# Patient Record
Sex: Female | Born: 1990 | Race: Black or African American | Hispanic: No | Marital: Married | Smoking: Never smoker
Health system: Southern US, Community
[De-identification: ages and names within clinical notes are randomized; demographics above are authoritative.]

## PROBLEM LIST (undated history)

## (undated) DIAGNOSIS — F329 Major depressive disorder, single episode, unspecified: Secondary | ICD-10-CM

## (undated) DIAGNOSIS — F909 Attention-deficit hyperactivity disorder, unspecified type: Secondary | ICD-10-CM

## (undated) DIAGNOSIS — F419 Anxiety disorder, unspecified: Secondary | ICD-10-CM

## (undated) DIAGNOSIS — Z9889 Other specified postprocedural states: Secondary | ICD-10-CM

## (undated) DIAGNOSIS — Z8489 Family history of other specified conditions: Secondary | ICD-10-CM

## (undated) DIAGNOSIS — Z6841 Body Mass Index (BMI) 40.0 and over, adult: Secondary | ICD-10-CM

## (undated) DIAGNOSIS — F32A Depression, unspecified: Secondary | ICD-10-CM

## (undated) DIAGNOSIS — R519 Headache, unspecified: Secondary | ICD-10-CM

## (undated) DIAGNOSIS — R112 Nausea with vomiting, unspecified: Secondary | ICD-10-CM

## (undated) DIAGNOSIS — T8859XA Other complications of anesthesia, initial encounter: Secondary | ICD-10-CM

## (undated) DIAGNOSIS — K589 Irritable bowel syndrome without diarrhea: Secondary | ICD-10-CM

## (undated) DIAGNOSIS — R51 Headache: Secondary | ICD-10-CM

## (undated) HISTORY — DX: Attention-deficit hyperactivity disorder, unspecified type: F90.9

---
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
## 2012-08-31 DIAGNOSIS — Z6791 Unspecified blood type, Rh negative: Secondary | ICD-10-CM | POA: Insufficient documentation

## 2012-12-21 ENCOUNTER — Inpatient Hospital Stay: Payer: Self-pay | Admitting: Obstetrics and Gynecology

## 2012-12-21 LAB — CBC WITH DIFFERENTIAL/PLATELET
Basophil #: 0.1 10*3/uL (ref 0.0–0.1)
Basophil %: 1.2 %
Eosinophil #: 0.1 10*3/uL (ref 0.0–0.7)
Eosinophil %: 0.5 %
HCT: 37 % (ref 35.0–47.0)
HGB: 13 g/dL (ref 12.0–16.0)
Lymphocyte #: 2.5 10*3/uL (ref 1.0–3.6)
Lymphocyte %: 23.8 %
MCH: 32.6 pg (ref 26.0–34.0)
MCHC: 35 g/dL (ref 32.0–36.0)
MCV: 93 fL (ref 80–100)
Monocyte #: 1.4 x10 3/mm — ABNORMAL HIGH (ref 0.2–0.9)
Monocyte %: 13.1 %
Neutrophil #: 6.4 10*3/uL (ref 1.4–6.5)
Neutrophil %: 61.4 %
Platelet: 271 10*3/uL (ref 150–440)
RBC: 3.98 10*6/uL (ref 3.80–5.20)
RDW: 15 % — ABNORMAL HIGH (ref 11.5–14.5)
WBC: 10.4 10*3/uL (ref 3.6–11.0)

## 2012-12-21 LAB — GC/CHLAMYDIA PROBE AMP

## 2012-12-23 LAB — COMPREHENSIVE METABOLIC PANEL
Alkaline Phosphatase: 171 U/L — ABNORMAL HIGH (ref 50–136)
Anion Gap: 6 — ABNORMAL LOW (ref 7–16)
BUN: 9 mg/dL (ref 7–18)
EGFR (Non-African Amer.): >60
Glucose: 72 mg/dL (ref 65–99)
Osmolality: 273 (ref 275–301)
Potassium: 4.1 mmol/L (ref 3.5–5.1)
SGOT(AST): 20 U/L (ref 15–37)
SGPT (ALT): 18 U/L (ref 12–78)
Sodium: 138 mmol/L (ref 136–145)
Total Protein: 5.1 g/dL — ABNORMAL LOW (ref 6.4–8.2)

## 2012-12-23 LAB — CBC WITH DIFFERENTIAL/PLATELET
Basophil %: 0.5 %
Eosinophil #: 0 10*3/uL (ref 0.0–0.7)
Eosinophil %: 0.3 %
HCT: 32.1 % — ABNORMAL LOW (ref 35.0–47.0)
Lymphocyte #: 1.9 10*3/uL (ref 1.0–3.6)
Lymphocyte %: 12.8 %
MCH: 31.9 pg (ref 26.0–34.0)
MCV: 94 fL (ref 80–100)
Monocyte #: 1.4 x10 3/mm — ABNORMAL HIGH (ref 0.2–0.9)
Monocyte %: 9.5 %
Neutrophil #: 11.3 10*3/uL — ABNORMAL HIGH (ref 1.4–6.5)
Neutrophil %: 76.9 %
Platelet: 240 10*3/uL (ref 150–440)
RBC: 3.41 10*6/uL — ABNORMAL LOW (ref 3.80–5.20)
RDW: 15.1 % — ABNORMAL HIGH (ref 11.5–14.5)

## 2012-12-25 LAB — CREATININE, SERUM: Creatinine: 0.86 mg/dL (ref 0.60–1.30)

## 2013-02-04 HISTORY — PX: OTHER SURGICAL HISTORY: SHX169

## 2013-03-24 ENCOUNTER — Encounter: Payer: Self-pay | Admitting: Family Medicine

## 2013-06-06 ENCOUNTER — Emergency Department: Payer: Self-pay | Admitting: Emergency Medicine

## 2013-11-08 ENCOUNTER — Emergency Department: Payer: Self-pay | Admitting: Emergency Medicine

## 2014-08-27 NOTE — Consult Note (Signed)
Brief Consult Note: Diagnosis: adjustment disorder.   Patient was seen by consultant.   Consult note dictated.   Comments: Psychiatry: Patient seen and chart reviewed. Patient's cheif complaint is of pain which is her stated reason for not being more active. Denies any psychiatric symptoms. No sign of dangerousness. No specific indication for treatment.  Electronic Signatures: Audery Amellapacs, Loyde Orth T (MD)  (Signed 20-Aug-14 17:41)  Authored: Brief Consult Note   Last Updated: 20-Aug-14 17:41 by Audery Amellapacs, Gurley Climer T (MD)

## 2014-08-27 NOTE — Op Note (Signed)
PATIENT NAME:  Debra Mcdaniel, Lashonda N MR#:  [id number] DATE OF BIRTH:  08-12-1990  DATE OF PROCEDURE:  12/22/2012  PREOPERATIVE DIAGNOSES:   1.  Term pregnancy.  2.  Failure to progress secondary to cephalopelvic disproportion.   POSTOPERATIVE DIAGNOSES: 1.  Term pregnancy.  2.  Failure to progress secondary to cephalopelvic disproportion.   PROCEDURE PERFORMED: Low transverse cesarean section.   SURGEON: Annamarie MajorPaul Molleigh Huot, M.D.   ASSISTANT: Kandice HamsAmanda Ore, technician.   ANESTHESIA: Epidural.   ESTIMATED BLOOD LOSS: 700 mL.   COMPLICATIONS: None.   FINDINGS: Normal tubes, ovaries, and uterus. Viable female infant weighing 8 pounds, 10 ounces with Apgar scores of 8 and 9 at one minute and five minutes respectively.   DISPOSITION: To the recovery room in stable condition.   TECHNIQUE: The patient was prepped and draped in the usual sterile fashion after adequate anesthesia is obtained in the supine position on the operating room table. Scalpel was used to create a low transverse skin incision down to the level of the rectus fascia, which is dissected bilaterally using Mayo scissors. The rectus muscles are dissected away from the rectus fascia and are separated in the midline with the peritoneum dissected and the bladder inferiorly retracted. A scalpel was used to create a low transverse hysterotomy incision that is extended by blunt dissection. Amniotomy reveals clear fluid. The head is delivered without complication and without the use of a vacuum device. The oropharynx is suctioned and the remaining infant is delivered completely.   Cord blood is obtained. The placenta is manually extracted. The uterus is externalized and cleansed of all membranes and debris using a moist sponge. The hysterotomy incision is closed with a running #1 Vicryl suture in a locking fashion followed by a second layer to imbricate the first layer to obtain excellent hemostasis.   The uterus was placed back in the  intra-abdominal cavity and the paracolic gutters are irrigated using warm saline. Re-examination of the incision reveals excellent hemostasis and Interceed is placed over the scar. The peritoneum is closed and then trocars are placed through the abdomen into the subfascial space for placement of the On-Q pain pump catheters. They are threaded through these trocars into place. The fascia is closed with Maxon suture in a running fashion with careful placement of sutures to not incorporate the On-Q pain pump catheters. Subcutaneous tissues are irrigated and hemostasis is assured using electrocautery. Skin is closed with surgical clips. Bandages are applied. The catheters are flushed with 5 mL each of bupivacaine and stabilized in place with Steri-Strips and a Tegaderm bandage. The patient goes to the recovery room in stable condition. All sponge, instrument and needle counts are correct.   ____________________________ R. Annamarie MajorPaul Dartagnan Beavers, MD rph:aw D: 12/22/2012 04:37:46 ET T: 12/22/2012 07:45:49 ET JOB#: [id number]  cc: Dierdre Searles. Paul Tryce Surratt, MD, <Dictator> Nadara MustardOBERT P Matea Stanard MD ELECTRONICALLY SIGNED 12/26/2012 10:32

## 2014-08-27 NOTE — Consult Note (Signed)
PATIENT NAME:  Debra Mcdaniel, Debra Mcdaniel MR#:  [id number] DATE OF BIRTH:  23-Apr-1991  DATE OF CONSULTATION:  12/24/2012  CONSULTING PHYSICIAN:  Audery AmelJohn T. Clapacs, MD  IDENTIFYING INFORMATION AND REASON FOR CONSULTATION: This is a 24 year old woman in the hospital to have a baby by cesarean section. The consultation was because of concern that she is not being active either in her exercise or in holding the baby.   HISTORY OF PRESENT ILLNESS: I received a call this morning from one of the midwives requesting a consultation on this patient. There was stated concern that the patient has not been getting out of bed and not moving around the way that was recommended to her. There was concern that the patient had not been making any effort to hold her newborn baby. On interview today, the patient denies any psychiatric symptoms. She states that her mood is feeling okay, but that her chief complaint is pain. She says that she finds the pain in the area of her incision and also pain in her arm to be so much that she can hardly bear it at times. She gives that as a reason why she has not been more active. She says that the first day after having the cesarean section, she felt like the pain around the incision was so bad that she simply could not get out of bed on her own. She also says that when she tried to hold the baby, her arm quickly tired out and started to hurt and so she felt that she could not hold the baby much longer. She states that otherwise, she would like to be able to hold her baby. She denies any symptoms or feeling of depression. States that her mood is generally upbeat. Denies any anxiety symptoms. Denies any psychotic symptoms. She says she is upset at being in the hospital and is upset that she has not been able to sleep well. Prior to coming into the hospital, however, she says that she was sleeping fairly well at home. 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 She had  continued to have an interest in her normal activities at home. She feels like she has good relationships with her family. She absolutely denies any suicidal ideation or homicidal ideation at all. She says that she was looking forward to having the baby and that she is glad that the baby is born. She is looking forward to trying to be a good mother for the baby.   PAST PSYCHIATRIC HISTORY: The patient denies any prior psychiatric treatment or history whatsoever. Says that she has never seen even a counselor or therapist. Never been on any psychiatric medicine. Never been in a psychiatric hospital. Denies any history of suicide attempts or violent behavior in the past.   SOCIAL HISTORY: The patient is single and lives with her mother and grandmother, as well as a sister and the sister's child. This is the patient's first baby. The father of the baby is not involved in her life anymore. The patient is going to school for a cosmetology degree. She says she feels like she has good support from her family and other friends.   PAST MEDICAL HISTORY: Denies any significant ongoing medical problems. She specifically says that she has never been in a hospital before and never had any surgery before. She was not expecting to have to have a cesarean and feels like the surprise of it has made the experience even worse. She is  not on any medication.    ALLERGIES: No medication allergies listed.   FAMILY HISTORY: Denies any family history of mental health problems.   SUBSTANCE ABUSE HISTORY: The patient says that she drinks very little. She says that she has never had a problem with drinking and does not use any kind of recreational drugs. Denies any history of substance abuse problems in the past.   MENTAL STATUS EXAMINATION: This is a 24 year old woman who just had a cesarean section the day before yesterday. She was interviewed in the room. The baby was in the room also in a bassinet underneath an ultraviolet  light. The patient was clearly aware that the baby was there. The patient was alert, oriented and cooperative with the interview. She did not express any irritability or anger about the consultation. She was interviewed on her own. Eye contact was appropriate. Psychomotor activity was appropriate given her stated degree of discomfort. Speech was of normal rate, tone and volume. Affect appeared to be full range, reactive and appropriate. Mood was stated as being tired. Thoughts appeared to be lucid. There was no sign of odd or bizarre thinking or loosening of associations. No evidence of any delusions or paranoia. Denied hallucinations. Totally denied suicidal ideation. Denied homicidal ideation and denied any thought of wanting to hurt the baby. The patient appeared to be of probably normal intelligence. Was alert and oriented x 4.   LABORATORY RESULTS: Laboratory work-up includes an RPR which is negative, a work-up for Chlamydia and gonorrhea which is negative. She does not have a drug screen that was checked. Nothing else relevant to the psychiatric condition.   ASSESSMENT: This is a 24 year old woman who had a baby 2 days ago. This is her first child. She has no past psychiatric history. The treatment team has concern that she is not being active enough in recovering her strength and not making more of an effort to be with the baby. On interview today, the patient's stated reason for all of this is that she has been in pain. She says that today the pain is a little better than yesterday and that she has been able to get out of bed and go to the bathroom. She tells me that she in general cannot tolerate pain very well and that she thinks she has a particularly low tolerance for it. On interview today, I do not see any evidence of mood disorder. No evidence of psychotic disorder. No evidence of anxiety disorder. We do not have any evidence of substance abuse problems. She appears to be of normal range  intelligence. I do not see anything to indicate any maliciousness. It is possible that this is just the patient's personality or developmental level, but I do not see any evidence of any treatable psychiatric problem.   TREATMENT PLAN: No indication for specific psychiatric treatment. Recommend particular attention to education and encouragement for the patient, particularly would suggest that she be offered as much support by whatever resources are available during her hospitalization and after discharge.   DIAGNOSIS, PRINCIPAL AND PRIMARY:  AXIS I: Adjustment disorder, not otherwise specified.   SECONDARY DIAGNOSES: AXIS I: No further diagnosis.  AXIS II: No diagnosis.  AXIS III: Status post birth of a child. Also getting treatment for an infection. Anemia.  AXIS IV: Moderate to severe from being a single mother, decreased overall support.  AXIS V: Functioning at time of evaluation: 66.   ____________________________ Audery Amel, MD jtc:jm D: 12/24/2012 17:53:02 ET T: 12/24/2012  21:02:38 ET JOB#: [id number]  cc: Audery Amel, MD, <Dictator> Audery Amel MD ELECTRONICALLY SIGNED 12/24/2012 23:22

## 2014-09-14 NOTE — H&P (Signed)
L&D Evaluation:  History Expanded:  HPI Patient presents to Labor and Delivery this afternoon after reporting that her water broke around 9am this morning. Patient reports contractions that are getting more intense. Denies vaginal bleeding, +  fetal movement.   Gravida 1   Term 0   PreTerm 0   Abortion 0   Living 0   Blood Type (Maternal) O negative   Group B Strep Results Maternal (Result >5wks must be treated as unknown) negative   Maternal HIV Negative   Maternal Syphilis Ab Nonreactive   Maternal Varicella Immune   Rubella Results (Maternal) immune   Maternal T-Dap Immune   [facility]   Presents with contractions, leaking fluid   Patient's Medical History No Chronic Illness   Patient's Surgical History none   Medications Pre Natal Vitamins   Allergies NKDA   Social History none   Family History other  hypothyroidism, endometriosis   Current Prenatal Course Notable For late to care, polyhydramnios at 36 weeks   ROS:  ROS All systems were reviewed.  HEENT, CNS, GI, GU, Respiratory, CV, Renal and Musculoskeletal systems were found to be normal.   Exam:  Vital Signs stable   General no apparent distress   Mental Status clear   Chest clear   Heart normal sinus rhythm   Abdomen gravid, non-tender   Edema 1+   Pelvic 2-3/70/-3   Mebranes Ruptured, at 9am 12-21-12/ nitrazine and fern positive   Description clear   FHT normal rate with no decels   Fetal Heart Rate 150   Ucx irregular   Skin dry   Impression:  Impression early labor with spontaneous rupture of membranes   Plan:  Plan monitor contractions and for cervical change, fluids   Comments Start pitocin for augmentation  epidural for pain relief as desired Anticipate spontaneous vaginal delivery.   Electronic Signatures: Letitia LibraHarris, Kaeli Nichelson Paul (MD)  (Signed 17-Aug-14 15:19)  Authored: L&D Evaluation   Last Updated: 17-Aug-14 15:19 by Letitia LibraHarris, Itsel Opfer Paul (MD)

## 2016-10-04 ENCOUNTER — Ambulatory Visit (INDEPENDENT_AMBULATORY_CARE_PROVIDER_SITE_OTHER): Payer: Managed Care, Other (non HMO) | Admitting: Obstetrics and Gynecology

## 2016-10-04 ENCOUNTER — Encounter: Payer: Self-pay | Admitting: Obstetrics and Gynecology

## 2016-10-04 VITALS — BP 132/70 | HR 100 | Ht 62.0 in | Wt 287.0 lb

## 2016-10-04 DIAGNOSIS — Z01419 Encounter for gynecological examination (general) (routine) without abnormal findings: Secondary | ICD-10-CM

## 2016-10-04 DIAGNOSIS — Z124 Encounter for screening for malignant neoplasm of cervix: Secondary | ICD-10-CM

## 2016-10-04 DIAGNOSIS — Z113 Encounter for screening for infections with a predominantly sexual mode of transmission: Secondary | ICD-10-CM

## 2016-10-04 DIAGNOSIS — Z30432 Encounter for removal of intrauterine contraceptive device: Secondary | ICD-10-CM

## 2016-10-04 NOTE — Progress Notes (Signed)
Chief Complaint  Patient presents with  . Gynecologic Exam  . IUD removal     HPI:      Debra Mcdaniel is a 26 y.o. G1P1001 who LMP was Patient's last menstrual period was 09/11/2016 (exact date)., presents today for her annual examination.  Her menses are mostly regular every 28-30 days, lasting 7 days and light flow.  Dysmenorrhea mild, occurring first 1-2 days of flow. She does not have intermenstrual bleeding.  Sex activity: single partner, contraception - IUD. Mirena placed 10/14. Wants it removed today. Last Pap: February 05, 2013  Results were: no abnormalities Hx of STDs: none  There is no FH of breast cancer. There is no FH of ovarian cancer. The patient does do self-breast exams.  Tobacco use: The patient denies current or previous tobacco use. Alcohol use: social drinker Exercise: moderately active  She does not get adequate calcium and Vitamin D in her diet.   History reviewed. No pertinent past medical history.  Past Surgical History:  Procedure Laterality Date  . MIrena   02/2013    Family History  Problem Relation Age of Onset  . Hypertension Mother   . Hypothyroidism Mother   . Hypertension Maternal Grandmother   . Hypertension Maternal Grandfather   . Hypertension Paternal Grandmother   . Hypertension Paternal Grandfather     Social History   Social History  . Marital status: Single    Spouse name: N/A  . Number of children: N/A  . Years of education: N/A   Occupational History  . Not on file.   Social History Main Topics  . Smoking status: Never Smoker  . Smokeless tobacco: Never Used  . Alcohol use Yes     Comment: occ  . Drug use: No  . Sexual activity: Yes    Birth control/ protection: IUD   Other Topics Concern  . Not on file   Social History Narrative  . No narrative on file    No current outpatient prescriptions on file.  ROS:  Review of Systems  Constitutional: Negative for fatigue, fever and unexpected weight  change.  Respiratory: Negative for cough, shortness of breath and wheezing.   Cardiovascular: Negative for chest pain, palpitations and leg swelling.  Gastrointestinal: Negative for blood in stool, constipation, diarrhea, nausea and vomiting.  Endocrine: Negative for cold intolerance, heat intolerance and polyuria.  Genitourinary: Negative for dyspareunia, dysuria, flank pain, frequency, genital sores, hematuria, menstrual problem, pelvic pain, urgency, vaginal bleeding, vaginal discharge and vaginal pain.  Musculoskeletal: Negative for back pain, joint swelling and myalgias.  Skin: Negative for rash.  Neurological: Negative for dizziness, syncope, light-headedness, numbness and headaches.  Hematological: Negative for adenopathy.  Psychiatric/Behavioral: Negative for agitation, confusion, sleep disturbance and suicidal ideas. The patient is nervous/anxious.      Objective: BP 132/70 (BP Location: Left Arm, Patient Position: Sitting, Cuff Size: Large)   Pulse 100   Ht 5\' 2"  (1.575 m)   Wt 287 lb (130.2 kg)   LMP 09/11/2016 (Exact Date)   BMI 52.49 kg/m    Physical Exam  Constitutional: She is oriented to person, place, and time. She appears well-developed and well-nourished.  Genitourinary: Vagina normal and uterus normal. There is no rash or tenderness on the right labia. There is no rash or tenderness on the left labia. No erythema or tenderness in the vagina. No vaginal discharge found. Right adnexum does not display mass and does not display tenderness. Left adnexum does not display mass and does  not display tenderness.  Cervix exhibits visible IUD strings. Cervix does not exhibit motion tenderness or polyp. Uterus is not enlarged or tender.  Neck: Normal range of motion. No thyromegaly present.  Cardiovascular: Normal rate, regular rhythm and normal heart sounds.   No murmur heard. Pulmonary/Chest: Effort normal and breath sounds normal. Right breast exhibits no mass, no nipple  discharge, no skin change and no tenderness. Left breast exhibits no mass, no nipple discharge, no skin change and no tenderness.  Abdominal: Soft. There is no tenderness. There is no guarding.  Musculoskeletal: Normal range of motion.  Neurological: She is alert and oriented to person, place, and time. No cranial nerve deficit.  Psychiatric: She has a normal mood and affect. Her behavior is normal.  Vitals reviewed.   Assessment/Plan: Encounter for annual routine gynecological examination  Cervical cancer screening - Plan: IGP,CtNgTv,rfx Aptima HPV ASCU  Screening for STD (sexually transmitted disease) - Plan: IGP,CtNgTv,rfx Aptima HPV ASCU  Encounter for removal of intrauterine contraceptive device (IUD) - IUD removed. Pt wants to use condoms in the meantime.             GYN counsel family planning choices, adequate intake of calcium and vitamin D     F/U  Return in about 1 year (around 10/04/2017).  Alicia B. Copland, PA-C 10/04/2016 9:21 AM     IUD REMOVAL PROCEDURE   History of Present Illness:  Debra Mcdaniel is a 26 y.o. that had a Mirena IUD placed approximately 4 years ago. Since that time, she has had irreg bleeding, anxiety/depression sx and wants it removed. She plans to use condoms only in the meantime.   BP 132/70 (BP Location: Left Arm, Patient Position: Sitting, Cuff Size: Large)   Pulse 100   Ht 5\' 2"  (1.575 m)   Wt 287 lb (130.2 kg)   LMP 09/11/2016 (Exact Date)   BMI 52.49 kg/m   Pelvic exam:  Two IUD strings present seen coming from the cervical os. EGBUS, vaginal vault and cervix: within normal limits  IUD Removal Strings of IUD identified and grasped.  IUD removed without problem with ring forceps.  Pt tolerated this well.  IUD noted to be intact.  Assessment:  IUD Removal   Plan: IUD removed and plan for contraception is condoms. She was amenable to this plan.  Alicia B. Copland, PA-C 10/04/2016 9:21 AM

## 2016-10-07 LAB — IGP,CTNGTV,RFX APTIMA HPV ASCU
CHLAMYDIA, NUC. ACID AMP: NEGATIVE
Gonococcus, Nuc. Acid Amp: NEGATIVE
PAP Smear Comment: 0
Trich vag by NAA: NEGATIVE

## 2016-10-08 ENCOUNTER — Encounter: Payer: Self-pay | Admitting: Obstetrics and Gynecology

## 2016-10-09 ENCOUNTER — Telehealth: Payer: Self-pay

## 2016-10-09 NOTE — Telephone Encounter (Signed)
Spoke w/pt. She started spotting soon after having IUD removed & then had what she believes is her cycle w/clots. 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 Pt has felt dizzy and has had chills, body aches at times. Questioned whether she is having any other s&s of sinus/head cold etc. Pt uncertain states she could be coming down w/something. Advised to monitor bleeding & report if having to change pad more than 1x qhr or having extreme pelvic pain. [facility] & contact pt w/further advice.

## 2016-10-09 NOTE — Telephone Encounter (Signed)
Pt has some post procedure questions about Mirena removal she just had done by [facility]. Cb#[phone]

## 2016-10-09 NOTE — Telephone Encounter (Signed)
[facility] Follow expect[facility]. Doubt chills, body aches, dizziness related. RN to discuss with pt.

## 2016-10-10 NOTE — Telephone Encounter (Signed)
LMTC.  Pt aware.

## 2016-10-15 ENCOUNTER — Ambulatory Visit: Payer: Self-pay | Admitting: Obstetrics & Gynecology

## 2017-05-17 DIAGNOSIS — K580 Irritable bowel syndrome with diarrhea: Secondary | ICD-10-CM | POA: Insufficient documentation

## 2017-06-14 ENCOUNTER — Other Ambulatory Visit: Payer: Self-pay | Admitting: Nurse Practitioner

## 2017-06-14 DIAGNOSIS — G43719 Chronic migraine without aura, intractable, without status migrainosus: Secondary | ICD-10-CM

## 2017-06-16 ENCOUNTER — Emergency Department
Admission: EM | Admit: 2017-06-16 | Discharge: 2017-06-16 | Disposition: A | Discharge status: Home / Self Care | Payer: Managed Care, Other (non HMO) | Attending: Emergency Medicine | Admitting: Emergency Medicine

## 2017-06-16 ENCOUNTER — Other Ambulatory Visit: Payer: Self-pay

## 2017-06-16 DIAGNOSIS — X58XXXA Exposure to other specified factors, initial encounter: Secondary | ICD-10-CM | POA: Diagnosis not present

## 2017-06-16 DIAGNOSIS — Y929 Unspecified place or not applicable: Secondary | ICD-10-CM | POA: Insufficient documentation

## 2017-06-16 DIAGNOSIS — Y939 Activity, unspecified: Secondary | ICD-10-CM | POA: Insufficient documentation

## 2017-06-16 DIAGNOSIS — S39012A Strain of muscle, fascia and tendon of lower back, initial encounter: Secondary | ICD-10-CM | POA: Diagnosis not present

## 2017-06-16 DIAGNOSIS — S3992XA Unspecified injury of lower back, initial encounter: Secondary | ICD-10-CM | POA: Diagnosis present

## 2017-06-16 DIAGNOSIS — Y999 Unspecified external cause status: Secondary | ICD-10-CM | POA: Insufficient documentation

## 2017-06-16 MED ORDER — CYCLOBENZAPRINE HCL 5 MG PO TABS: 5.0000 mg | ORAL_TABLET | Freq: Three times a day (TID) | ORAL | 0 refills | Status: DC | PRN

## 2017-06-16 MED ORDER — DEXAMETHASONE SODIUM PHOSPHATE 10 MG/ML IJ SOLN
10.0000 mg | Freq: Once | INTRAMUSCULAR | Status: AC
  Administered 2017-06-16: 10 mg via INTRAMUSCULAR
  Filled 2017-06-16: qty 1

## 2017-06-16 MED ORDER — KETOROLAC TROMETHAMINE 30 MG/ML IJ SOLN
30.0000 mg | Freq: Once | INTRAMUSCULAR | Status: AC
  Administered 2017-06-16: 30 mg via INTRAMUSCULAR
  Filled 2017-06-16: qty 1

## 2017-06-16 MED ORDER — CYCLOBENZAPRINE HCL 10 MG PO TABS
10.0000 mg | ORAL_TABLET | Freq: Once | ORAL | Status: AC
  Administered 2017-06-16: 10 mg via ORAL
  Filled 2017-06-16: qty 1

## 2017-06-16 MED ORDER — PREDNISONE 10 MG PO TABS: 10.0000 mg | ORAL_TABLET | Freq: Every day | ORAL | 0 refills | Status: DC

## 2017-06-16 MED ORDER — TRAMADOL HCL 50 MG PO TABS: 50.0000 mg | ORAL_TABLET | Freq: Four times a day (QID) | ORAL | 0 refills | Status: DC | PRN

## 2017-06-16 NOTE — ED Provider Notes (Signed)
[facility] EMERGENCY DEPARTMENT Provider Note   CSN: [id number] Arrival date & time: 06/16/17  1304     History   Chief Complaint Chief Complaint  Patient presents with  . Back Pain    HPI Debra Mcdaniel is a 27 y.o. female presents to the emergency department for evaluation of acute lower back pain.  Symptoms have been present since yesterday.  No trauma or injury.  She describes sharp pulling pain along the left and right side of her lower lumbar spine after bending.  She states her back locked up.  She is been taken Tylenol as needed for pain.  Pain is 6 out of 10 with sitting and 9 out of 10 with movement.  She denies any numbness tingling or radicular symptoms.  No loss of bowel or bladder symptoms.  She is ambulatory with no assistive devices but has an antalgic gait.  No history of back troubles.  No urinary symptoms, fevers, nausea vomiting or abdominal pain.  HPI  History reviewed. No pertinent past medical history.  There are no active problems to display for this patient.   History reviewed. No pertinent surgical history.  OB History    No data available       Home Medications    Prior to Admission medications   Medication Sig Start Date End Date Taking? Authorizing Provider  cyclobenzaprine (FLEXERIL) 5 MG tablet Take 1-2 tablets (5-10 mg total) by mouth 3 (three) times daily as needed for muscle spasms. 06/16/17   Evon Slack, PA-C  predniSONE (DELTASONE) 10 MG tablet Take 1 tablet (10 mg total) by mouth daily. 6,5,4,3,2,1 six day taper 06/16/17   Evon Slack, PA-C  traMADol (ULTRAM) 50 MG tablet Take 1 tablet (50 mg total) by mouth every 6 (six) hours as needed. 06/16/17   Evon Slack, PA-C    Family History No family history on file.  Social History Social History   Tobacco Use  . Smoking status: Never Smoker  . Smokeless tobacco: Never Used  Substance Use Topics  . Alcohol use: No    Frequency: Never  . Drug  use: No     Allergies   Patient has no known allergies.   Review of Systems Review of Systems  Constitutional: Negative for activity change, chills, fatigue and fever.  Eyes: Negative for visual disturbance.  Respiratory: Negative for cough, chest tightness and shortness of breath.   Cardiovascular: Negative for chest pain and leg swelling.  Gastrointestinal: Negative for abdominal pain, nausea and vomiting.  Genitourinary: Negative for dysuria.  Musculoskeletal: Positive for back pain and myalgias. Negative for arthralgias and gait problem.  Skin: Negative for rash.  Neurological: Negative for weakness, numbness and headaches.  Hematological: Negative for adenopathy.  Psychiatric/Behavioral: Negative for agitation, behavioral problems and confusion.     Physical Exam Updated Vital Signs BP 128/80 (BP Location: Left Arm)   Pulse 96   Temp 98.4 F (36.9 C) (Oral)   Ht 5\' 1"  (1.549 m)   Wt (!) 143.8 kg (317 lb)   LMP 05/20/2017   SpO2 99%   BMI 59.90 kg/m   Physical Exam  Constitutional: She is oriented to person, place, and time. She appears well-developed and well-nourished.  HENT:  Head: Normocephalic and atraumatic.  Eyes: Conjunctivae are normal.  Neck: Normal range of motion.  Cardiovascular: Normal rate.  Pulmonary/Chest: Effort normal. No respiratory distress.  Musculoskeletal:  Examination lumbar spine shows patient has no spinous process tenderness.  She has  left and right paravertebral muscle tenderness of the lumbar spine.  She has full range of motion of bilateral lower extremities of the hips knees and ankles with no discomfort.  She is able to stand with mild antalgic gait.  Ambulation significantly improved with Toradol, dexamethasone  Neurological: She is alert and oriented to person, place, and time.  Skin: Skin is warm. No rash noted.  Psychiatric: She has a normal mood and affect. Her behavior is normal. Thought content normal.     ED Treatments  / Results  Labs (all labs ordered are listed, but only abnormal results are displayed) Labs Reviewed - No data to display  EKG  EKG Interpretation None       Radiology No results found.  Procedures Procedures (including critical care time)  Medications Ordered in ED Medications  ketorolac (TORADOL) 30 MG/ML injection 30 mg (30 mg Intramuscular Given 06/16/17 1515)  dexamethasone (DECADRON) injection 10 mg (10 mg Intramuscular Given 06/16/17 1514)  cyclobenzaprine (FLEXERIL) tablet 10 mg (10 mg Oral Given 06/16/17 1513)     Initial Impression / Assessment and Plan / ED Course  I have reviewed the triage vital signs and the nursing notes.  Pertinent labs & imaging results that were available during my care of the patient were reviewed by me and considered in my medical decision making (see chart for details).     27 year old female with acute lower back pain.  No numbness tingling or radicular symptoms.  No neurological deficits.  Signs and symptoms consistent with lumbar muscular strain.  Recommend patient continue with Tylenol.  She is given prescription for Flexeril, tramadol and a 6-day steroid taper.  She will follow-up with orthopedics if no improvement in 1 week.  Final Clinical Impressions(s) / ED Diagnoses   Final diagnoses:  Strain of lumbar region, initial encounter    ED Discharge Orders        Ordered    traMADol (ULTRAM) 50 MG tablet  Every 6 hours PRN     06/16/17 1558    cyclobenzaprine (FLEXERIL) 5 MG tablet  3 times daily PRN     06/16/17 1558    predniSONE (DELTASONE) 10 MG tablet  Daily     06/16/17 1558       Ronnette JuniperGaines, Aldrick Derrig C, PA-C 06/16/17 1632    Merrily Brittleifenbark, Neil, MD 06/17/17 [phone]

## 2017-06-16 NOTE — Discharge Instructions (Signed)
Please avoid any heavy lifting pushing or pulling.  If any fevers, increasing pain numbness tingling or radicular symptoms return to the ED.  Follow-up with orthopedics if no improvement in 1 week.

## 2017-06-16 NOTE — ED Notes (Signed)

## 2017-06-16 NOTE — ED Triage Notes (Signed)
Pt c/o lower back pain since lifting his son from the bath tub last night

## 2017-06-17 ENCOUNTER — Encounter: Payer: Self-pay | Admitting: Obstetrics and Gynecology

## 2017-06-26 ENCOUNTER — Encounter (INDEPENDENT_AMBULATORY_CARE_PROVIDER_SITE_OTHER): Payer: Self-pay

## 2017-06-26 ENCOUNTER — Ambulatory Visit
Admission: RE | Admit: 2017-06-26 | Discharge: 2017-06-26 | Disposition: A | Discharge status: Home / Self Care | Payer: Managed Care, Other (non HMO) | Source: Ambulatory Visit | Attending: Nurse Practitioner | Admitting: Nurse Practitioner

## 2017-06-26 DIAGNOSIS — G43719 Chronic migraine without aura, intractable, without status migrainosus: Secondary | ICD-10-CM | POA: Diagnosis present

## 2017-06-26 MED ORDER — GADOBENATE DIMEGLUMINE 529 MG/ML IV SOLN
20.0000 mL | Freq: Once | INTRAVENOUS | Status: AC | PRN
  Administered 2017-06-26: 20 mL via INTRAVENOUS

## 2017-06-28 ENCOUNTER — Other Ambulatory Visit
Admission: RE | Admit: 2017-06-28 | Discharge: 2017-06-28 | Disposition: A | Discharge status: Home / Self Care | Payer: Managed Care, Other (non HMO) | Source: Ambulatory Visit | Attending: Student | Admitting: Student

## 2017-06-28 DIAGNOSIS — G8929 Other chronic pain: Secondary | ICD-10-CM | POA: Insufficient documentation

## 2017-06-28 DIAGNOSIS — R109 Unspecified abdominal pain: Secondary | ICD-10-CM | POA: Diagnosis present

## 2017-06-28 LAB — GASTROINTESTINAL PANEL BY PCR, STOOL (REPLACES STOOL CULTURE)
ADENOVIRUS F40/41: NOT DETECTED
Astrovirus: NOT DETECTED
Campylobacter species: NOT DETECTED
Cryptosporidium: NOT DETECTED
Cyclospora cayetanensis: NOT DETECTED
ENTEROAGGREGATIVE E COLI (EAEC): NOT DETECTED
ENTEROPATHOGENIC E COLI (EPEC): NOT DETECTED
ENTEROTOXIGENIC E COLI (ETEC): NOT DETECTED
Entamoeba histolytica: NOT DETECTED
GIARDIA LAMBLIA: NOT DETECTED
NOROVIRUS GI/GII: NOT DETECTED
Plesimonas shigelloides: NOT DETECTED
ROTAVIRUS A: NOT DETECTED
SHIGELLA/ENTEROINVASIVE E COLI (EIEC): NOT DETECTED
Salmonella species: NOT DETECTED
Sapovirus (I, II, IV, and V): NOT DETECTED
Shiga like toxin producing E coli (STEC): NOT DETECTED
Vibrio cholerae: NOT DETECTED
Vibrio species: NOT DETECTED
Yersinia enterocolitica: NOT DETECTED

## 2017-06-28 LAB — C DIFFICILE QUICK SCREEN W PCR REFLEX
C DIFFICILE (CDIFF) TOXIN: NEGATIVE
C DIFFICLE (CDIFF) ANTIGEN: NEGATIVE
C Diff interpretation: NOT DETECTED

## 2017-06-29 LAB — H. PYLORI ANTIGEN, STOOL: H. Pylori Stool Ag, Eia: NEGATIVE

## 2017-07-01 LAB — PANCREATIC ELASTASE, FECAL

## 2017-07-02 LAB — CALPROTECTIN, FECAL: Calprotectin, Fecal: 31 ug/g (ref 0–120)

## 2017-07-23 ENCOUNTER — Encounter: Payer: Self-pay | Admitting: Student

## 2017-07-24 ENCOUNTER — Ambulatory Visit: Payer: Managed Care, Other (non HMO) | Admitting: Certified Registered"

## 2017-07-24 ENCOUNTER — Ambulatory Visit
Admission: RE | Admit: 2017-07-24 | Discharge: 2017-07-24 | Disposition: A | Discharge status: Home / Self Care | Payer: Managed Care, Other (non HMO) | Source: Ambulatory Visit | Attending: Internal Medicine | Admitting: Internal Medicine

## 2017-07-24 ENCOUNTER — Other Ambulatory Visit: Payer: Self-pay

## 2017-07-24 ENCOUNTER — Encounter
Admission: RE | Disposition: A | Discharge status: Home / Self Care | Payer: Self-pay | Source: Ambulatory Visit | Attending: Internal Medicine

## 2017-07-24 ENCOUNTER — Encounter: Payer: Self-pay | Admitting: *Deleted

## 2017-07-24 DIAGNOSIS — E669 Obesity, unspecified: Secondary | ICD-10-CM | POA: Diagnosis not present

## 2017-07-24 DIAGNOSIS — Z79899 Other long term (current) drug therapy: Secondary | ICD-10-CM | POA: Diagnosis not present

## 2017-07-24 DIAGNOSIS — K2950 Unspecified chronic gastritis without bleeding: Secondary | ICD-10-CM | POA: Diagnosis not present

## 2017-07-24 DIAGNOSIS — K589 Irritable bowel syndrome without diarrhea: Secondary | ICD-10-CM | POA: Insufficient documentation

## 2017-07-24 DIAGNOSIS — Z6841 Body Mass Index (BMI) 40.0 and over, adult: Secondary | ICD-10-CM | POA: Insufficient documentation

## 2017-07-24 DIAGNOSIS — R1030 Lower abdominal pain, unspecified: Secondary | ICD-10-CM | POA: Diagnosis not present

## 2017-07-24 DIAGNOSIS — F329 Major depressive disorder, single episode, unspecified: Secondary | ICD-10-CM | POA: Insufficient documentation

## 2017-07-24 DIAGNOSIS — Z91048 Other nonmedicinal substance allergy status: Secondary | ICD-10-CM | POA: Insufficient documentation

## 2017-07-24 DIAGNOSIS — R7982 Elevated C-reactive protein (CRP): Secondary | ICD-10-CM | POA: Insufficient documentation

## 2017-07-24 DIAGNOSIS — R194 Change in bowel habit: Secondary | ICD-10-CM | POA: Insufficient documentation

## 2017-07-24 HISTORY — DX: Body Mass Index (BMI) 40.0 and over, adult: Z684

## 2017-07-24 HISTORY — DX: Depression, unspecified: F32.A

## 2017-07-24 HISTORY — PX: ESOPHAGOGASTRODUODENOSCOPY (EGD) WITH PROPOFOL: SHX5813

## 2017-07-24 HISTORY — PX: COLONOSCOPY WITH PROPOFOL: SHX5780

## 2017-07-24 HISTORY — DX: Irritable bowel syndrome, unspecified: K58.9

## 2017-07-24 HISTORY — DX: Headache, unspecified: R51.9

## 2017-07-24 HISTORY — DX: Headache: R51

## 2017-07-24 HISTORY — DX: Major depressive disorder, single episode, unspecified: F32.9

## 2017-07-24 SURGERY — COLONOSCOPY WITH PROPOFOL
Anesthesia: General

## 2017-07-24 MED ORDER — PROPOFOL 10 MG/ML IV BOLUS
INTRAVENOUS | Status: AC
  Filled 2017-07-24: qty 20

## 2017-07-24 MED ORDER — GLYCOPYRROLATE 0.2 MG/ML IJ SOLN
INTRAMUSCULAR | Status: DC | PRN
  Administered 2017-07-24: 0.2 mg via INTRAVENOUS

## 2017-07-24 MED ORDER — MIDAZOLAM HCL 5 MG/5ML IJ SOLN
INTRAMUSCULAR | Status: DC | PRN
  Administered 2017-07-24: 2 mg via INTRAVENOUS

## 2017-07-24 MED ORDER — PROPOFOL 10 MG/ML IV BOLUS
INTRAVENOUS | Status: DC | PRN
  Administered 2017-07-24: 100 mg via INTRAVENOUS

## 2017-07-24 MED ORDER — MIDAZOLAM HCL 2 MG/2ML IJ SOLN
INTRAMUSCULAR | Status: AC
  Filled 2017-07-24: qty 2

## 2017-07-24 MED ORDER — SODIUM CHLORIDE 0.9 % IV SOLN
INTRAVENOUS | Status: DC
  Administered 2017-07-24: 09:00:00 via INTRAVENOUS

## 2017-07-24 MED ORDER — LIDOCAINE 2% (20 MG/ML) 5 ML SYRINGE
INTRAMUSCULAR | Status: DC | PRN
  Administered 2017-07-24: 25 mg via INTRAVENOUS

## 2017-07-24 MED ORDER — PROPOFOL 500 MG/50ML IV EMUL
INTRAVENOUS | Status: DC | PRN
  Administered 2017-07-24: 120 ug/kg/min via INTRAVENOUS

## 2017-07-24 NOTE — H&P (Signed)
  Outpatient short stay form Pre-procedure 07/24/2017 9:02 AM Maze Corniel K. Norma Fredricksonoledo, M.D.  Primary Physician: Barbette ReichmannVishwanath Hande, M.D.  Reason for visit:  Change in bowel habits, Abdominal pain  History of present illness: patient is a 27 year old African American female with several years history of abdominal bloating, alternating bowel habits between diarrhea and constipation and abdominal cramping. The patient says she does not have mucinous stools, however, has sensation of incomplete defecation, bloating and relief of lower abdominal cramping with defecation. There's been no weight loss, bleeding, fever, or other alarm symptoms. Patient denies a family history of colon cancer. The patient denies any acid reflux symptoms, dysphagia nausea or vomiting. Recent lab workup ruled out celiac disease, pancreatic insufficiency. Elevated CRP of 16 was noted. CBC and CMP were normal. PCR stool study was negative for infection.    Current Facility-Administered Medications:  .  0.9 %  sodium chloride infusion, , Intravenous, Continuous, Sahana Boyland, Boykin Nearingeodoro K, MD  Medications Prior to Admission  Medication Sig Dispense Refill Last Dose  . topiramate (TOPAMAX) 25 MG tablet Take 25 mg by mouth 2 (two) times daily.   Past Week at Unknown time  . traMADol (ULTRAM) 50 MG tablet Take 1 tablet (50 mg total) by mouth every 6 (six) hours as needed. 20 tablet 0 Past Week at Unknown time  . Vitamin D, Ergocalciferol, (DRISDOL) 50000 units CAPS capsule Take 50,000 Units by mouth every 7 (seven) days.   Past Week at Unknown time  . cyclobenzaprine (FLEXERIL) 5 MG tablet Take 1-2 tablets (5-10 mg total) by mouth 3 (three) times daily as needed for muscle spasms. (Patient not taking: Reported on 07/24/2017) 20 tablet 0 Not Taking at Unknown time  . predniSONE (DELTASONE) 10 MG tablet Take 1 tablet (10 mg total) by mouth daily. 6,5,4,3,2,1 six day taper (Patient not taking: Reported on 07/24/2017) 21 tablet 0 Completed Course at  Unknown time  . propranolol (INDERAL) 60 MG tablet Take 60 mg by mouth 3 (three) times daily.   Not Taking at Unknown time     Allergies  Allergen Reactions  . Nickel Rash     Past Medical History:  Diagnosis Date  . BMI 50.0-59.9, adult (HCC)   . Depression   . Headache   . IBS (irritable bowel syndrome)     Review of systems:      Physical Exam  Gen: Alert, oriented. Appears stated age.  HEENT: [place]/AT. PERRLA. Lungs: CTA, no wheezes. CV: RR nl S1, S2. Abd: soft, benign, no masses. BS+ Ext: No edema. Pulses 2+    Planned procedures: EGD and colonoscopy. The patient understands the nature of the planned procedure, indications, risks, alternatives and potential complications including but not limited to bleeding, infection, perforation, damage to internal organs and possible oversedation/side effects from anesthesia. The patient agrees and gives consent to proceed.  Please refer to procedure notes for findings, recommendations and patient disposition/instructions.    Kortnee Bas K. Norma Fredricksonoledo, M.D. Gastroenterology 07/24/2017  9:02 AM

## 2017-07-24 NOTE — Op Note (Signed)
[facility] Gastroenterology Patient Name: Debra Mcdaniel Procedure Date: 07/24/2017 8:58 AM MRN: [id number] Account #: [id number] Date of Birth: [birth year] Admit Type: Outpatient Age: 27 Room: [facility] ENDO ROOM 3 Gender: Female Note Status: Finalized Procedure:            Colonoscopy Indications:          Lower abdominal pain, Change in bowel habits Providers:            Boykin Nearing. Norma Fredrickson MD, MD Referring MD:         Barbette Reichmann, MD (Referring MD) Medicines:            Propofol per Anesthesia Complications:        No immediate complications. Procedure:            Pre-Anesthesia Assessment:                       - The risks and benefits of the procedure and the                        sedation options and risks were discussed with the                        patient. All questions were answered and informed                        consent was obtained.                       - Patient identification and proposed procedure were                        verified prior to the procedure by the nurse. The                        procedure was verified in the procedure room.                       After obtaining informed consent, the colonoscope was                        passed under direct vision. Throughout the procedure,                        the patient's blood pressure, pulse, and oxygen                        saturations were monitored continuously. The Olympus                        CF-H180AL colonoscope ( S#: [id number] ) was introduced                        through the anus and advanced to the the cecum,                        identified by appendiceal orifice and ileocecal valve.                        The colonoscopy was performed without difficulty. The  patient tolerated the procedure well. The quality of                        the bowel preparation was good. The terminal ileum,                        ileocecal valve, appendiceal orifice,  and rectum were                        photographed. Findings:      The perianal and digital rectal examinations were normal. Pertinent       negatives include normal sphincter tone and no palpable rectal lesions.      The ileum and colon (entire examined portion) appeared normal. Multiple       biopsies were obtained in the rectum, in the descending colon, in the       ascending colon and in the terminal ileum with cold forceps for       histology.      The exam was otherwise without abnormality on direct and retroflexion       views. Impression:           - The terminal ileum and entire examined colon are                        normal.                       - The examination was otherwise normal on direct and                        retroflexion views.                       - No specimens collected. Recommendation:       - Patient has a contact number available for                        emergencies. The signs and symptoms of potential                        delayed complications were discussed with the patient.                        Return to normal activities tomorrow. Written discharge                        instructions were provided to the patient.                       - Resume previous diet.                       - Continue present medications.                       - Await pathology results. Procedure Code(s):    --- Professional ---                       [id number], Colonoscopy, flexible; with biopsy, single or  multiple Diagnosis Code(s):    --- Professional ---                       R10.30, Lower abdominal pain, unspecified                       R19.4, Change in bowel habit CPT copyright 2016 American Medical Association. All rights reserved. The codes documented in this report are preliminary and upon coder review may  be revised to meet current compliance requirements. Stanton Kidney MD, MD 07/24/2017 9:45:05 AM This report has been signed  electronically. Number of Addenda: 0 Note Initiated On: 07/24/2017 8:58 AM Scope Withdrawal Time: 0 hours 9 minutes 49 seconds  Total Procedure Duration: 0 hours 13 minutes 5 seconds       [facility]

## 2017-07-24 NOTE — Op Note (Signed)
[facility] Gastroenterology Patient Name: Debra Mcdaniel Procedure Date: 07/24/2017 8:41 AM MRN: [id number] Account #: [id number] Date of Birth: 1990-12-28 Admit Type: Outpatient Age: 26 Room: [facility] ENDO ROOM 3 Gender: Female Note Status: Finalized Procedure:            Upper GI endoscopy Indications:          Lower abdominal pain Providers:            Boykin Nearing. Norma Fredrickson MD, MD Referring MD:         Barbette Reichmann, MD (Referring MD) Medicines:            Propofol per Anesthesia Complications:        No immediate complications. Procedure:            Pre-Anesthesia Assessment:                       - The risks and benefits of the procedure and the                        sedation options and risks were discussed with the                        patient. All questions were answered and informed                        consent was obtained.                       - Patient identification and proposed procedure were                        verified prior to the procedure by the nurse. The                        procedure was verified in the procedure room.                       - ASA Grade Assessment: II - A patient with mild                        systemic disease.                       - After reviewing the risks and benefits, the patient                        was deemed in satisfactory condition to undergo the                        procedure.                       After obtaining informed consent, the endoscope was                        passed under direct vision. Throughout the procedure,                        the patient's blood pressure, pulse, and oxygen  saturations were monitored continuously. The Endoscope                        was introduced through the mouth, and advanced to the                        third part of duodenum. The upper GI endoscopy was                        accomplished without difficulty. The patient tolerated                      the procedure well. Findings:      Non-occlusive esophageal "B" ring      Normal mucosa was found in the entire esophagus.      Localized mildly erythematous mucosa without bleeding was found on the       posterior wall of the gastric antrum. Biopsies were taken with a cold       forceps for Helicobacter pylori testing.      The cardia and gastric fundus were normal on retroflexion.      The examined duodenum was normal.      The exam was otherwise without abnormality. Impression:           - Normal mucosa was found in the entire esophagus.                       - Erythematous mucosa in the posterior wall of the                        gastric antrum. Biopsied.                       - Normal examined duodenum.                       - The examination was otherwise normal. Recommendation:       - Await pathology results.                       - Proceed with colonoscopy Procedure Code(s):    --- Professional ---                       [phone], Esophagogastroduodenoscopy, flexible, transoral;                        with biopsy, single or multiple Diagnosis Code(s):    --- Professional ---                       K31.89, Other diseases of stomach and duodenum                       R10.30, Lower abdominal pain, unspecified CPT copyright 2016 American Medical Association. All rights reserved. The codes documented in this report are preliminary and upon coder review may  be revised to meet current compliance requirements. Stanton Kidneyeodoro K Toledo MD, MD 07/24/2017 9:24:05 AM This report has been signed electronically. Number of Addenda: 0 Note Initiated On: 07/24/2017 8:41 AM      [facility]

## 2017-07-24 NOTE — Anesthesia Post-op Follow-up Note (Signed)
Anesthesia QCDR form completed.        

## 2017-07-24 NOTE — Interval H&P Note (Signed)
History and Physical Interval Note:  07/24/2017 9:12 AM  Baron SaneLatreacia Mcdaniel  has presented today for surgery, with the diagnosis of ABD PAIN ALT CONSTIPATION and DIARRHEA  The various methods of treatment have been discussed with the patient and family. After consideration of risks, benefits and other options for treatment, the patient has consented to  Procedure(s): COLONOSCOPY WITH PROPOFOL (N/A) ESOPHAGOGASTRODUODENOSCOPY (EGD) WITH PROPOFOL (N/A) as a surgical intervention .  The patient's history has been reviewed, patient examined, no change in status, stable for surgery.  I have reviewed the patient's chart and labs.  Questions were answered to the patient's satisfaction.     Berwickoledo, [place] Estate[place]oro

## 2017-07-24 NOTE — Anesthesia Preprocedure Evaluation (Signed)
Anesthesia Evaluation  Patient identified by MRN, date of birth, ID band Patient awake    Reviewed: Allergy & Precautions, NPO status , Patient's Chart, lab work & pertinent test results  History of Anesthesia Complications Negative for: history of anesthetic complications  Airway Mallampati: III  TM Distance: >3 FB Neck ROM: Full    Dental no notable dental hx.    Pulmonary neg pulmonary ROS, neg sleep apnea, neg COPD,    breath sounds clear to auscultation- rhonchi (-) wheezing      Cardiovascular Exercise Tolerance: Good (-) hypertension(-) CAD and (-) Past MI  Rhythm:Regular Rate:Normal - Systolic murmurs and - Diastolic murmurs    Neuro/Psych  Headaches, PSYCHIATRIC DISORDERS Depression    GI/Hepatic negative GI ROS, Neg liver ROS,   Endo/Other  negative endocrine ROSneg diabetes  Renal/GU negative Renal ROS     Musculoskeletal negative musculoskeletal ROS (+)   Abdominal (+) + obese,   Peds  Hematology negative hematology ROS (+)   Anesthesia Other Findings Past Medical History: No date: BMI 50.0-59.9, adult (HCC) No date: Depression No date: Headache No date: IBS (irritable bowel syndrome)   Reproductive/Obstetrics                             Anesthesia Physical Anesthesia Plan  ASA: II  Anesthesia Plan: General   Post-op Pain Management:    Induction: Intravenous  PONV Risk Score and Plan: 2 and Propofol infusion  Airway Management Planned: Natural Airway  Additional Equipment:   Intra-op Plan:   Post-operative Plan:   Informed Consent: I have reviewed the patients History and Physical, chart, labs and discussed the procedure including the risks, benefits and alternatives for the proposed anesthesia with the patient or authorized representative who has indicated his/her understanding and acceptance.   Dental advisory given  Plan Discussed with: CRNA and  Anesthesiologist  Anesthesia Plan Comments:         Anesthesia Quick Evaluation

## 2017-07-24 NOTE — Transfer of Care (Signed)
Immediate Anesthesia Transfer of Care Note  Patient: Debra Mcdaniel  Procedure(s) Performed: COLONOSCOPY WITH PROPOFOL (N/A ) ESOPHAGOGASTRODUODENOSCOPY (EGD) WITH PROPOFOL (N/A )  Patient Location: Endoscopy Unit  Anesthesia Type:General  Level of Consciousness: awake  Airway & Oxygen Therapy: Patient Spontanous Breathing and Patient connected to nasal cannula oxygen  Post-op Assessment: Report given to RN and Post -op Vital signs reviewed and stable  Post vital signs: Reviewed  Last Vitals:  Vitals:   07/24/17 0851 07/24/17 0945  BP: (!) 133/100 109/71  Pulse: (!) 110 (!) 104  Resp: 20 18  Temp: 36.5 C (!) 36.4 C  SpO2: 99% 98%    Last Pain:  Vitals:   07/24/17 0851  TempSrc: Tympanic         Complications: No apparent anesthesia complications

## 2017-07-24 NOTE — Anesthesia Postprocedure Evaluation (Signed)
Anesthesia Post Note  Patient: Baron SaneLatreacia Parham  Procedure(s) Performed: COLONOSCOPY WITH PROPOFOL (N/A ) ESOPHAGOGASTRODUODENOSCOPY (EGD) WITH PROPOFOL (N/A )  Patient location during evaluation: Endoscopy Anesthesia Type: General Level of consciousness: awake and alert and oriented Pain management: pain level controlled Vital Signs Assessment: post-procedure vital signs reviewed and stable Respiratory status: spontaneous breathing, nonlabored ventilation and respiratory function stable Cardiovascular status: blood pressure returned to baseline and stable Postop Assessment: no signs of nausea or vomiting Anesthetic complications: no     Last Vitals:  Vitals:   07/24/17 0947 07/24/17 1007  BP: 109/71 135/87  Pulse:    Resp:    Temp: (!) 36.2 C   SpO2:      Last Pain:  Vitals:   07/24/17 1007  TempSrc:   PainSc: 0-No pain                 Candance Bohlman

## 2017-07-25 LAB — SURGICAL PATHOLOGY

## 2017-07-26 ENCOUNTER — Encounter: Payer: Self-pay | Admitting: Internal Medicine

## 2017-08-06 ENCOUNTER — Emergency Department
Admission: EM | Admit: 2017-08-06 | Discharge: 2017-08-06 | Disposition: A | Discharge status: Home / Self Care | Payer: Managed Care, Other (non HMO) | Attending: Emergency Medicine | Admitting: Emergency Medicine

## 2017-08-06 ENCOUNTER — Other Ambulatory Visit: Payer: Self-pay

## 2017-08-06 ENCOUNTER — Encounter: Payer: Self-pay | Admitting: Emergency Medicine

## 2017-08-06 DIAGNOSIS — B373 Candidiasis of vulva and vagina: Secondary | ICD-10-CM | POA: Insufficient documentation

## 2017-08-06 DIAGNOSIS — F329 Major depressive disorder, single episode, unspecified: Secondary | ICD-10-CM | POA: Diagnosis not present

## 2017-08-06 DIAGNOSIS — R102 Pelvic and perineal pain: Secondary | ICD-10-CM | POA: Diagnosis present

## 2017-08-06 DIAGNOSIS — B9689 Other specified bacterial agents as the cause of diseases classified elsewhere: Secondary | ICD-10-CM

## 2017-08-06 DIAGNOSIS — B3731 Acute candidiasis of vulva and vagina: Secondary | ICD-10-CM

## 2017-08-06 DIAGNOSIS — N760 Acute vaginitis: Secondary | ICD-10-CM | POA: Insufficient documentation

## 2017-08-06 DIAGNOSIS — N289 Disorder of kidney and ureter, unspecified: Secondary | ICD-10-CM | POA: Diagnosis not present

## 2017-08-06 LAB — URINALYSIS, COMPLETE (UACMP) WITH MICROSCOPIC
Bacteria, UA: NONE SEEN
Bilirubin Urine: NEGATIVE
GLUCOSE, UA: NEGATIVE mg/dL
Hgb urine dipstick: NEGATIVE
KETONES UR: NEGATIVE mg/dL
Leukocytes, UA: NEGATIVE
Nitrite: NEGATIVE
PH: 7 (ref 5.0–8.0)
Protein, ur: NEGATIVE mg/dL
SPECIFIC GRAVITY, URINE: 1.025 (ref 1.005–1.030)

## 2017-08-06 LAB — BASIC METABOLIC PANEL
Anion gap: 6 (ref 5–15)
BUN: 18 mg/dL (ref 6–20)
CALCIUM: 8.7 mg/dL — AB (ref 8.9–10.3)
CO2: 29 mmol/L (ref 22–32)
CREATININE: 1.16 mg/dL — AB (ref 0.44–1.00)
Chloride: 107 mmol/L (ref 101–111)
GFR calc Af Amer: >60 mL/min (ref >60)
GLUCOSE: 99 mg/dL (ref 65–99)
Potassium: 4 mmol/L (ref 3.5–5.1)
SODIUM: 142 mmol/L (ref 135–145)

## 2017-08-06 LAB — WET PREP, GENITAL
SPERM: NONE SEEN
Trich, Wet Prep: NONE SEEN

## 2017-08-06 LAB — CBC
HCT: 38.5 % (ref 35.0–47.0)
Hemoglobin: 12.6 g/dL (ref 12.0–16.0)
MCH: 30 pg (ref 26.0–34.0)
MCHC: 32.8 g/dL (ref 32.0–36.0)
MCV: 91.5 fL (ref 80.0–100.0)
PLATELETS: 416 10*3/uL (ref 150–440)
RBC: 4.21 MIL/uL (ref 3.80–5.20)
RDW: 14.1 % (ref 11.5–14.5)
WBC: 8.7 10*3/uL (ref 3.6–11.0)

## 2017-08-06 LAB — CHLAMYDIA/NGC RT PCR (ARMC ONLY)
Chlamydia Tr: NOT DETECTED
N GONORRHOEAE: NOT DETECTED

## 2017-08-06 LAB — POCT PREGNANCY, URINE: Preg Test, Ur: NEGATIVE

## 2017-08-06 MED ORDER — METRONIDAZOLE 500 MG PO TABS: 500.0000 mg | ORAL_TABLET | Freq: Two times a day (BID) | ORAL | 0 refills | Status: DC

## 2017-08-06 MED ORDER — CLOTRIMAZOLE 2 % VA CREA: 1.0000 | TOPICAL_CREAM | Freq: Every day | VAGINAL | 0 refills | Status: AC

## 2017-08-06 MED ORDER — TRAMADOL HCL 50 MG PO TABS
100.0000 mg | ORAL_TABLET | Freq: Once | ORAL | Status: AC
  Administered 2017-08-06: 100 mg via ORAL
  Filled 2017-08-06: qty 2

## 2017-08-06 NOTE — ED Triage Notes (Signed)
Pt to ED from home c/o vaginal pain and bilateral lower abd pain that started this morning, pain worse with movement and urination.  Denies n/v/d.

## 2017-08-06 NOTE — ED Provider Notes (Signed)
[facility] Emergency Department Provider Note  ____________________________________________  Time seen: Approximately 8:16 PM  I have reviewed the triage vital signs and the nursing notes.   HISTORY  Chief Complaint Vaginal Pain    HPI Debra Mcdaniel is a 27 y.o. female with morbid obesity, IBS, presenting with left low side pain, right pelvic pain, and vaginal pain.  The patient is presenting today because she was at work when she had some left side and low pelvic pain.  She then had some right sided pelvic pain with vaginal pain.  No change in vaginal discharge or vaginal bleeding.  Her pain was worse with urination "like I have been holding it all day."  She denies any nausea vomiting or diarrhea, fevers or chills.  She does not take anything for her symptoms.  Past Medical History:  Diagnosis Date  . BMI 50.0-59.9, adult (HCC)   . Depression   . Headache   . IBS (irritable bowel syndrome)     There are no active problems to display for this patient.   Past Surgical History:  Procedure Laterality Date  . CESAREAN SECTION    . COLONOSCOPY WITH PROPOFOL N/A 07/24/2017   Procedure: COLONOSCOPY WITH PROPOFOL;  Surgeon: Toledo, Boykin Nearing, MD;  Location: [facility] ENDOSCOPY;  Service: Gastroenterology;  Laterality: N/A;  . ESOPHAGOGASTRODUODENOSCOPY (EGD) WITH PROPOFOL N/A 07/24/2017   Procedure: ESOPHAGOGASTRODUODENOSCOPY (EGD) WITH PROPOFOL;  Surgeon: Toledo, Boykin Nearing, MD;  Location: [facility] ENDOSCOPY;  Service: Gastroenterology;  Laterality: N/A;  . MIrena   02/2013    Current Outpatient Rx  . Order #: [id number] Class: Print  . Order #: [id number] Class: Print  . Order #: [id number] Class: Print  . Order #: [id number] Class: Print  . Order #: [id number] Class: Historical Med  . Order #: [id number] Class: Historical Med  . Order #: [id number] Class: Print  . Order #: [id number] Class: Historical Med    Allergies Nickel  Family History  Problem Relation Age of  Onset  . Hypertension Mother   . Hypothyroidism Mother   . Hypertension Maternal Grandmother   . Hypertension Maternal Grandfather   . Hypertension Paternal Grandmother   . Hypertension Paternal Grandfather     Social History Social History   Tobacco Use  . Smoking status: Never Smoker  . Smokeless tobacco: Never Used  Substance Use Topics  . Alcohol use: Yes    Frequency: Never    Comment: occ  . Drug use: No    Review of Systems Constitutional: No fever/chills.  Syncope. Eyes: No visual changes. ENT: No sore throat. No congestion or rhinorrhea. Cardiovascular: Denies chest pain. Denies palpitations. Respiratory: Denies shortness of breath.  No cough. Gastrointestinal: Positive lower abdominal pain.  No nausea, no vomiting.  No diarrhea.  No constipation. Genitourinary:  No urinary frequency.  Positive pain with urination.  Positive vaginal pain.  Positive pelvic pain.  No change in vaginal discharge.  Does not use contraception. Musculoskeletal: Negative for back pain. Skin: Negative for rash. Neurological: Negative for headaches. No focal numbness, tingling or weakness.     ____________________________________________   PHYSICAL EXAM:  VITAL SIGNS: ED Triage Vitals  Enc Vitals Group     BP 08/06/17 1800 (!) 132/93     Pulse Rate 08/06/17 1800 100     Resp 08/06/17 1800 15     Temp 08/06/17 1800 98.1 F (36.7 C)     Temp Source 08/06/17 1800 Oral     SpO2 08/06/17 1800 100 %     Weight --  Height --      Head Circumference --      Peak Flow --      Pain Score 08/06/17 1816 6     Pain Loc --      Pain Edu? --      Excl. in GC? --     Constitutional: Alert and oriented. Well appearing and in no acute distress. Answers questions appropriately. Eyes: Conjunctivae are normal.  EOMI. No scleral icterus. Head: Atraumatic. Nose: No congestion/rhinnorhea. Mouth/Throat: Mucous membranes are moist.  Neck: No stridor.  Supple.  No JVD.  No  meningismus. Cardiovascular: Normal rate, regular rhythm. No murmurs, rubs or gallops.  Respiratory: Normal respiratory effort.  No accessory muscle use or retractions. Lungs CTAB.  No wheezes, rales or ronchi. Gastrointestinal: Morbidly obese.  Soft, and nondistended.  Minimal tenderness in the left upper quadrant.  No reproducible lower abdominal discomfort.  No guarding or rebound.  No peritoneal signs. Genitourinary: Normal-appearing external genitalia without lesions. Vaginal exam with thick green malodorous discharge, normal-appearing cervix, normal vaginal wall tissue. Bimanual exam is negative for CMT, positive for left adnexal tenderness to palpation, no palpable masses. Exam is limited to due morbid obesity.  Musculoskeletal: No LE edema. No ttp in the calves or palpable cords.  Negative Homan's sign. Neurologic:  A&Ox3.  Speech is clear.  Face and smile are symmetric.  EOMI.  Moves all extremities well. Skin:  Skin is warm, dry and intact. No rash noted. Psychiatric: Mood and affect are normal. Speech and behavior are normal.  Normal judgement  ____________________________________________   LABS (all labs ordered are listed, but only abnormal results are displayed)  Labs Reviewed  WET PREP, GENITAL - Abnormal; Notable for the following components:      Result Value   Yeast Wet Prep HPF POC PRESENT (*)    Clue Cells Wet Prep HPF POC PRESENT (*)    WBC, Wet Prep HPF POC FEW (*)    All other components within normal limits  BASIC METABOLIC PANEL - Abnormal; Notable for the following components:   Creatinine, Ser 1.16 (*)    Calcium 8.7 (*)    All other components within normal limits  URINALYSIS, COMPLETE (UACMP) WITH MICROSCOPIC - Abnormal; Notable for the following components:   Color, Urine YELLOW (*)    APPearance CLEAR (*)    Squamous Epithelial / LPF 0-5 (*)    All other components within normal limits  CHLAMYDIA/NGC RT PCR ([facility] ONLY)  CBC  POCT PREGNANCY,  URINE   ____________________________________________  EKG  Not indicated ____________________________________________  RADIOLOGY  No results found.  ____________________________________________   PROCEDURES  Procedure(s) performed: None  Procedures  Critical Care performed: No ____________________________________________   INITIAL IMPRESSION / ASSESSMENT AND PLAN / ED COURSE  Pertinent labs & imaging results that were available during my care of the patient were reviewed by me and considered in my medical decision making (see chart for details).  27 y.o. nonpregnant female presenting with vaginal pain, pain with urination, and left and right lower abdominal or pelvic pain.  Overall, the patient is afebrile and has a reassuring abdominal examination.  The only reproducible tenderness in the left upper quadrant which is not the pain she came for today.  We will do a pelvic examination.  Her urinalysis does not show any evidence of infection.  Her white blood cell count is normal, she is not anemic, and her BMP is reassuring as well, with the exception of a mildly elevated creatinine.  I have told her to drink plenty fluid and have this rechecked with her PMD..  I will treat the patient with Tylenol for her symptoms.  Plan reevaluation for final disposition.  ----------------------------------------- 9:51 PM on 08/06/2017 -----------------------------------------  The patient's wet prep is positive for yeast and bacterial vaginosis.  I will plan to discharge her home with a prescription for both. ____________________________________________  FINAL CLINICAL IMPRESSION(S) / ED DIAGNOSES  Final diagnoses:  Acute renal insufficiency  Vaginal yeast infection  Bacterial vaginosis         NEW MEDICATIONS STARTED DURING THIS VISIT:  New Prescriptions   CLOTRIMAZOLE (GYNE-LOTRIMIN 3) 2 % VAGINAL CREAM    Place 1 Applicatorful vaginally at bedtime for 3 days.    METRONIDAZOLE (FLAGYL) 500 MG TABLET    Take 1 tablet (500 mg total) by mouth 2 (two) times daily.      Rockne Menghini, MD 08/06/17 2209

## 2017-08-06 NOTE — Discharge Instructions (Addendum)
Please drink plenty of fluid and have your primary care physician recheck your kidney function, as it is slightly abnormal today.  Today you have a yeast infection and bacterial vaginosis, which is also an infection in your vagina.  Please take the clotrimazole applicators for your yeast infection, and the oral Flagyl for your bacterial vaginosis.  Return to the emergency department if you develop severe pain, lightheadedness or fainting, fever, inability to keep down fluids, or any other symptoms concerning to you.

## 2017-09-04 DIAGNOSIS — G43719 Chronic migraine without aura, intractable, without status migrainosus: Secondary | ICD-10-CM | POA: Insufficient documentation

## 2017-11-15 DIAGNOSIS — F411 Generalized anxiety disorder: Secondary | ICD-10-CM | POA: Insufficient documentation

## 2017-11-15 DIAGNOSIS — R51 Headache: Secondary | ICD-10-CM

## 2017-11-15 DIAGNOSIS — R519 Headache, unspecified: Secondary | ICD-10-CM | POA: Insufficient documentation

## 2018-01-08 ENCOUNTER — Encounter: Payer: Self-pay | Admitting: Obstetrics and Gynecology

## 2018-01-08 ENCOUNTER — Other Ambulatory Visit (HOSPITAL_COMMUNITY)
Admission: RE | Admit: 2018-01-08 | Discharge: 2018-01-08 | Disposition: A | Discharge status: Home / Self Care | Payer: Managed Care, Other (non HMO) | Source: Ambulatory Visit | Attending: Obstetrics and Gynecology | Admitting: Obstetrics and Gynecology

## 2018-01-08 ENCOUNTER — Ambulatory Visit (INDEPENDENT_AMBULATORY_CARE_PROVIDER_SITE_OTHER): Payer: Managed Care, Other (non HMO) | Admitting: Obstetrics and Gynecology

## 2018-01-08 VITALS — BP 112/80 | HR 81 | Ht 62.0 in | Wt 310.0 lb

## 2018-01-08 DIAGNOSIS — Z3202 Encounter for pregnancy test, result negative: Secondary | ICD-10-CM

## 2018-01-08 DIAGNOSIS — Z124 Encounter for screening for malignant neoplasm of cervix: Secondary | ICD-10-CM | POA: Diagnosis present

## 2018-01-08 DIAGNOSIS — Z01411 Encounter for gynecological examination (general) (routine) with abnormal findings: Secondary | ICD-10-CM

## 2018-01-08 DIAGNOSIS — Z3169 Encounter for other general counseling and advice on procreation: Secondary | ICD-10-CM | POA: Diagnosis not present

## 2018-01-08 DIAGNOSIS — N926 Irregular menstruation, unspecified: Secondary | ICD-10-CM

## 2018-01-08 DIAGNOSIS — Z01419 Encounter for gynecological examination (general) (routine) without abnormal findings: Secondary | ICD-10-CM

## 2018-01-08 LAB — POCT URINE PREGNANCY: PREG TEST UR: NEGATIVE

## 2018-01-08 NOTE — Progress Notes (Signed)
Chief Complaint  Patient presents with  . Gynecologic Exam    wants a pregnancy test done     HPI:      [name] is a 27 y.o. G1P1001 who LMP was Patient's last menstrual period was 11/18/2017 (exact date)., presents today for her annual examination.  Her menses are regular every 28-30 days, lasting 5-7 days.  Dysmenorrhea mild, occurring first 1-2 days of flow. She does not have intermenstrual bleeding. She had regular menses 7/19 but no menses since. Unusual for pt.  Sex activity: single partner, contraception - none. Conception ok. Not taking PNVs. Had pos urine ovulation kit in past. Is s/p normal pregnancy with C/S in past. Husband without children.  Last Pap: 10/04/16 Results were: no abnormalities Hx of STDs: none  There is no FH of breast cancer. There is no FH of ovarian cancer. The patient does do self-breast exams.  Tobacco use: The patient denies current or previous tobacco use. Alcohol use: social drinker Exercise: not active recently due to sprained ankle.  She does not get adequate calcium and Vitamin D in her diet.   Past Medical History:  Diagnosis Date  . BMI 50.0-59.9, adult ([facility])   . Depression   . Headache   . IBS (irritable bowel syndrome)     Past Surgical History:  Procedure Laterality Date  . CESAREAN SECTION    . COLONOSCOPY WITH PROPOFOL N/A 07/24/2017   Procedure: COLONOSCOPY WITH PROPOFOL;  Surgeon: Toledo, Benay Pike, MD;  Location: [facility] ENDOSCOPY;  Service: Gastroenterology;  Laterality: N/A;  . ESOPHAGOGASTRODUODENOSCOPY (EGD) WITH PROPOFOL N/A 07/24/2017   Procedure: ESOPHAGOGASTRODUODENOSCOPY (EGD) WITH PROPOFOL;  Surgeon: Toledo, Benay Pike, MD;  Location: [facility] ENDOSCOPY;  Service: Gastroenterology;  Laterality: N/A;  . MIrena   02/2013    Family History  Problem Relation Age of Onset  . Hypertension Mother   . Hypothyroidism Mother   . Hypertension Maternal Grandmother   . Hypertension Maternal Grandfather   . Hypertension  Paternal Grandmother   . Hypertension Paternal Grandfather   . Breast cancer Neg Hx   . Ovarian cancer Neg Hx     Social History   Socioeconomic History  . Marital status: Married    Spouse name: Not on file  . Number of children: Not on file  . Years of education: Not on file  . Highest education level: Not on file  Occupational History  . Not on file  Social Needs  . Financial resource strain: Not on file  . Food insecurity:    Worry: Not on file    Inability: Not on file  . Transportation needs:    Medical: Not on file    Non-medical: Not on file  Tobacco Use  . Smoking status: Never Smoker  . Smokeless tobacco: Never Used  Substance and Sexual Activity  . Alcohol use: Not Currently    Frequency: Never  . Drug use: No  . Sexual activity: Yes    Birth control/protection: None  Lifestyle  . Physical activity:    Days per week: Not on file    Minutes per session: Not on file  . Stress: Not on file  Relationships  . Social connections:    Talks on phone: Not on file    Gets together: Not on file    Attends religious service: Not on file    Active member of club or organization: Not on file    Attends meetings of clubs or organizations: Not on file  Relationship status: Not on file  . Intimate partner violence:    Fear of current or ex partner: Not on file    Emotionally abused: Not on file    Physically abused: Not on file    Forced sexual activity: Not on file  Other Topics Concern  . Not on file  Social History Narrative   ** Merged History Encounter **         Current Outpatient Medications:  Marland Kitchen  Vitamin D, Ergocalciferol, (DRISDOL) 50000 units CAPS capsule, Take 50,000 Units by mouth every 7 (seven) days., Disp: , Rfl:   ROS:  Review of Systems  Constitutional: Positive for fatigue. Negative for fever and unexpected weight change.  Respiratory: Negative for cough, shortness of breath and wheezing.   Cardiovascular: Negative for chest pain,  palpitations and leg swelling.  Gastrointestinal: Positive for diarrhea and nausea. Negative for blood in stool, constipation and vomiting.  Endocrine: Negative for cold intolerance, heat intolerance and polyuria.  Genitourinary: Positive for frequency. Negative for dyspareunia, dysuria, flank pain, genital sores, hematuria, menstrual problem, pelvic pain, urgency, vaginal bleeding, vaginal discharge and vaginal pain.  Musculoskeletal: Positive for arthralgias. Negative for back pain, joint swelling and myalgias.  Skin: Negative for rash.  Neurological: Positive for light-headedness and headaches. Negative for dizziness, syncope and numbness.  Hematological: Negative for adenopathy.  Psychiatric/Behavioral: Positive for agitation and dysphoric mood. Negative for confusion, sleep disturbance and suicidal ideas. The patient is not nervous/anxious.      Objective: BP 112/80   Pulse 81   Ht 5' 2"  (1.575 m)   Wt (!) 310 lb (140.6 kg)   LMP 11/18/2017 (Exact Date)   BMI 56.70 kg/m    Physical Exam  Constitutional: She is oriented to person, place, and time. She appears well-developed and well-nourished.  Genitourinary: Vagina normal and uterus normal. There is no rash or tenderness on the right labia. There is no rash or tenderness on the left labia. No erythema or tenderness in the vagina. No vaginal discharge found. Right adnexum does not display mass and does not display tenderness. Left adnexum does not display mass and does not display tenderness. Cervix does not exhibit motion tenderness or polyp. Uterus is not enlarged or tender.  Neck: Normal range of motion. No thyromegaly present.  Cardiovascular: Normal rate, regular rhythm and normal heart sounds.  No murmur heard. Pulmonary/Chest: Effort normal and breath sounds normal. Right breast exhibits no mass, no nipple discharge, no skin change and no tenderness. Left breast exhibits no mass, no nipple discharge, no skin change and no  tenderness.  Abdominal: Soft. There is no tenderness. There is no guarding.  Musculoskeletal: Normal range of motion.  Neurological: She is alert and oriented to person, place, and time. No cranial nerve deficit.  Psychiatric: She has a normal mood and affect. Her behavior is normal.  Vitals reviewed.  Results for orders placed or performed in visit on 01/08/18 (from the past 24 hour(s))  POCT urine pregnancy     Status: Normal   Collection Time: 01/08/18 10:20 AM  Result Value Ref Range   Preg Test, Ur Negative Negative    Assessment/Plan: Encounter for annual routine gynecological examination  Cervical cancer screening - Plan: Cytology - PAP  Late menses - Neg UPT. See if menses resumes this month. F/u by 10/19 if no menses for provera Rx.  - Plan: POCT urine pregnancy  Pre-conception counseling - Start PNVs/diet,exercise for wt loss. Urine ovulation pred kit. If pos, will do semen analysis.  GYN counsel family planning choices, adequate intake of calcium and vitamin D     F/U  Return in about 1 year (around 01/09/2019).  Jakaya Jacobowitz B. Kazue Cerro, PA-C 01/08/2018 10:41 AM     IUD REMOVAL PROCEDURE   History of Present Illness:  Debra Mcdaniel is a 27 y.o. that had a Mirena IUD placed approximately 4 years ago. Since that time, she has had irreg bleeding, anxiety/depression sx and wants it removed. She plans to use condoms only in the meantime.   BP 112/80   Pulse 81   Ht 5' 2"  (1.575 m)   Wt (!) 310 lb (140.6 kg)   LMP 11/18/2017 (Exact Date)   BMI 56.70 kg/m   Pelvic exam:  Two IUD strings present seen coming from the cervical os. EGBUS, vaginal vault and cervix: within normal limits  IUD Removal Strings of IUD identified and grasped.  IUD removed without problem with ring forceps.  Pt tolerated this well.  IUD noted to be intact.  Assessment:  IUD Removal   Plan: IUD removed and plan for contraception is condoms. She was amenable to this  plan.  Chisom Muntean B. Kailiana Granquist, PA-C 01/08/2018 10:41 AM

## 2018-01-08 NOTE — Patient Instructions (Signed)
I value your feedback and entrusting us with your care. If you get a [facility] patient survey, I would appreciate you taking the time to let us know about your experience today. Thank you! 

## 2018-01-09 LAB — CYTOLOGY - PAP: Diagnosis: NEGATIVE

## 2018-01-12 ENCOUNTER — Encounter: Payer: Self-pay | Admitting: Obstetrics and Gynecology

## 2018-08-25 ENCOUNTER — Ambulatory Visit (INDEPENDENT_AMBULATORY_CARE_PROVIDER_SITE_OTHER): Payer: Managed Care, Other (non HMO) | Admitting: Maternal Newborn

## 2018-08-25 ENCOUNTER — Encounter: Payer: Self-pay | Admitting: Maternal Newborn

## 2018-08-25 ENCOUNTER — Other Ambulatory Visit: Payer: Self-pay

## 2018-08-25 DIAGNOSIS — O34219 Maternal care for unspecified type scar from previous cesarean delivery: Secondary | ICD-10-CM

## 2018-08-25 DIAGNOSIS — Z369 Encounter for antenatal screening, unspecified: Secondary | ICD-10-CM

## 2018-08-25 DIAGNOSIS — O26891 Other specified pregnancy related conditions, first trimester: Secondary | ICD-10-CM

## 2018-08-25 DIAGNOSIS — O26899 Other specified pregnancy related conditions, unspecified trimester: Secondary | ICD-10-CM

## 2018-08-25 DIAGNOSIS — Z3A10 10 weeks gestation of pregnancy: Secondary | ICD-10-CM

## 2018-08-25 DIAGNOSIS — O0990 Supervision of high risk pregnancy, unspecified, unspecified trimester: Secondary | ICD-10-CM

## 2018-08-25 DIAGNOSIS — R110 Nausea: Secondary | ICD-10-CM

## 2018-08-25 DIAGNOSIS — Z6841 Body Mass Index (BMI) 40.0 and over, adult: Secondary | ICD-10-CM

## 2018-08-25 MED ORDER — DOXYLAMINE-PYRIDOXINE 10-10 MG PO TBEC: 2.0000 | DELAYED_RELEASE_TABLET | Freq: Every day | ORAL | 3 refills | Status: DC

## 2018-08-25 NOTE — Progress Notes (Signed)
Confirmed at [facility] 08/01/2018. No complaints. +breast tenderness,+ nausea all day daily, + vomiting 1-2 times weekly, denies vaginal bleeding spotting.

## 2018-08-25 NOTE — Progress Notes (Signed)
08/25/2018   Virtual Visit via Telephone Note  I connected with Debra Mcdaniel on 08/29/18 at  3:25 PM EDT by telephone and verified that I am speaking with the correct person using two identifiers. I was located at the office. The patient was located at her home.   I discussed the limitations, risks, security and privacy concerns of performing an evaluation and management service by telephone and the availability of in person appointments. The patient expressed understanding and agreed to proceed.  See NOB visit note below for documentation of this telephone visit.   I discussed the assessment and treatment plan with the patient. The patient was provided an opportunity to ask questions and all were answered. The patient agreed with the plan and demonstrated an understanding of the instructions.   The patient was advised to call back or seek an in-person evaluation if the symptoms worsen or if the condition fails to improve as anticipated.  I provided 35 minutes of non-face-to-face time during this encounter.   Debra Mcdaniel, CNM   NOB Telephone Visit  Chief Complaint: Desires prenatal care.  Transfer of Care Patient: no  History of Present Illness: Debra Mcdaniel is a 28 y.o. G2P1001 at [redacted]w[redacted]d based on Patient's last menstrual period on 06/14/2018 (exact date), with an Estimated Date of Delivery: 03/21/19, with the above CC.   Her periods were: regular periods every 28 days, lasting 7 days.  She has Positive signs or symptoms of nausea/vomiting of pregnancy. She has Negative signs or symptoms of miscarriage or preterm labor She identifies Negative Zika risk factors for her and her partner On any different medications around the time she conceived/early pregnancy: No  History of varicella: Yes    Review of Systems  Constitutional: Negative.   HENT: Negative.   Eyes: Negative.   Respiratory: Negative.   Cardiovascular: Negative.   Gastrointestinal: Positive for nausea and  vomiting. Negative for abdominal pain.       Irritable bowel syndrome  Genitourinary: Negative.   Musculoskeletal: Negative.   Skin: Negative.   Neurological: Positive for dizziness and headaches.       Migraines  Endo/Heme/Allergies: Negative.   Psychiatric/Behavioral: Negative.   Breasts: Positive for tenderness  Review of systems was otherwise negative, except as stated in the above HPI.  OBGYN History: As per HPI. OB History  Gravida Para Term Preterm AB Living  2 1 1  0 0 1  SAB TAB Ectopic Multiple Live Births  0 0 0   1    # Outcome Date GA Lbr Len/2nd Weight Sex Delivery Anes PTL Lv  2 Current           1 Term 12/22/12   8 lb 10 oz (3.912 kg) M CS-LTranv  N LIV    Any issues with any prior pregnancies: yes, Cesarean delivery with G1; failure to progress, CPD. Any prior children are healthy, doing well, without any problems or issues: yes History of pap smears: Yes. Last pap smear 01/08/2018. NILM History of STIs: No   Past Medical History: Past Medical History:  Diagnosis Date  . BMI 50.0-59.9, adult (HCC)   . Depression   . Headache   . IBS (irritable bowel syndrome)     Past Surgical History: Past Surgical History:  Procedure Laterality Date  . CESAREAN SECTION    . COLONOSCOPY WITH PROPOFOL N/A 07/24/2017   Procedure: COLONOSCOPY WITH PROPOFOL;  Surgeon: Toledo, Boykin Nearing, MD;  Location: [facility] ENDOSCOPY;  Service: Gastroenterology;  Laterality: N/A;  .  ESOPHAGOGASTRODUODENOSCOPY (EGD) WITH PROPOFOL N/A 07/24/2017   Procedure: ESOPHAGOGASTRODUODENOSCOPY (EGD) WITH PROPOFOL;  Surgeon: Toledo, Boykin Nearingeodoro K, MD;  Location: [facility] ENDOSCOPY;  Service: Gastroenterology;  Laterality: N/A;  . MIrena   02/2013    Family History:  Family History  Problem Relation Age of Onset  . Hypertension Mother   . Hypothyroidism Mother   . Hypertension Maternal Grandmother   . Hypertension Maternal Grandfather   . Hypertension Paternal Grandmother   . Hypertension Paternal  Grandfather   . Breast cancer Neg Hx   . Ovarian cancer Neg Hx    She denies any female cancers, bleeding or blood clotting disorders.  She reports that the father of the baby has a nephew with autism. She reports no other history of intellectual disability, birth defects or genetic disorders in her or the FOB's history  Social History:  Social History   Socioeconomic History  . Marital status: Married    Spouse name: Not on file  . Number of children: Not on file  . Years of education: Not on file  . Highest education level: Not on file  Occupational History  . Not on file  Social Needs  . Financial resource strain: Not on file  . Food insecurity:    Worry: Not on file    Inability: Not on file  . Transportation needs:    Medical: Not on file    Non-medical: Not on file  Tobacco Use  . Smoking status: Never Smoker  . Smokeless tobacco: Never Used  Substance and Sexual Activity  . Alcohol use: Not Currently    Frequency: Never  . Drug use: No  . Sexual activity: Yes    Birth control/protection: None  Lifestyle  . Physical activity:    Days per week: Not on file    Minutes per session: Not on file  . Stress: Not on file  Relationships  . Social connections:    Talks on phone: Not on file    Gets together: Not on file    Attends religious service: Not on file    Active member of club or organization: Not on file    Attends meetings of clubs or organizations: Not on file    Relationship status: Not on file  . Intimate partner violence:    Fear of current or ex partner: Not on file    Emotionally abused: Not on file    Physically abused: Not on file    Forced sexual activity: Not on file  Other Topics Concern  . Not on file  Social History Narrative   ** Merged History Encounter **       Any cats in the household: no Domestic violence screening is negative.  Allergy: Allergies  Allergen Reactions  . Nickel Rash    Current Outpatient Medications:   Current Outpatient Medications:  .  Prenatal Vit-Fe Fumarate-FA (MULTIVITAMIN-PRENATAL) 27-0.8 MG TABS tablet, Take 1 tablet by mouth daily at 12 noon., Disp: , Rfl:  .  Vitamin D, Ergocalciferol, (DRISDOL) 50000 units CAPS capsule, Take 50,000 Units by mouth every 7 (seven) days., Disp: , Rfl:    Physical Exam:   LMP 06/14/2018 (Exact Date)  There is no height or weight on file to calculate BMI. Physical exam was not done because this was a telephone visit  Assessment: Debra Mcdaniel is a 28 y.o. G2P1001 at 6983w2d based on Patient's last menstrual period on 06/14/2018 (exact date), with an Estimated Date of Delivery: 03/21/2019, presenting for prenatal care.  Plan:  1) Avoid alcoholic beverages. 2) Patient encouraged not to smoke.  3) Discontinue the use of all non-medicinal drugs and chemicals.  4) Take prenatal vitamins daily.  5) Seatbelt use advised 6) Nutrition, food safety (fish, cheese advisories, and high nitrite foods) and exercise discussed. 7) Hospital and practice style delivering at [facility] discussed  8) Patient is asked about travel to areas at risk for the Zika virus, and counseled to avoid travel and exposure to mosquitoes or partners who may have themselves been exposed to the virus. 9) Childbirth classes at [facility] advised 10) Genetic Screening, such as with 1st Trimester Screening, cell free fetal DNA, AFP testing, and Ultrasound, as well as with amniocentesis and CVS as appropriate, is discussed with patient. She plans to have genetic testing this pregnancy. 11) Dating scan ordered. 12) NOB labs ordered with GTT. 13) Diclegis Rx sent for nausea/vomiting of pregnancy.  Problem list reviewed and updated.  Marcelyn Bruins, CNM

## 2018-08-29 DIAGNOSIS — Z6841 Body Mass Index (BMI) 40.0 and over, adult: Secondary | ICD-10-CM | POA: Insufficient documentation

## 2018-08-29 DIAGNOSIS — O34219 Maternal care for unspecified type scar from previous cesarean delivery: Secondary | ICD-10-CM | POA: Insufficient documentation

## 2018-08-29 DIAGNOSIS — O0990 Supervision of high risk pregnancy, unspecified, unspecified trimester: Secondary | ICD-10-CM | POA: Insufficient documentation

## 2018-08-29 DIAGNOSIS — Z3480 Encounter for supervision of other normal pregnancy, unspecified trimester: Secondary | ICD-10-CM

## 2018-08-29 NOTE — Patient Instructions (Signed)
Hello,  Given the current COVID-19 pandemic, our practice is making changes in how we are providing care to our patients. We are limiting in-person visits for the safety of all of our patients.   As a practice, we have met to discuss the best way to minimize visits, but still provide excellent care to our expecting mothers.  We have decided on the following visit structure for low-risk pregnancies.  Initial Pregnancy visit will be conducted as a telephone or web visit.  Between 10-14 weeks  there will be one in-person visit for an ultrasound, lab work, and genetic screening. 20 weeks in-person visit with an anatomy ultrasound  28 weeks in-person office visit for a 1-hour glucose test and a TDAP vaccination 32 weeks in-person office visit 34 weeks telephone visit 36 weeks in-person office visit for GBS, chlamydia, and gonorrhea testing 38 weeks in-person office visit 40 weeks in-person office visit  Understandably, some patients will require more visits than what is outlined above. Additional visits will be determined on a case-by-case basis.   We will, as always, be available for emergencies or to address concerns that might arise between in-person visits. We ask that you allow [place] the opportunity to address any concerns over the phone or through a virtual visit first. We will be available to return your phone calls throughout the day.   If you are able to purchase a scale, a blood pressure machine, and a home fetal doppler visits could be limited further. This will help decrease your exposure risks, but these purchases are not a necessity.   Things seem to change daily and there is the possibility that this structure could change, please be patient as we adapt to a new way of caring for patients.   Thank you for trusting [place] with your prenatal care. Our practice values you and looks forward to providing you with excellent care.   Sincerely,   [facility], [place]     COVID-19 and Your Pregnancy FAQ  How can I prevent infection with COVID-19 during my pregnancy? Social distancing is key. Please limit any interactions in public. Try and work from home if possible. Frequently wash your hands after touching possibly contaminated surfaces. Avoid touching your face.  Minimize trips to the store. Consider online ordering when possible.   Should I wear a mask? YES. It is recommended by the CDC that all people wear a cloth mask or facial covering in public. This will help reduce transmission as well as your risk or acquiring COVID-19. New studies are showing that even asymptomatic individuals can spread the virus from talking.   What are the symptoms of COVID-19? Fever (greater than 100.4 F), dry cough, shortness of breath.  Am I more at risk for COVID-19 since I am pregnant? There is not currently data showing that pregnant women are more adversely impacted by COVID-19 than the general population. However, we know that pregnant women tend to have worse respiratory complications from similar diseases such as the flu and SARS and for this reason should be considered an at-risk population.  What do I do if I am experiencing the symptoms of COVID-19? Testing is being limited because of test availability. If you are experiencing symptoms you should quarantine yourself, and the members of your family, for at least 2 weeks at home.   Please visit this website for more information: [email or web address]  When should I go to the Emergency Room? Please go to the emergency room if you  are experiencing ANY of these symptoms*:  1.    Difficulty breathing or shortness of breath 2.    Persistent pain or pressure in the chest 3.    Confusion or difficulty being aroused (or awakened) 4.    Bluish lips or face  *This list is not all inclusive. Please consult our office for any other symptoms that are severe or  concerning.  What do I do if I am having difficulty breathing? You should go to the Emergency Room for evaluation. At this time they have a tent set up for evaluating patients with COVID-19 symptoms.   How will my prenatal care be different because of the COVID-19 pandemic? It has been recommended to reduce the frequency of face-to-face visits and use resources such as telephone and virtual visits when possible. Using a scale, blood pressure machine and fetal doppler at home can further help reduce face-to-face visits. You will be provided with additional information on this topic.  We ask that you come to your visits alone to minimize potential exposures to  COVID-19.  How can I receive childbirth education? At this time in-person classes have been cancelled. You can register for online childbirth education, breastfeeding, and newborn care classes.  Please visit:  [email or web address] for more information  How will my hospital birth experience be different? The hospital is currently limiting visitors. This means that while you are in labor you can only have one person at the hospital with you. Additional family members will not be allowed to wait in the building or outside your room. Your one support person can be the father of the baby, a relative, a doula, or a friend. Once one support person is designated that person will wear a band. This band cannot be shared with multiple people.  Nitrous Gas is not being offered for pain relief since the tubing and filter for the machine can not be sanitized in a way to guarantee prevention of transmission of COVID-19.  Nasal cannula use of oxygen for fetal indications has also been discontinued.  Currently a clear plastic sheet is being hung between mom and the delivering provider during pushing and delivery to help prevent transmission of COVID-19.      How long will I stay in the hospital for after giving birth? It is also recommended that  discharge home be expedited during the COVID-19 outbreak. This means staying for 1 day after a vaginal delivery and 2 days after a cesarean section. Patients who need to stay longer for medical reasons are allowed to do so, but the goal will be for expedited discharge home.   What if I have COVID-19 and I am in labor? We ask that you wear a mask while on labor and delivery. We will try and accommodate you being placed in a room that is capable of filtering the air. Please call ahead if you are in labor and on your way to the hospital. The phone number for labor and delivery at [facility] is [phone].  If I have COVID-19 when my baby is born how can I prevent my baby from contracting COVID-19? This is an issue that will have to be discussed on a case-by-case basis. Current recommendations suggest providing separate isolation rooms for both the mother and new infant as well as limiting visitors. However, there are practical challenges to this recommendation. The situation will assuredly change and decisions will be influenced by the desires of the mother and availability of space.    Some suggestions are the use of a curtain or physical barrier between mom and infant, hand hygiene, mom wearing a mask, or 6 feet of spacing between a mom and infant.   Can I breastfeed during the COVID-19 pandemic?   Yes, breastfeeding is encouraged.  Can I breastfeed if I have COVID-19? Yes. Covid-19 has not been found in breast milk. This means you cannot give COVID-19 to your child through breast milk. Breast feeding will also help pass antibodies to fight infection to your baby.   What precautions should I take when breastfeeding if I have COVID-19? If a mother and newborn do room-in and the mother wishes to feed at the breast, she should put on a facemask and practice hand hygiene before each feeding.  What precautions should I take when pumping if I have COVID-19? Prior to expressing  breast milk, mothers should practice hand hygiene. After each pumping session, all parts that come into contact with breast milk should be thoroughly washed and the entire pump should be appropriately disinfected per the manufacturer's instructions. This expressed breast milk should be fed to the newborn by a healthy caregiver.  What if I am pregnant and work in healthcare? Based on limited data regarding COVID-19 and pregnancy, ACOG currently does not propose creating additional restrictions on pregnant health care personnel because of COVID-19 alone. Pregnant women do not appear to be at higher risk of severe disease related to COVID-19. Pregnant health care personnel should follow CDC risk assessment and infection control guidelines for health care personnel exposed to patients with suspected or confirmed COVID-19. Adherence to recommended infection prevention and control practices is an important part of protecting all health care personnel in health care settings.    Information on COVID-19 in pregnancy is very limited; however, facilities may want to consider limiting exposure of pregnant health care personnel to patients with confirmed or suspected COVID-19 infection, especially during higher-risk procedures (eg, aerosol-generating procedures), if feasible, based on staffing availability.

## 2018-09-03 ENCOUNTER — Other Ambulatory Visit: Payer: Self-pay

## 2018-09-03 ENCOUNTER — Ambulatory Visit (INDEPENDENT_AMBULATORY_CARE_PROVIDER_SITE_OTHER): Payer: Managed Care, Other (non HMO)

## 2018-09-03 ENCOUNTER — Ambulatory Visit (INDEPENDENT_AMBULATORY_CARE_PROVIDER_SITE_OTHER): Payer: Managed Care, Other (non HMO) | Admitting: Obstetrics and Gynecology

## 2018-09-03 ENCOUNTER — Other Ambulatory Visit: Payer: Managed Care, Other (non HMO)

## 2018-09-03 VITALS — BP 110/66 | Wt 311.0 lb

## 2018-09-03 DIAGNOSIS — Z3687 Encounter for antenatal screening for uncertain dates: Secondary | ICD-10-CM | POA: Diagnosis not present

## 2018-09-03 DIAGNOSIS — O99210 Obesity complicating pregnancy, unspecified trimester: Secondary | ICD-10-CM | POA: Insufficient documentation

## 2018-09-03 DIAGNOSIS — O0990 Supervision of high risk pregnancy, unspecified, unspecified trimester: Secondary | ICD-10-CM

## 2018-09-03 DIAGNOSIS — Z369 Encounter for antenatal screening, unspecified: Secondary | ICD-10-CM

## 2018-09-03 DIAGNOSIS — Z3A11 11 weeks gestation of pregnancy: Secondary | ICD-10-CM

## 2018-09-03 DIAGNOSIS — O99211 Obesity complicating pregnancy, first trimester: Secondary | ICD-10-CM

## 2018-09-03 DIAGNOSIS — Z31438 Encounter for other genetic testing of female for procreative management: Secondary | ICD-10-CM

## 2018-09-03 DIAGNOSIS — Z3480 Encounter for supervision of other normal pregnancy, unspecified trimester: Secondary | ICD-10-CM

## 2018-09-03 DIAGNOSIS — Z1379 Encounter for other screening for genetic and chromosomal anomalies: Secondary | ICD-10-CM

## 2018-09-03 MED ORDER — ONDANSETRON 4 MG PO TBDP: 4.0000 mg | ORAL_TABLET | Freq: Four times a day (QID) | ORAL | 2 refills | Status: DC | PRN

## 2018-09-03 NOTE — Progress Notes (Signed)
Routine Prenatal Care Visit  Subjective  Debra Mcdaniel is a 28 y.o. G2P1001 at [redacted]w[redacted]d being seen today for ongoing prenatal care.  She is currently monitored for the following issues for this high-risk pregnancy and has GAD (generalized anxiety disorder); Headache disorder; Rh negative status during pregnancy; Supervision of other normal pregnancy, antepartum; BMI 50.0-59.9, adult (HCC); History of cesarean delivery, currently pregnant; Intractable chronic migraine without aura and without status migrainosus; Irritable bowel syndrome with diarrhea; and Obesity affecting pregnancy, antepartum on their problem list.  ----------------------------------------------------------------------------------- Patient reports no complaints.   Contractions: Not present. Vag. Bleeding: None.  Movement: Absent. Denies leaking of fluid.  ----------------------------------------------------------------------------------- The following portions of the patient's history were reviewed and updated as appropriate: allergies, current medications, past family history, past medical history, past social history, past surgical history and problem list. Problem list updated.   Objective  Blood pressure 110/66, weight (!) 311 lb (141.1 kg), last menstrual period 06/14/2018. Pregravid weight 305 lb (138.3 kg) Total Weight Gain 6 lb (2.722 kg) Urinalysis:      Fetal Status: Fetal Heart Rate (bpm): 140   Movement: Absent     General:  Alert, oriented and cooperative. Patient is in no acute distress.  Skin: Skin is warm and dry. No rash noted.   Cardiovascular: Normal heart rate noted  Respiratory: Normal respiratory effort, no problems with respiration noted  Abdomen: Soft, gravid, appropriate for gestational age. Pain/Pressure: Absent     Pelvic:  Cervical exam deferred        Extremities: Normal range of motion.     ental Status: Normal mood and affect. Normal behavior. Normal judgment and thought content.   US  Ob Comp Less 14 Wks  Result Date: 09/03/2018 Patient Name: Debra Mcdaniel DOB: 1990-06-29 MRN: [id number] ULTRASOUND REPORT Location: [facility] Date of Service: 09/03/2018 Indications:dating Findings: [name] Jim intrauterine pregnancy is visualized with a CRL consistent with [redacted]w[redacted]d gestation, giving an (U/S) EDD of 03/18/2019. The (U/S) EDD is consistent with the clinically established EDD of 03/21/2019. FHR: 148 BPM CRL measurement: 73.2 mm Yolk sac is not visualized and early anatomy is normal. Amnion: not visualized Right Ovary is normal in appearance. Left Ovary is normal appearance. Corpus luteal cyst:  is not visualized Survey of the adnexa demonstrates no adnexal masses. There is no free peritoneal fluid in the cul de sac. Impression: 1. [redacted]w[redacted]d Viable Singleton Intrauterine pregnancy by U/S. 2. (U/S) EDD is consistent with Clinically established EDD of 03/21/2019. 3. Limited exam do to body habitus. Recommendations: 1.Clinical correlation with the patient's History and Physical Exam. Clydene Laming, RDMS There is a viable singleton gestation.  The fetal biometry correlates with established dating. Detailed evaluation of the fetal anatomy is precluded by early gestational age.  It must be noted that a normal ultrasound particular at this early gestational age is unable to rule out fetal aneuploidy, risk of first trimester miscarriage, or anatomic birth defects. [name], MD, Evern Core [facility], [facility] 09/03/2018, 2:02 PM     Assessment   28 y.o. G2P1001 at [redacted]w[redacted]d by  03/21/2019, by Last Menstrual Period presenting for routine prenatal visit  Plan   Pregnancy #2 Problems (from 08/25/18 to present)    Problem Noted Resolved   Supervision of other normal pregnancy, antepartum 08/29/2018 by Oswaldo Conroy, CNM No   Overview Addendum 09/03/2018  1:58 PM by [name], MD    Clinic [facility] Prenatal Labs  Dating LMP = 12 week [place] Blood type:  Genetic Screen  1 Screen:    AFP:     Quad:     NIPS: Antibody:   Anatomic US  Rubella:   Varicella:    GTT Early:               Third trimester:  RPR:     Rhogam  HBsAg:     TDaP vaccine                       Flu Shot: HIV:     Baby Food                                GBS:   Contraception  Pap:  CBB     CS/VBAC    Support Person              BMI 50.0-59.9, adult (HCC) 08/29/2018 by Oswaldo ConroySchmid, Jacelyn Y, CNM No   Overview Signed 08/29/2018  1:28 PM by Oswaldo ConroySchmid, Jacelyn Y, CNM    BMI >=40 [ ]  early 1h gtt -  [ ]  u/s for dating [ ]   [ ]  nutritional goals [ ]  folic acid 1mg  [ ]  bASA (>12 weeks) [ ]  consider nutrition consult [ ]  consider maternal EKG 1st trimester [ ]  Growth u/s 28 [ ] , 32 [ ] , 36 weeks [ ]  [ ]  NST/AFI weekly 36+ weeks (36[] , 37[] , 38[] , 39[] , 40[] ) [ ]  IOL by 41 weeks (scheduled, prn [] )          Gestational age appropriate obstetric precautions including but not limited to vaginal bleeding, contractions, leaking of fluid and fetal movement were reviewed in detail with the patient.    - continued nausea diclegis not covered.  Rx Zofran.  Discussed single study showing increased risk of cleft lip cleft palate and heart defects, subsequent studies and larger studies out of [place] and [place] have failed to show any consistent association between Zofran exposure and these birth defects.  It is also important to note that Zofran exposed pregnancies and non-exposed pregnancies likely differ in the degree of nausea these two groups experience.  Folate acid metabolism which is linked to clefting and heart defects is a more likely pathway to causing these.  It stand to reason that patients with worse nausea are less consistent with taking PNV and therefore have lower folic acid levels.    - PNL, early 1-hr, and gentic testing today  - Start low dose ASA at >[redacted] weeks gestation as per USPTF recommendation "Low-Dose Aspirin Use for the Prevention of Morbidity and Mortality From Preeclampsia:  Preventive Medicine"  furthermore endorsed by ACOG, WHO, and NIH based on evidence level B for the prevention of preeclampsia  In women deemed high risk  (diabetes, renal disease, chronic hypertension, history of preeclampsia in prior gestation, autoimmune diseases, or multifetal gestations)   Return in about 4 weeks (around 10/01/2018) for ROB phone 4 weeks.  Vena AustriaAndreas Lizmarie Witters, MD, Evern CoreFACOG [facility], [facility] 09/03/2018, 2:28 PM

## 2018-09-03 NOTE — Progress Notes (Signed)
ROB Ultrasound and GTT today

## 2018-09-03 NOTE — Patient Instructions (Signed)
Start low dose ASA at >[redacted] weeks gestation as per USPTF recommendation "Low-Dose Aspirin Use for the Prevention of Morbidity and Mortality From Preeclampsia: Preventive Medicine"  furthermore endorsed by ACOG, WHO, and NIH based on evidence level B for the prevention of preeclampsia  In women deemed high risk  (diabetes, renal disease, chronic hypertension, history of preeclampsia in prior gestation, autoimmune diseases, or multifetal gestations).  ACOG Committee Opinion 743 "Low-Dose Asprin Use During Pregnancy" June 25th 2018  High Risk (Start if 1 or more present) History of preeclampsia Multifetal Gestation Chronic HTN Type I or II DM Renal Disease Autoimmune Disease (SLE, Antiphospholipid antibody syndrome)  Moderate Risk (consider starting if more than one present) Nulliparity Obesity (BMI >30) Family history of Preeclampsia (Mother or sister) Socioeconomic characteristics (African American, low socieeconomic status) Age 35 years or older Personal history factors (low birthweight of SGA, previous adverse pregnancy outcome, more than 10 year pregnancy interval)  

## 2018-09-05 LAB — RPR+RH+ABO+RUB AB+AB SCR+CB...
Antibody Screen: NEGATIVE
HIV Screen 4th Generation wRfx: NONREACTIVE
Hematocrit: 34.3 % (ref 34.0–46.6)
Hemoglobin: 11.9 g/dL (ref 11.1–15.9)
Hepatitis B Surface Ag: NEGATIVE
MCH: 30.6 pg (ref 26.6–33.0)
MCHC: 34.7 g/dL (ref 31.5–35.7)
MCV: 88 fL (ref 79–97)
Platelets: 391 10*3/uL (ref 150–450)
RBC: 3.89 x10E6/uL (ref 3.77–5.28)
RDW: 13.1 % (ref 11.7–15.4)
RPR Ser Ql: NONREACTIVE
Rh Factor: NEGATIVE
Rubella Antibodies, IGG: 2.36 index (ref >0.99)
Varicella zoster IgG: 274 index (ref >165)
WBC: 8 10*3/uL (ref 3.4–10.8)

## 2018-09-05 LAB — HEMOGLOBINOPATHY EVALUATION
HGB C: 0 %
HGB S: 0 %
HGB VARIANT: 0 %
Hemoglobin A2 Quantitation: 2 % (ref 1.8–3.2)
Hemoglobin F Quantitation: 0.5 % (ref 0.0–2.0)
Hgb A: 97.5 % (ref 96.4–98.8)

## 2018-09-05 LAB — GLUCOSE, 1 HOUR GESTATIONAL: Gestational Diabetes Screen: 89 mg/dL (ref 65–139)

## 2018-09-08 LAB — MATERNIT 21 PLUS CORE, BLOOD
Fetal Fraction: 6
Result (T21): NEGATIVE
Trisomy 13 (Patau syndrome): NEGATIVE
Trisomy 18 (Edwards syndrome): NEGATIVE
Trisomy 21 (Down syndrome): NEGATIVE

## 2018-09-17 LAB — INHERITEST CORE(CF97,SMA,FRAX)

## 2018-09-30 ENCOUNTER — Other Ambulatory Visit: Payer: Self-pay

## 2018-09-30 ENCOUNTER — Ambulatory Visit (INDEPENDENT_AMBULATORY_CARE_PROVIDER_SITE_OTHER): Payer: Managed Care, Other (non HMO) | Admitting: Obstetrics and Gynecology

## 2018-09-30 DIAGNOSIS — Z3480 Encounter for supervision of other normal pregnancy, unspecified trimester: Secondary | ICD-10-CM

## 2018-09-30 DIAGNOSIS — O99210 Obesity complicating pregnancy, unspecified trimester: Secondary | ICD-10-CM

## 2018-09-30 DIAGNOSIS — G43719 Chronic migraine without aura, intractable, without status migrainosus: Secondary | ICD-10-CM

## 2018-09-30 DIAGNOSIS — Z363 Encounter for antenatal screening for malformations: Secondary | ICD-10-CM

## 2018-09-30 DIAGNOSIS — Z3A15 15 weeks gestation of pregnancy: Secondary | ICD-10-CM

## 2018-09-30 MED ORDER — CONCEPT DHA 53.5-38-1 MG PO CAPS: 1.0000 | ORAL_CAPSULE | Freq: Every day | ORAL | 11 refills | Status: DC

## 2018-09-30 NOTE — Progress Notes (Signed)
I connected with Debra Mcdaniel  on 09/30/18 at  4:10 PM EDT by telephone and verified that I am speaking with the correct person using two identifiers.   I discussed the limitations, risks, security and privacy concerns of performing an evaluation and management service by telephone and the availability of in person appointments. I also discussed with the patient that there may be a patient responsible charge related to this service. The patient expressed understanding and agreed to proceed.  The patient was at home I spoke with the patient from my workstation phone The names of people involved in this encounter were: Debra Mcdaniel , and Vena AustriaAndreas Marieli Rudy   Routine Prenatal Care Visit  Subjective  Debra Mcdaniel is a 28 y.o. G2P1001 at 3051w3d being seen today for ongoing prenatal care.  She is currently monitored for the following issues for this high-risk pregnancy and has GAD (generalized anxiety disorder); Headache disorder; Rh negative status during pregnancy; Supervision of other normal pregnancy, antepartum; BMI 50.0-59.9, adult (HCC); History of cesarean delivery, currently pregnant; Intractable chronic migraine without aura and without status migrainosus; Irritable bowel syndrome with diarrhea; and Obesity affecting pregnancy, antepartum on their problem list.  ----------------------------------------------------------------------------------- Patient reports no complaints.   Contractions: Not present. Vag. Bleeding: None.  Movement: Absent. Denies leaking of fluid.  ----------------------------------------------------------------------------------- The following portions of the patient's history were reviewed and updated as appropriate: allergies, current medications, past family history, past medical history, past social history, past surgical history and problem list. Problem list updated.   Objective  Last menstrual period 06/14/2018. Pregravid weight 305 lb (138.3 kg) Total  Weight Gain 6 lb (2.722 kg) Urinalysis:      Fetal Status:     Movement: Absent     No physical exam as this was a remote telephone visit to promote social distancing during the current COVID-19 Pandemic  Assessment   28 y.o. G2P1001 at 8851w3d by  03/21/2019, by Last Menstrual Period presenting for routine prenatal visit  Plan   Pregnancy #2 Problems (from 08/25/18 to present)    Problem Noted Resolved   Supervision of other normal pregnancy, antepartum 08/29/2018 by Oswaldo ConroySchmid, Jacelyn Y, CNM No   Overview Addendum 09/17/2018  7:47 AM by Vena AustriaStaebler, Huey Scalia, MD    Clinic [facility] Prenatal Labs  Dating LMP = 12 week US Blood type:     Genetic Screen NIPS: Normal XY Inheritest: negative Antibody:   Anatomic US  Rubella:   Varicella:    GTT Early:               Third trimester:  RPR:     Rhogam  HBsAg:     TDaP vaccine                       Flu Shot: HIV:     Baby Food                                GBS:   Contraception  Pap:  CBB     CS/VBAC    Support Person              BMI 50.0-59.9, adult (HCC) 08/29/2018 by Oswaldo ConroySchmid, Jacelyn Y, CNM No   Overview Signed 08/29/2018  1:28 PM by Oswaldo ConroySchmid, Jacelyn Y, CNM    BMI >=40 [ ]  early 1h gtt -  [ ]  u/s for dating [ ]   [ ]  nutritional goals [ ]  folic  acid 1mg  [ ]  bASA (>12 weeks) [ ]  consider nutrition consult [ ]  consider maternal EKG 1st trimester [ ]  Growth u/s 28 [ ] , 32 [ ] , 36 weeks [ ]  [ ]  NST/AFI weekly 36+ weeks (36[] , 37[] , 38[] , 39[] , 40[] ) [ ]  IOL by 41 weeks (scheduled, prn [] )          Gestational age appropriate obstetric precautions including but not limited to vaginal bleeding, contractions, leaking of fluid and fetal movement were reviewed in detail with the patient.    Start low dose ASA at >[redacted] weeks gestation as per USPTF recommendation "Low-Dose Aspirin Use for the Prevention of Morbidity and Mortality From Preeclampsia: Preventive Medicine"  furthermore endorsed by ACOG, WHO, and NIH based on evidence level B for the  prevention of preeclampsia  In women deemed high risk  (diabetes, renal disease, chronic hypertension, history of preeclampsia in prior gestation, autoimmune diseases, or multifetal gestations).  ACOG Committee Opinion 743 "Low-Dose Asprin Use During Pregnancy" June 25th 2018  High Risk (Start if 1 or more present) History of preeclampsia Multifetal Gestation Chronic HTN Type I or II DM Renal Disease Autoimmune Disease (SLE, Antiphospholipid antibody syndrome)  Moderate Risk (consider starting if more than one present) Nulliparity Obesity (BMI >30) Family history of Preeclampsia (Mother or sister) Socioeconomic characteristics (African American, low socieeconomic status) Age 30 years or older Personal history factors (low birthweight of SGA, previous adverse pregnancy outcome, more than 10 year pregnancy interval)  Telephone 23:52 minutes  Anatomy scan  Return in about 4 weeks (around 10/28/2018) for ROB and anatomy scan.  [name], MD, Evern Core [facility], [facility] 09/30/2018, 4:14 PM

## 2018-09-30 NOTE — Progress Notes (Signed)
ROB televisit No concerns  

## 2018-10-30 ENCOUNTER — Ambulatory Visit (INDEPENDENT_AMBULATORY_CARE_PROVIDER_SITE_OTHER): Payer: Managed Care, Other (non HMO) | Admitting: Advanced Practice Midwife

## 2018-10-30 ENCOUNTER — Encounter: Payer: Self-pay | Admitting: Advanced Practice Midwife

## 2018-10-30 ENCOUNTER — Ambulatory Visit (INDEPENDENT_AMBULATORY_CARE_PROVIDER_SITE_OTHER): Payer: Managed Care, Other (non HMO)

## 2018-10-30 ENCOUNTER — Encounter: Payer: Managed Care, Other (non HMO) | Admitting: Maternal Newborn

## 2018-10-30 ENCOUNTER — Other Ambulatory Visit: Payer: Self-pay

## 2018-10-30 VITALS — BP 128/80 | Wt 320.0 lb

## 2018-10-30 DIAGNOSIS — Z3A19 19 weeks gestation of pregnancy: Secondary | ICD-10-CM

## 2018-10-30 DIAGNOSIS — O0990 Supervision of high risk pregnancy, unspecified, unspecified trimester: Secondary | ICD-10-CM

## 2018-10-30 DIAGNOSIS — Z363 Encounter for antenatal screening for malformations: Secondary | ICD-10-CM

## 2018-10-30 DIAGNOSIS — O0992 Supervision of high risk pregnancy, unspecified, second trimester: Secondary | ICD-10-CM

## 2018-10-30 DIAGNOSIS — G43719 Chronic migraine without aura, intractable, without status migrainosus: Secondary | ICD-10-CM

## 2018-10-30 DIAGNOSIS — O99210 Obesity complicating pregnancy, unspecified trimester: Secondary | ICD-10-CM

## 2018-10-30 DIAGNOSIS — Z3480 Encounter for supervision of other normal pregnancy, unspecified trimester: Secondary | ICD-10-CM

## 2018-10-30 NOTE — Patient Instructions (Signed)
Perinatal Anxiety °When a woman feels excessive tension or worry (anxiety) during pregnancy or during the first 12 months after she gives birth, she has a condition called perinatal anxiety. Anxiety can interfere with work, school, relationships, and other everyday activities. If it is not managed properly, it can also cause problems in the mother and her baby.  °If you are pregnant and you have symptoms of an anxiety disorder, it is important to talk with your health care provider. °What are the causes? °The exact cause of this condition is not known. Hormonal changes during and after pregnancy may play a role in causing perinatal anxiety. °What increases the risk? °You are more likely to develop this condition if: °· You have a personal or family history of depression, anxiety, or mood disorders. °· You experience a stressful life event during pregnancy, such as the death of a loved one. °· You have a lot of regular life stress, such as being a single parent. °· You have thyroid problems. °What are the signs or symptoms? °Perinatal anxiety can be different for everyone. It may include: °· Panic attacks (panic disorder). These are intense episodes of fear or discomfort that may also cause sweating, nausea, shortness of breath, or fear of dying. They usually last 5-15 minutes. °· Reliving an upsetting (traumatic) event through distressing thoughts, dreams, or flashbacks (post-traumatic stress disorder, or PTSD). °· Excessive worry about multiple problems (generalized anxiety disorder). °· Fear and stress about leaving certain people or loved ones (separation anxiety). °· Performing repetitive tasks (compulsions) to relieve stress or worry (obsessive compulsive disorder, or OCD). °· Fear of certain objects or situations (phobias). °· Excessive worrying, such as a constant feeling that something bad is going to happen. °· Inability to relax. °· Difficulty concentrating. °· Sleep problems. °· Frequent nightmares or  disturbing thoughts. °How is this diagnosed? °This condition is diagnosed based on a physical exam and mental evaluation. In some cases, your health care provider may use an anxiety screening tool. These tools include a list of questions that can help a health care provider diagnose anxiety. Your health care provider may refer you to a mental health expert who specializes in anxiety. °How is this treated? °This condition may be treated with: °· Medicines. Your health care provider will only give you medicines that have been proven safe for pregnancy and breastfeeding. °· Talk therapy with a mental health professional to help change your patterns of thinking (cognitive behavioral therapy). °· Mindfulness-based stress reduction. °· Other relaxation therapies, such as deep breathing or guided muscle relaxation. °· Support groups. °Follow these instructions at home: °Lifestyle °· Do not use any products that contain nicotine or tobacco, such as cigarettes and e-cigarettes. If you need help quitting, ask your health care provider. °· Do not use alcohol when you are pregnant. After your baby is born, limit alcohol intake to no more than 1 drink a day. One drink equals 12 oz of beer, 5 oz of wine, or 1½ oz of hard liquor. °· Consider joining a support group for new mothers. Ask your health care provider for recommendations. °· Take good care of yourself. Make sure you: °? Get plenty of sleep. If you are having trouble sleeping, talk with your health care provider. °? Eat a healthy diet. This includes plenty of fruits and vegetables, whole grains, and lean proteins. °? Exercise regularly, as told by your health care provider. Ask your health care provider what exercises are safe for you. °General instructions °· Take over-the-counter   and prescription medicines only as told by your health care provider. °· Talk with your partner or family members about your feelings during pregnancy. Share any concerns or fears that you may  have. °· Ask for help with tasks or chores when you need it. Ask friends and family members to provide meals, watch your children, or help with cleaning. °· Keep all follow-up visits as told by your health care provider. This is important. °Contact a health care provider if: °· You (or people close to you) notice that you have any symptoms of anxiety or depression. °· You have anxiety and your symptoms get worse. °· You experience side effects from medicines, such as nausea or sleep problems. °Get help right away if: °· You feel like hurting yourself, your baby, or someone else. °If you ever feel like you may hurt yourself or others, or have thoughts about taking your own life, get help right away. You can go to your nearest emergency department or call: °· Your local emergency services (911 in the [place] °· A suicide crisis helpline, such as the National Suicide Prevention Lifeline at 1-800-273-8255. This is open 24 hours a day. °Summary °· Perinatal anxiety is when a woman feels excessive tension or worry during pregnancy or during the first 12 months after she gives birth. °· Perinatal anxiety may include panic attacks, post-traumatic stress disorder, separation anxiety, phobias, or generalized anxiety. °· Perinatal anxiety can cause physical health problems in the mother and baby if not properly managed. °· This condition is treated with medicines, talk therapy, stress reduction therapies, or a combination of two or more treatments. °· Talk with your partner or family members about your concerns or fears. Do not be afraid to ask for help. °This information is not intended to replace advice given to you by your health care provider. Make sure you discuss any questions you have with your health care provider. °Document Released: 06/20/2016 Document Revised: 06/20/2016 Document Reviewed: 06/20/2016 °Elsevier Interactive Patient Education © 2019 Elsevier Inc. ° °

## 2018-10-30 NOTE — Progress Notes (Signed)
ROB/US No concerns Denies lof, no vb, Good FM  

## 2018-10-30 NOTE — Progress Notes (Signed)
Routine Prenatal Care Visit  Subjective  Debra Mcdaniel is a 28 y.o. G2P1001 at [redacted]w[redacted]d being seen today for ongoing prenatal care.  She is currently monitored for the following issues for this high-risk pregnancy and has GAD (generalized anxiety disorder); Headache disorder; Rh negative status during pregnancy; Supervision of high-risk pregnancy; BMI 50.0-59.9, adult ([place]); History of cesarean delivery, currently pregnant; Intractable chronic migraine without aura and without status migrainosus; Irritable bowel syndrome with diarrhea; and Obesity affecting pregnancy, antepartum on their problem list.  ----------------------------------------------------------------------------------- Patient reports feeling like she was "hit by a truck" every morning when she wakes up. She feels tired and achy. The tiredness lasts throughout the day. The achiness resolves within an hour. She denies any autoimmune issues. She has also noticed an increase in anxiety especially related to her job. She prefers not to take medication.  She usually does not eat breakfast and then feels overly hungry at lunch time.  Contractions: Not present. Vag. Bleeding: None.  Movement: Present. Denies leaking of fluid.  ----------------------------------------------------------------------------------- The following portions of the patient's history were reviewed and updated as appropriate: allergies, current medications, past family history, past medical history, past social history, past surgical history and problem list. Problem list updated.   Objective  Blood pressure 128/80, weight (!) 320 lb (145.2 kg), last menstrual period 06/14/2018. Pregravid weight 305 lb (138.3 kg) Total Weight Gain 15 lb (6.804 kg) Urinalysis: Urine Protein    Urine Glucose    Fetal Status: Fetal Heart Rate (bpm): 144   Movement: Present       Patient Name: Debra Mcdaniel DOB: 30-Aug-1990 MRN: [id number]  ULTRASOUND REPORT  Location: [facility] Date of Service: 10/30/2018   Indications:Anatomy Ultrasound Findings:  Debra Mcdaniel intrauterine pregnancy is visualized with FHR at 144 BPM. Biometrics give an (U/S) Gestational age of [redacted]w[redacted]d and an (U/S) EDD of 03/17/2019; this correlates with the clinically established Estimated Date of Delivery: 03/21/19  Fetal presentation is Breech.  EFW: 364g ( 13 oz ). Placenta: posterior. Grade: 0 AFI: subjectively normal.  Anatomic survey is incomplete for  Ductal arch, LVOT, kidneys, profile, cord insertion into the placenta, 3 VV ; Gender - female.     Impression: 1. [redacted]w[redacted]d Viable Singleton Intrauterine pregnancy by U/S. 2. (U/S) EDD is consistent with Clinically established Estimated Date of Delivery: 03/21/19 . 3. Suboptimal ultrasound images. A follow up ultrasound is needed for the above mentioned anatomy. Normal fetal growth.   Recommendations: 1.Clinical correlation with the patient's History and Physical Exam.  Debra Mcdaniel, Debra Mcdaniel   General:  Alert, oriented and cooperative. Patient is in no acute distress.  Skin: Skin is warm and dry. No rash noted.   Cardiovascular: Normal heart rate noted  Respiratory: Normal respiratory effort, no problems with respiration noted  Abdomen: Soft, gravid, appropriate for gestational age. Pain/Pressure: Absent     Pelvic:  Cervical exam deferred        Extremities: Normal range of motion.     Mental Status: Normal mood and affect. Normal behavior. Normal judgment and thought content.   Assessment   28 y.o. G2P1001 at [redacted]w[redacted]d by  03/21/2019, by Last Menstrual Period presenting for routine prenatal visit  Plan   Pregnancy #2 Problems (from 08/25/18 to present)    Problem Noted Resolved   Supervision of high-risk pregnancy 08/29/2018 by Rexene Agent, CNM No   Overview Addendum 09/17/2018  7:47 AM by [name], [place]  Dating LMP = 12 week [place]  Blood type:     Genetic Screen NIPS: Normal XY  Inheritest: negative Antibody:   Anatomic US  Rubella:   Varicella:    GTT Early:               Third trimester:  RPR:     Rhogam  HBsAg:     TDaP vaccine                       Flu Shot: HIV:     Baby Food                                GBS:   Contraception  Pap:  CBB     CS/VBAC    Support Person              BMI 50.0-59.9, adult ([facility]CC) 08/29/2018 by Oswaldo ConroySchmid, Jacelyn Y, CNM No   Overview Signed 08/29/2018  1:28 PM by Oswaldo ConroySchmid, Jacelyn Y, CNM    BMI >=40 [ ]  early 1h gtt -  [ ]  u/s for dating [ ]   [ ]  nutritional goals [ ]  folic acid 1mg  [ ]  bASA (>12 weeks) [ ]  consider nutrition consult [ ]  consider maternal EKG 1st trimester [ ]  Growth u/s 28 [ ] , 32 [ ] , 36 weeks [ ]  [ ]  NST/AFI weekly 36+ weeks (36[] , 37[] , 38[] , 39[] , 40[] ) [ ]  IOL by 41 weeks (scheduled, prn [] )          Preterm labor symptoms and general obstetric precautions including but not limited to vaginal bleeding, contractions, leaking of fluid and fetal movement were reviewed in detail with the patient. Please refer to After Visit Summary for other counseling recommendations.   Increase healthy lifestyle for general well being- diet, hydration, exercise, fresh air/sunshine Epsom salt soaks, Magnesium supplement Coping techniques- music, relaxation, lavender essential oil, mindfulness/breathing Rx as needed for anxiety Eat small frequent meals with a source of protein at each  Return in about 4 weeks (around 11/27/2018) for follow up anatomy and rob.  Debra Mcdaniel, CNM 10/30/2018 3:55 PM

## 2018-11-17 ENCOUNTER — Telehealth: Payer: Self-pay

## 2018-11-17 NOTE — Telephone Encounter (Signed)
Pt calling for test results.  [phone] Left detailed msg the only test results I see is from 4/29.  Please call back with the name of the test(s).

## 2018-11-18 NOTE — Telephone Encounter (Signed)
Pt calling back; wanted to know what was done with her urine at her last visit - was it okay?  Adv it was normal for what we call 'short dip'.

## 2018-11-28 ENCOUNTER — Encounter: Payer: Self-pay | Admitting: Advanced Practice Midwife

## 2018-11-28 ENCOUNTER — Other Ambulatory Visit: Payer: Self-pay

## 2018-11-28 ENCOUNTER — Ambulatory Visit (INDEPENDENT_AMBULATORY_CARE_PROVIDER_SITE_OTHER): Payer: Managed Care, Other (non HMO) | Admitting: Advanced Practice Midwife

## 2018-11-28 ENCOUNTER — Ambulatory Visit (INDEPENDENT_AMBULATORY_CARE_PROVIDER_SITE_OTHER): Payer: Managed Care, Other (non HMO)

## 2018-11-28 VITALS — BP 146/68 | Wt 326.0 lb

## 2018-11-28 DIAGNOSIS — O0990 Supervision of high risk pregnancy, unspecified, unspecified trimester: Secondary | ICD-10-CM

## 2018-11-28 DIAGNOSIS — Z113 Encounter for screening for infections with a predominantly sexual mode of transmission: Secondary | ICD-10-CM

## 2018-11-28 DIAGNOSIS — Z131 Encounter for screening for diabetes mellitus: Secondary | ICD-10-CM

## 2018-11-28 DIAGNOSIS — Z362 Encounter for other antenatal screening follow-up: Secondary | ICD-10-CM | POA: Diagnosis not present

## 2018-11-28 DIAGNOSIS — O34219 Maternal care for unspecified type scar from previous cesarean delivery: Secondary | ICD-10-CM

## 2018-11-28 DIAGNOSIS — Z3A23 23 weeks gestation of pregnancy: Secondary | ICD-10-CM

## 2018-11-28 DIAGNOSIS — Z6791 Unspecified blood type, Rh negative: Secondary | ICD-10-CM

## 2018-11-28 DIAGNOSIS — Z130 Encounter for screening for diseases of the blood and blood-forming organs and certain disorders involving the immune mechanism: Secondary | ICD-10-CM

## 2018-11-28 DIAGNOSIS — O26892 Other specified pregnancy related conditions, second trimester: Secondary | ICD-10-CM

## 2018-11-28 DIAGNOSIS — O0992 Supervision of high risk pregnancy, unspecified, second trimester: Secondary | ICD-10-CM

## 2018-11-28 LAB — POCT URINALYSIS DIPSTICK OB
Glucose, UA: NEGATIVE
POC,PROTEIN,UA: NEGATIVE

## 2018-11-28 NOTE — Progress Notes (Signed)
ROB °Follow up anatomy scan °

## 2018-11-28 NOTE — Progress Notes (Addendum)
Routine Prenatal Care Visit  Subjective  Debra Mcdaniel is a 28 y.o. G2P1001 at [redacted]w[redacted]d being seen today for ongoing prenatal care.  She is currently monitored for the following issues for this high-risk pregnancy and has GAD (generalized anxiety disorder); Headache disorder; Rh negative status during pregnancy; Supervision of high-risk pregnancy; BMI 50.0-59.9, adult ([place]); History of cesarean delivery, currently pregnant; Intractable chronic migraine without aura and without status migrainosus; Irritable bowel syndrome with diarrhea; and Obesity affecting pregnancy, antepartum on their problem list.  ----------------------------------------------------------------------------------- Patient reports backache and leg cramps. She has chronic BV and currently has an odor but no discharge, itching or irritation. We discussed recommendations to prevent. Advised to call if symptoms worsen and she needs medication.   Contractions: Not present. Vag. Bleeding: None.  Movement: Present. Denies leaking of fluid.  ----------------------------------------------------------------------------------- The following portions of the patient's history were reviewed and updated as appropriate: allergies, current medications, past family history, past medical history, past social history, past surgical history and problem list. Problem list updated.   Objective  Blood pressure (!) 146/68, weight (!) 326 lb (147.9 kg), last menstrual period 06/14/2018. Pregravid weight 305 lb (138.3 kg) Total Weight Gain 21 lb (9.526 kg) Urinalysis: Urine Protein Negative  Urine Glucose Negative  Fetal Status: Fetal Heart Rate (bpm): 139   Movement: Present     Anatomy is still incomplete for 3 vv. Referral to [facility] for follow up. Otherwise anatomy is complete and normal.  General:  Alert, oriented and cooperative. Patient is in no acute distress.  Skin: Skin is warm and dry. No rash noted.   Cardiovascular: Normal heart rate noted   Respiratory: Normal respiratory effort, no problems with respiration noted  Abdomen: Soft, gravid, appropriate for gestational age. Pain/Pressure: Absent     Pelvic:  Cervical exam deferred        Extremities: Normal range of motion.  Edema: None  Mental Status: Normal mood and affect. Normal behavior. Normal judgment and thought content.   Assessment   28 y.o. G2P1001 at [redacted]w[redacted]d by  03/21/2019, by Last Menstrual Period presenting for routine prenatal visit  Plan   Pregnancy #2 Problems (from 08/25/18 to present)    Problem Noted Resolved   Supervision of high-risk pregnancy 08/29/2018 by Rexene Agent, CNM No   Overview Addendum 09/17/2018  7:47 AM by [name], MD    [facility] Prenatal Labs  Dating LMP = 12 week [place] Blood type:     Genetic Screen NIPS: Normal XY Inheritest: negative Antibody:   Anatomic [place]  Rubella:   Varicella:    GTT Early:               Third trimester:  RPR:     Rhogam  HBsAg:     TDaP vaccine                       Flu Shot: HIV:     Baby Food                                GBS:   Contraception  Pap:  CBB     CS/VBAC    Support Person              BMI 50.0-59.9, adult ([place]) 08/29/2018 by Rexene Agent, CNM No   Overview Signed 08/29/2018  1:28 PM by Rexene Agent, CNM    BMI >=40 [ ]  early  1h gtt -  [ ]  u/s for dating [ ]   [ ]  nutritional goals [ ]  folic acid 1mg  [ ]  bASA (>12 weeks) [ ]  consider nutrition consult [ ]  consider maternal EKG 1st trimester [ ]  Growth u/s 28 [ ] , 32 [ ] , 36 weeks [ ]  [ ]  NST/AFI weekly 36+ weeks (36[] , 37[] , 38[] , 39[] , 40[] ) [ ]  IOL by 41 weeks (scheduled, prn [] )          Preterm labor symptoms and general obstetric precautions including but not limited to vaginal bleeding, contractions, leaking of fluid and fetal movement were reviewed in detail with the patient. Please refer to After Visit Summary for other counseling recommendations. [facility] 3 vv    Return in about 4 weeks (around 12/26/2018) for 28 wk labs and rob.  Tresea MallJane Brelynn Wheller, CNM 11/28/2018 4:14 PM

## 2018-12-04 ENCOUNTER — Other Ambulatory Visit: Payer: Self-pay

## 2018-12-04 ENCOUNTER — Ambulatory Visit
Admission: RE | Admit: 2018-12-04 | Discharge: 2018-12-04 | Disposition: A | Discharge status: Home / Self Care | Payer: Managed Care, Other (non HMO) | Source: Ambulatory Visit | Attending: Obstetrics and Gynecology | Admitting: Obstetrics and Gynecology

## 2018-12-04 ENCOUNTER — Other Ambulatory Visit: Payer: Self-pay | Admitting: Advanced Practice Midwife

## 2018-12-04 DIAGNOSIS — R51 Headache: Secondary | ICD-10-CM | POA: Insufficient documentation

## 2018-12-04 DIAGNOSIS — O99210 Obesity complicating pregnancy, unspecified trimester: Secondary | ICD-10-CM | POA: Diagnosis present

## 2018-12-04 DIAGNOSIS — O99342 Other mental disorders complicating pregnancy, second trimester: Secondary | ICD-10-CM | POA: Diagnosis not present

## 2018-12-04 DIAGNOSIS — O0990 Supervision of high risk pregnancy, unspecified, unspecified trimester: Secondary | ICD-10-CM | POA: Diagnosis present

## 2018-12-04 DIAGNOSIS — O34219 Maternal care for unspecified type scar from previous cesarean delivery: Secondary | ICD-10-CM | POA: Diagnosis present

## 2018-12-04 DIAGNOSIS — E669 Obesity, unspecified: Secondary | ICD-10-CM | POA: Insufficient documentation

## 2018-12-04 DIAGNOSIS — K580 Irritable bowel syndrome with diarrhea: Secondary | ICD-10-CM | POA: Diagnosis present

## 2018-12-04 DIAGNOSIS — O26892 Other specified pregnancy related conditions, second trimester: Secondary | ICD-10-CM | POA: Diagnosis not present

## 2018-12-04 DIAGNOSIS — Z6791 Unspecified blood type, Rh negative: Secondary | ICD-10-CM

## 2018-12-04 DIAGNOSIS — F419 Anxiety disorder, unspecified: Secondary | ICD-10-CM | POA: Insufficient documentation

## 2018-12-04 DIAGNOSIS — Z0489 Encounter for examination and observation for other specified reasons: Secondary | ICD-10-CM

## 2018-12-04 DIAGNOSIS — K589 Irritable bowel syndrome without diarrhea: Secondary | ICD-10-CM | POA: Insufficient documentation

## 2018-12-04 DIAGNOSIS — O0992 Supervision of high risk pregnancy, unspecified, second trimester: Secondary | ICD-10-CM | POA: Insufficient documentation

## 2018-12-04 DIAGNOSIS — O99212 Obesity complicating pregnancy, second trimester: Secondary | ICD-10-CM | POA: Diagnosis not present

## 2018-12-04 DIAGNOSIS — O26899 Other specified pregnancy related conditions, unspecified trimester: Secondary | ICD-10-CM

## 2018-12-04 DIAGNOSIS — O99612 Diseases of the digestive system complicating pregnancy, second trimester: Secondary | ICD-10-CM | POA: Diagnosis not present

## 2018-12-04 DIAGNOSIS — Z6841 Body Mass Index (BMI) 40.0 and over, adult: Secondary | ICD-10-CM

## 2018-12-04 DIAGNOSIS — G43719 Chronic migraine without aura, intractable, without status migrainosus: Secondary | ICD-10-CM | POA: Diagnosis present

## 2018-12-04 DIAGNOSIS — IMO0002 Reserved for concepts with insufficient information to code with codable children: Secondary | ICD-10-CM

## 2018-12-04 DIAGNOSIS — Z3A25 25 weeks gestation of pregnancy: Secondary | ICD-10-CM | POA: Diagnosis not present

## 2018-12-04 DIAGNOSIS — R0989 Other specified symptoms and signs involving the circulatory and respiratory systems: Secondary | ICD-10-CM | POA: Diagnosis present

## 2018-12-04 DIAGNOSIS — F411 Generalized anxiety disorder: Secondary | ICD-10-CM

## 2018-12-04 DIAGNOSIS — R519 Headache, unspecified: Secondary | ICD-10-CM

## 2018-12-04 HISTORY — DX: Anxiety disorder, unspecified: F41.9

## 2018-12-04 MED ORDER — ASPIRIN 81 MG PO CHEW: 81.0000 mg | CHEWABLE_TABLET | Freq: Every day | ORAL | 2 refills | Status: DC

## 2018-12-04 MED ORDER — SERTRALINE HCL 50 MG PO TABS
50.0000 mg | ORAL_TABLET | Freq: Every day | ORAL | 2 refills | Status: DC
Start: 2018-12-04 — End: 2018-12-26

## 2018-12-04 NOTE — Progress Notes (Signed)
[facility] Maternal-Fetal [facility] Consultation   Chief Complaint: pregnancy with elevated bmi 59 , h/o cesarean section , rh neg , anxiety disorder  HPI: [name] is a 28 y.o. 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 Her husband Debra Mcdaniel tells her she snores. She had a home sleep study that was negative - early glucola was negative , her creatinine one year ago was 1.16 . She has not started baby aspirin - very anxious about use of medications. I told her that some L&D units have cut offs for BMI and labor - she says she knew that she might have to go somewhere else but is worried about living so far [place] [facility] [place] .  Cesarean 2014- pt feels like her pregnancy went well last time she experienced AROM the day before her due date and [facility] . Note say she had FTP after 13 hours in labor , 19 after ROM . She said it was a hard recovery and prefers to deliver vaginal but feels like "the baby has to make  the choice - if I go in labor that would be good , I would like to try"  Unclear that she perceives her higher risk as an emergency surgery   Anxiety - Pt says she doesn't "want to get hooked on medication - what if I ran out and couldn't get it? would I panic?" She has been offered Lexapro by dr Marcello FennelHande [facility]  She says her husband if "he were at the visit would tell her to try medication - he is always telling me to calm down . "  She says that her anxiety impacts her headaches and her gut problems .  Headaches - She says just about anything can trigger them, stress at work especially, she says she had a neg  MRI 2019 and has annual eye exams that are normal . They impact her quality of life and she would like to look further into causes and how to manage her HAs.  Her irritable bowel also impacts her life and she notices that stress makes this worse - she had normal GI work up .  Rh neg - pt knows about her shots   Labile BP - occ diastolic BP in the 140s we had difficutly fitting her cuff today - lower arm BP was normal - pt says that she has white coat HTN " when I go to the office and I haven't eaten its up".      Past Medical History: Patient  has a past medical history of Anxiety, BMI 50.0-59.9, adult (HCC), Depression, Headache, and IBS (irritable bowel syndrome).  Past Surgical History: She  has a past surgical history that includes MIrena  (02/2013); Cesarean section; Colonoscopy with propofol (N/A, 07/24/2017); and Esophagogastroduodenoscopy (egd) with propofol (N/A, 07/24/2017).  Obstetric History:  OB History    Gravida  2   Para  1   Term  1   Preterm  0   AB  0   Living  1     SAB  0  TAB  0   Ectopic  0   Multiple      Live Births  1         2014 cesarean FTP 8 lbs 10 oz 21 female via pfannenstiel , [facility] "[name] " Gynecologic History:  Patient's last menstrual period was 06/14/2018 (exact date).  H/o chronic BV - she has spoken with the midwife about local care and probiotics  Pap 2019 neg - yeast noted  Used a mirena in the past  Medications:   Current Outpatient Medications:  .  Prenatal Vit-Fe Fumarate-FA (MULTIVITAMIN-PRENATAL) 27-0.8 MG TABS tablet, Take 1 tablet by mouth daily at 12 noon., Disp: , Rfl:  Allergies: Patient is allergic to nickel.  Social History: Patient  reports that she has never smoked. She has never used smokeless tobacco. She reports previous alcohol use. She reports that she does not use drugs.  Family History: family history includes Hypertension in her maternal grandfather, maternal grandmother, mother, paternal  grandfather, and paternal grandmother; Hypothyroidism in her mother; Obesity in her mother; Thyroid disease in her mother; Vision loss in her father.  Review of Systems A full 12 point review of systems was negative or as noted in the History of Present Illness.  Physical Exam: 62 inches tall  Weight 324 ( 287lb 09/2016) (305 early pregnancy )  Temp 98.4 113/67 forearm - cuff didn't fit upper arm  rr 18  Pulse ox 100%  Obese female  Neck has good range of motion  Normal verifi cell free  LMP 06/14/2018 (Exact Date)    Asessement: IUP at 9 w 5d  1-BMI 45 - normal early glucola , neg home sleep study  2-H/o cesarean - pt has interest in a trial of labor due to difficult recovery, I  reviewed 1% risk of uterine rupture and challenges of emergency surgery in elevated BMI .  For FTP MFM vbac calculator 11% chance of success . I did not review current recommendations for postop lovenox ,  I encouraged use of baby aspirin  3-Rh neg - rhogam at 28 weeks  4-Anxiety disorder -  5-Irritable bowel syndrome s/p colonoscopy EGD 07/2017, neg h pylori  6-Chronic HA - migraine like , normal brain MRI - 06/2017  7- H/o creatinine of 1.16 one year ago   Plan: - VBAC - this needs further discussion given low chance of success by [facility] calculator I discouraged it  -Neurology consult for her HAs   Dr Quentin Angst and Dr Theone Murdoch at [facility] are both skilled at care in pregnancy)  If an LP is indicated that may be done though recommend they avoid fluoroscopy if LP suggested  -OB anesthesia consult - if they are unable to accommodate her for cesarean we can care [facility] at [facility]  -Aspirin 81 mg I reinforced the reasons we recommend it and I sent it in to her pharmacy  - baseline EKG - ordered  - screening TSH - ordered as future  -CMP baseline ( elevated creatinine in past) ordered as a future lab -Urine P/C baseline -ordered  - for our pts over BMI of 50 we recommend weekly testing at 36 weeks and delivery  at 39  Monthly uls for growth  in the third trimester -I  offered zoloft for her h/o anxiety  - this seems linked to her headaches and GI issues and causes her a great deal of difficulty - we discussed getting on I told her increase slowly to avoid GI issues and getting  off by tapering  and association with suicide in depressed teens - I reviewed that babies have been reported to  be more colicky , I did not review PFC as it is rare and association is poorly documented.    Total time spent with the patient was 30 minutes with greater than 50% spent in counseling and coordination of care. We appreciate this interesting consult and will be happy to be involved in the ongoing care of Ms. Lyday in anyway her obstetricians desire.  Jimmey RalphLivingston, Jamirah Zelaya, MD Maternal-Fetal Medicine [facility]

## 2018-12-08 ENCOUNTER — Other Ambulatory Visit: Payer: Self-pay

## 2018-12-08 DIAGNOSIS — O99210 Obesity complicating pregnancy, unspecified trimester: Secondary | ICD-10-CM

## 2018-12-09 ENCOUNTER — Other Ambulatory Visit: Payer: Managed Care, Other (non HMO)

## 2018-12-11 ENCOUNTER — Encounter: Payer: Self-pay | Admitting: Obstetrics and Gynecology

## 2018-12-11 ENCOUNTER — Ambulatory Visit
Admission: RE | Admit: 2018-12-11 | Discharge: 2018-12-11 | Disposition: A | Discharge status: Home / Self Care | Payer: Managed Care, Other (non HMO) | Source: Ambulatory Visit | Attending: Obstetrics and Gynecology | Admitting: Obstetrics and Gynecology

## 2018-12-11 ENCOUNTER — Other Ambulatory Visit: Payer: Self-pay

## 2018-12-11 DIAGNOSIS — O99210 Obesity complicating pregnancy, unspecified trimester: Secondary | ICD-10-CM | POA: Insufficient documentation

## 2018-12-26 ENCOUNTER — Encounter: Payer: Self-pay | Admitting: Advanced Practice Midwife

## 2018-12-26 ENCOUNTER — Ambulatory Visit (INDEPENDENT_AMBULATORY_CARE_PROVIDER_SITE_OTHER): Payer: Managed Care, Other (non HMO) | Admitting: Advanced Practice Midwife

## 2018-12-26 ENCOUNTER — Other Ambulatory Visit: Payer: Self-pay

## 2018-12-26 ENCOUNTER — Other Ambulatory Visit: Payer: Managed Care, Other (non HMO)

## 2018-12-26 VITALS — BP 130/80 | Wt 325.0 lb

## 2018-12-26 DIAGNOSIS — O0990 Supervision of high risk pregnancy, unspecified, unspecified trimester: Secondary | ICD-10-CM

## 2018-12-26 DIAGNOSIS — Z130 Encounter for screening for diseases of the blood and blood-forming organs and certain disorders involving the immune mechanism: Secondary | ICD-10-CM

## 2018-12-26 DIAGNOSIS — Z3A27 27 weeks gestation of pregnancy: Secondary | ICD-10-CM

## 2018-12-26 DIAGNOSIS — O34219 Maternal care for unspecified type scar from previous cesarean delivery: Secondary | ICD-10-CM

## 2018-12-26 DIAGNOSIS — Z131 Encounter for screening for diabetes mellitus: Secondary | ICD-10-CM

## 2018-12-26 DIAGNOSIS — Z113 Encounter for screening for infections with a predominantly sexual mode of transmission: Secondary | ICD-10-CM

## 2018-12-26 NOTE — Patient Instructions (Signed)
Third Trimester of Pregnancy The third trimester is from week 28 through week 40 (months 7 through 9). The third trimester is a time when the unborn baby (fetus) is growing rapidly. At the end of the ninth month, the fetus is about 20 inches in length and weighs 6-10 pounds. Body changes during your third trimester Your body will continue to go through many changes during pregnancy. The changes vary from woman to woman. During the third trimester:  Your weight will continue to increase. You can expect to gain 25-35 pounds (11-16 kg) by the end of the pregnancy.  You may begin to get stretch marks on your hips, abdomen, and breasts.  You may urinate more often because the fetus is moving lower into your pelvis and pressing on your bladder.  You may develop or continue to have heartburn. This is caused by increased hormones that slow down muscles in the digestive tract.  You may develop or continue to have constipation because increased hormones slow digestion and cause the muscles that push waste through your intestines to relax.  You may develop hemorrhoids. These are swollen veins (varicose veins) in the rectum that can itch or be painful.  You may develop swollen, bulging veins (varicose veins) in your legs.  You may have increased body aches in the pelvis, back, or thighs. This is due to weight gain and increased hormones that are relaxing your joints.  You may have changes in your hair. These can include thickening of your hair, rapid growth, and changes in texture. Some women also have hair loss during or after pregnancy, or hair that feels dry or thin. Your hair will most likely return to normal after your baby is born.  Your breasts will continue to grow and they will continue to become tender. A yellow fluid (colostrum) may leak from your breasts. This is the first milk you are producing for your baby.  Your belly button may stick out.  You may notice more swelling in your hands,  face, or ankles.  You may have increased tingling or numbness in your hands, arms, and legs. The skin on your belly may also feel numb.  You may feel short of breath because of your expanding uterus.  You may have more problems sleeping. This can be caused by the size of your belly, increased need to urinate, and an increase in your body's metabolism.  You may notice the fetus "dropping," or moving lower in your abdomen (lightening).  You may have increased vaginal discharge.  You may notice your joints feel loose and you may have pain around your pelvic bone. What to expect at prenatal visits You will have prenatal exams every 2 weeks until week 36. Then you will have weekly prenatal exams. During a routine prenatal visit:  You will be weighed to make sure you and the baby are growing normally.  Your blood pressure will be taken.  Your abdomen will be measured to track your baby's growth.  The fetal heartbeat will be listened to.  Any test results from the previous visit will be discussed.  You may have a cervical check near your due date to see if your cervix has softened or thinned (effaced).  You will be tested for Group B streptococcus. This happens between 35 and 37 weeks. Your health care provider may ask you:  What your birth plan is.  How you are feeling.  If you are feeling the baby move.  If you have had any abnormal   symptoms, such as leaking fluid, bleeding, severe headaches, or abdominal cramping.  If you are using any tobacco products, including cigarettes, chewing tobacco, and electronic cigarettes.  If you have any questions. Other tests or screenings that may be performed during your third trimester include:  Blood tests that check for low iron levels (anemia).  Fetal testing to check the health, activity level, and growth of the fetus. Testing is done if you have certain medical conditions or if there are problems during the pregnancy.  Nonstress test  (NST). This test checks the health of your baby to make sure there are no signs of problems, such as the baby not getting enough oxygen. During this test, a belt is placed around your belly. The baby is made to move, and its heart rate is monitored during movement. What is false labor? False labor is a condition in which you feel small, irregular tightenings of the muscles in the womb (contractions) that usually go away with rest, changing position, or drinking water. These are called Braxton Hicks contractions. Contractions may last for hours, days, or even weeks before true labor sets in. If contractions come at regular intervals, become more frequent, increase in intensity, or become painful, you should see your health care provider. What are the signs of labor?  Abdominal cramps.  Regular contractions that start at 10 minutes apart and become stronger and more frequent with time.  Contractions that start on the top of the uterus and spread down to the lower abdomen and back.  Increased pelvic pressure and dull back pain.  A watery or bloody mucus discharge that comes from the vagina.  Leaking of amniotic fluid. This is also known as your "water breaking." It could be a slow trickle or a gush. Let your health care provider know if it has a color or strange odor. If you have any of these signs, call your health care provider right away, even if it is before your due date. Follow these instructions at home: Medicines  Follow your health care provider's instructions regarding medicine use. Specific medicines may be either safe or unsafe to take during pregnancy.  Take a prenatal vitamin that contains at least 600 micrograms (mcg) of folic acid.  If you develop constipation, try taking a stool softener if your health care provider approves. Eating and drinking   Eat a balanced diet that includes fresh fruits and vegetables, whole grains, good sources of protein such as meat, eggs, or tofu,  and low-fat dairy. Your health care provider will help you determine the amount of weight gain that is right for you.  Avoid raw meat and uncooked cheese. These carry germs that can cause birth defects in the baby.  If you have low calcium intake from food, talk to your health care provider about whether you should take a daily calcium supplement.  Eat four or five small meals rather than three large meals a day.  Limit foods that are high in fat and processed sugars, such as fried and sweet foods.  To prevent constipation: ? Drink enough fluid to keep your urine clear or pale yellow. ? Eat foods that are high in fiber, such as fresh fruits and vegetables, whole grains, and beans. Activity  Exercise only as directed by your health care provider. Most women can continue their usual exercise routine during pregnancy. Try to exercise for 30 minutes at least 5 days a week. Stop exercising if you experience uterine contractions.  Avoid heavy lifting.  Do   not exercise in extreme heat or humidity, or at high altitudes.  Wear low-heel, comfortable shoes.  Practice good posture.  You may continue to have sex unless your health care provider tells you otherwise. Relieving pain and discomfort  Take frequent breaks and rest with your legs elevated if you have leg cramps or low back pain.  Take warm sitz baths to soothe any pain or discomfort caused by hemorrhoids. Use hemorrhoid cream if your health care provider approves.  Wear a good support bra to prevent discomfort from breast tenderness.  If you develop varicose veins: ? Wear support pantyhose or compression stockings as told by your healthcare provider. ? Elevate your feet for 15 minutes, 3-4 times a day. Prenatal care  Write down your questions. Take them to your prenatal visits.  Keep all your prenatal visits as told by your health care provider. This is important. Safety  Wear your seat belt at all times when driving.  Make  a list of emergency phone numbers, including numbers for family, friends, the hospital, and police and fire departments. General instructions  Avoid cat litter boxes and soil used by cats. These carry germs that can cause birth defects in the baby. If you have a cat, ask someone to clean the litter box for you.  Do not travel far distances unless it is absolutely necessary and only with the approval of your health care provider.  Do not use hot tubs, steam rooms, or saunas.  Do not drink alcohol.  Do not use any products that contain nicotine or tobacco, such as cigarettes and e-cigarettes. If you need help quitting, ask your health care provider.  Do not use any medicinal herbs or unprescribed drugs. These chemicals affect the formation and growth of the baby.  Do not douche or use tampons or scented sanitary pads.  Do not cross your legs for long periods of time.  To prepare for the arrival of your baby: ? Take prenatal classes to understand, practice, and ask questions about labor and delivery. ? Make a trial run to the hospital. ? Visit the hospital and tour the maternity area. ? Arrange for maternity or paternity leave through employers. ? Arrange for family and friends to take care of pets while you are in the hospital. ? Purchase a rear-facing car seat and make sure you know how to install it in your car. ? Pack your hospital bag. ? Prepare the baby's nursery. Make sure to remove all pillows and stuffed animals from the baby's crib to prevent suffocation.  Visit your dentist if you have not gone during your pregnancy. Use a soft toothbrush to brush your teeth and be gentle when you floss. Contact a health care provider if:  You are unsure if you are in labor or if your water has broken.  You become dizzy.  You have mild pelvic cramps, pelvic pressure, or nagging pain in your abdominal area.  You have lower back pain.  You have persistent nausea, vomiting, or diarrhea.   You have an unusual or bad smelling vaginal discharge.  You have pain when you urinate. Get help right away if:  Your water breaks before 37 weeks.  You have regular contractions less than 5 minutes apart before 37 weeks.  You have a fever.  You are leaking fluid from your vagina.  You have spotting or bleeding from your vagina.  You have severe abdominal pain or cramping.  You have rapid weight loss or weight gain.  You have   shortness of breath with chest pain.  You notice sudden or extreme swelling of your face, hands, ankles, feet, or legs.  Your baby makes fewer than 10 movements in 2 hours.  You have severe headaches that do not go away when you take medicine.  You have vision changes. Summary  The third trimester is from week 28 through week 40, months 7 through 9. The third trimester is a time when the unborn baby (fetus) is growing rapidly.  During the third trimester, your discomfort may increase as you and your baby continue to gain weight. You may have abdominal, leg, and back pain, sleeping problems, and an increased need to urinate.  During the third trimester your breasts will keep growing and they will continue to become tender. A yellow fluid (colostrum) may leak from your breasts. This is the first milk you are producing for your baby.  False labor is a condition in which you feel small, irregular tightenings of the muscles in the womb (contractions) that eventually go away. These are called Braxton Hicks contractions. Contractions may last for hours, days, or even weeks before true labor sets in.  Signs of labor can include: abdominal cramps; regular contractions that start at 10 minutes apart and become stronger and more frequent with time; watery or bloody mucus discharge that comes from the vagina; increased pelvic pressure and dull back pain; and leaking of amniotic fluid. This information is not intended to replace advice given to you by your health  care provider. Make sure you discuss any questions you have with your health care provider. Document Released: 04/17/2001 Document Revised: 08/14/2018 Document Reviewed: 05/29/2016 Elsevier Patient Education  2020 [employer]  

## 2018-12-26 NOTE — Progress Notes (Signed)
Routine Prenatal Care Visit  Subjective  Debra Mcdaniel is a 28 y.o. G2P1001 at [redacted]w[redacted]d being seen today for ongoing prenatal care.  She is currently monitored for the following issues for this high-risk pregnancy and has GAD (generalized anxiety disorder); Headache disorder; Rh negative status during pregnancy; Supervision of high-risk pregnancy; BMI 50.0-59.9, adult ([place]); History of cesarean delivery, currently pregnant; Intractable chronic migraine without aura and without status migrainosus; Irritable bowel syndrome with diarrhea; Obesity affecting pregnancy, antepartum; and Labile blood pressure on their problem list.  ----------------------------------------------------------------------------------- Patient reports having gag reflex when taking baby ASA. Suggested putting it in applesauce or yogurt.   Contractions: Not present. Vag. Bleeding: None.  Movement: Present. Denies leaking of fluid.  ----------------------------------------------------------------------------------- The following portions of the patient's history were reviewed and updated as appropriate: allergies, current medications, past family history, past medical history, past social history, past surgical history and problem list. Problem list updated.   Objective  Blood pressure 130/80, weight (!) 325 lb (147.4 kg), last menstrual period 06/14/2018. Pregravid weight 305 lb (138.3 kg) Total Weight Gain 20 lb (9.072 kg) Urinalysis: Urine Protein    Urine Glucose    Fetal Status: Fetal Heart Rate (bpm): 137   Movement: Present     General:  Alert, oriented and cooperative. Patient is in no acute distress.  Skin: Skin is warm and dry. No rash noted.   Cardiovascular: Normal heart rate noted  Respiratory: Normal respiratory effort, no problems with respiration noted  Abdomen: Soft, gravid, appropriate for gestational age. Pain/Pressure: Absent     Pelvic:  Cervical exam deferred        Extremities: Normal range of  motion.     Mental Status: Normal mood and affect. Normal behavior. Normal judgment and thought content.   Assessment   28 y.o. G2P1001 at [redacted]w[redacted]d by  03/21/2019, by Last Menstrual Period presenting for routine prenatal visit  Plan   Pregnancy #2 Problems (from 08/25/18 to present)    Problem Noted Resolved   Supervision of high-risk pregnancy 08/29/2018 by Rexene Agent, CNM No   Overview Addendum 09/17/2018  7:47 AM by [name], MD    [facility] Prenatal Labs  Dating LMP = 12 week [place] Blood type:     Genetic Screen NIPS: Normal XY Inheritest: negative Antibody:   Anatomic [place]  Rubella:   Varicella:    GTT Early:               Third trimester:  RPR:     Rhogam  HBsAg:     TDaP vaccine                       Flu Shot: HIV:     Baby Food                                GBS:   Contraception  Pap:  CBB     CS/VBAC    Support Person              BMI 50.0-59.9, adult ([place]) 08/29/2018 by Rexene Agent, CNM No   Overview Signed 08/29/2018  1:28 PM by Rexene Agent, CNM    BMI >=40 [ ]  early 1h gtt -  [ ]  u/s for dating [ ]   [ ]  nutritional goals [ ]  folic acid 1mg  [ ]  bASA (>12 weeks) [ ]  consider nutrition consult [ ]  consider maternal EKG  1st trimester [ ]  Growth u/s 28 [ ] , 32 [ ] , 36 weeks [ ]  [ ]  NST/AFI weekly 36+ weeks (36[] , 37[] , 38[] , 39[] , 40[] ) [ ]  IOL by 41 weeks (scheduled, prn [] )          Preterm labor symptoms and general obstetric precautions including but not limited to vaginal bleeding, contractions, leaking of fluid and fetal movement were reviewed in detail with the patient. Please refer to After Visit Summary for other counseling recommendations.   Return in about 2 weeks (around 01/09/2019) for growth and rob.  Tresea MallJane Hancel Ion, CNM 12/26/2018 3:40 PM

## 2018-12-26 NOTE — Progress Notes (Signed)
ROB/28 week labs C/o some pain around c-section scar, having trouble keeping Asprin down

## 2018-12-27 LAB — 28 WEEKS RH-PANEL
Antibody Screen: NEGATIVE
Basophils Absolute: 0 10*3/uL (ref 0.0–0.2)
Basos: 0 %
EOS (ABSOLUTE): 0.1 10*3/uL (ref 0.0–0.4)
Eos: 1 %
Gestational Diabetes Screen: 137 mg/dL (ref 65–139)
HIV Screen 4th Generation wRfx: NONREACTIVE
Hematocrit: 32.9 % — ABNORMAL LOW (ref 34.0–46.6)
Hemoglobin: 10.9 g/dL — ABNORMAL LOW (ref 11.1–15.9)
Immature Grans (Abs): 0.1 10*3/uL (ref 0.0–0.1)
Immature Granulocytes: 1 %
Lymphocytes Absolute: 1.9 10*3/uL (ref 0.7–3.1)
Lymphs: 20 %
MCH: 30.4 pg (ref 26.6–33.0)
MCHC: 33.1 g/dL (ref 31.5–35.7)
MCV: 92 fL (ref 79–97)
Monocytes Absolute: 0.7 10*3/uL (ref 0.1–0.9)
Monocytes: 7 %
Neutrophils Absolute: 7.2 10*3/uL — ABNORMAL HIGH (ref 1.4–7.0)
Neutrophils: 71 %
Platelets: 333 10*3/uL (ref 150–450)
RBC: 3.58 x10E6/uL — ABNORMAL LOW (ref 3.77–5.28)
RDW: 12.9 % (ref 11.7–15.4)
RPR Ser Ql: NONREACTIVE
WBC: 9.9 10*3/uL (ref 3.4–10.8)

## 2018-12-29 ENCOUNTER — Other Ambulatory Visit: Payer: Self-pay

## 2018-12-29 DIAGNOSIS — O99210 Obesity complicating pregnancy, unspecified trimester: Secondary | ICD-10-CM

## 2019-01-01 ENCOUNTER — Other Ambulatory Visit: Payer: Self-pay

## 2019-01-01 ENCOUNTER — Ambulatory Visit
Admission: RE | Admit: 2019-01-01 | Discharge: 2019-01-01 | Disposition: A | Discharge status: Home / Self Care | Payer: Managed Care, Other (non HMO) | Source: Ambulatory Visit | Attending: Obstetrics and Gynecology | Admitting: Obstetrics and Gynecology

## 2019-01-01 DIAGNOSIS — Z3A28 28 weeks gestation of pregnancy: Secondary | ICD-10-CM | POA: Diagnosis not present

## 2019-01-01 DIAGNOSIS — Z3687 Encounter for antenatal screening for uncertain dates: Secondary | ICD-10-CM | POA: Insufficient documentation

## 2019-01-01 DIAGNOSIS — O99210 Obesity complicating pregnancy, unspecified trimester: Secondary | ICD-10-CM

## 2019-01-01 DIAGNOSIS — O99212 Obesity complicating pregnancy, second trimester: Secondary | ICD-10-CM | POA: Diagnosis not present

## 2019-01-08 ENCOUNTER — Encounter: Payer: Managed Care, Other (non HMO) | Admitting: Advanced Practice Midwife

## 2019-01-08 ENCOUNTER — Ambulatory Visit: Payer: Managed Care, Other (non HMO)

## 2019-01-09 ENCOUNTER — Ambulatory Visit (INDEPENDENT_AMBULATORY_CARE_PROVIDER_SITE_OTHER): Payer: Managed Care, Other (non HMO) | Admitting: Advanced Practice Midwife

## 2019-01-09 ENCOUNTER — Encounter: Payer: Managed Care, Other (non HMO) | Admitting: Advanced Practice Midwife

## 2019-01-09 ENCOUNTER — Ambulatory Visit: Payer: Managed Care, Other (non HMO)

## 2019-01-09 ENCOUNTER — Other Ambulatory Visit: Payer: Self-pay

## 2019-01-09 ENCOUNTER — Encounter: Payer: Self-pay | Admitting: Advanced Practice Midwife

## 2019-01-09 VITALS — BP 120/80 | Wt 329.0 lb

## 2019-01-09 DIAGNOSIS — Z6791 Unspecified blood type, Rh negative: Secondary | ICD-10-CM

## 2019-01-09 DIAGNOSIS — O26893 Other specified pregnancy related conditions, third trimester: Secondary | ICD-10-CM

## 2019-01-09 DIAGNOSIS — O0990 Supervision of high risk pregnancy, unspecified, unspecified trimester: Secondary | ICD-10-CM

## 2019-01-09 DIAGNOSIS — Z3A29 29 weeks gestation of pregnancy: Secondary | ICD-10-CM

## 2019-01-09 LAB — POCT URINALYSIS DIPSTICK OB: Glucose, UA: NEGATIVE

## 2019-01-09 MED ORDER — RHO D IMMUNE GLOBULIN 1500 UNIT/2ML IJ SOSY
300.0000 ug | PREFILLED_SYRINGE | Freq: Once | INTRAMUSCULAR | Status: AC
  Administered 2019-01-09: 300 ug via INTRAMUSCULAR

## 2019-01-09 MED ORDER — RHO D IMMUNE GLOBULIN 1500 UNITS IM SOSY: 1500.0000 [IU] | PREFILLED_SYRINGE | Freq: Once | INTRAMUSCULAR | Status: DC

## 2019-01-09 NOTE — Progress Notes (Signed)
Routine Prenatal Care Visit  Subjective  Debra Mcdaniel is a 28 y.o. G2P1001 at 8567w6d being seen today for ongoing prenatal care.  She is currently monitored for the following issues for this high-risk pregnancy and has GAD (generalized anxiety disorder); Headache disorder; Rh negative status during pregnancy; Supervision of high-risk pregnancy; BMI 50.0-59.9, adult ([facility]); History of cesarean delivery, currently pregnant; Intractable chronic migraine without aura and without status migrainosus; Irritable bowel syndrome with diarrhea; Obesity affecting pregnancy, antepartum; and Labile blood pressure on their problem list.  ----------------------------------------------------------------------------------- Patient reports no complaints.   Contractions: Not present. Vag. Bleeding: None.  Movement: Present. Leaking Fluid denies.  ----------------------------------------------------------------------------------- The following portions of the patient's history were reviewed and updated as appropriate: allergies, current medications, past family history, past medical history, past social history, past surgical history and problem list. Problem list updated.  Objective  Blood pressure 120/80, weight (!) 329 lb (149.2 kg), last menstrual period 06/14/2018. Pregravid weight 305 lb (138.3 kg) Total Weight Gain 24 lb (10.9 kg) Urinalysis: Urine Protein Trace  Urine Glucose Negative  Fetal Status: Fetal Heart Rate (bpm): 145   Movement: Present     General:  Alert, oriented and cooperative. Patient is in no acute distress.  Skin: Skin is warm and dry. No rash noted.   Cardiovascular: Normal heart rate noted  Respiratory: Normal respiratory effort, no problems with respiration noted  Abdomen: Soft, gravid, appropriate for gestational age. Pain/Pressure: Absent     Pelvic:  Cervical exam deferred        Extremities: Normal range of motion.     Mental Status: Normal mood and affect. Normal behavior.  Normal judgment and thought content.   Assessment   28 y.o. G2P1001 at 6367w6d by  03/21/2019, by Last Menstrual Period presenting for routine prenatal visit  Plan   Pregnancy #2 Problems (from 08/25/18 to present)    Problem Noted Resolved   Supervision of high-risk pregnancy 08/29/2018 by Oswaldo ConroySchmid, Jacelyn Y, CNM No   Overview Addendum 01/09/2019  4:54 PM by Tresea MallGledhill, Kiele Heavrin, CNM    [facility] Prenatal Labs  Dating LMP = 12 week [place] Blood type:     Genetic Screen NIPS: Normal XY Inheritest: negative Antibody:   Anatomic US  Rubella:   Varicella:    GTT Early:               Third trimester:  RPR:     Rhogam 01/09/19 HBsAg:     TDaP vaccine                       Flu Shot: HIV:     Baby Food                                GBS:   Contraception  Pap:  CBB     CS/VBAC    Support Person              BMI 50.0-59.9, adult ([facility]) 08/29/2018 by Oswaldo ConroySchmid, Jacelyn Y, CNM No   Overview Signed 08/29/2018  1:28 PM by Oswaldo ConroySchmid, Jacelyn Y, CNM    BMI >=40 [ ]  early 1h gtt -  [ ]  u/s for dating [ ]   [ ]  nutritional goals [ ]  folic acid 1mg  [ ]  bASA (>12 weeks) [ ]  consider nutrition consult [ ]  consider maternal EKG 1st trimester [ ]  Growth u/s 28 [ ] , 32 [ ] , 36 weeks [ ]  [ ]   NST/AFI weekly 36+ weeks (36[] , 37[] , 38[] , 39[] , 40[] ) [ ]  IOL by 41 weeks (scheduled, prn [] )        Growth scan last week on 8/27 with MFM:  36%, 2 pounds 12 ounce, normal appearing anatomy, AFI: 15.8  Preterm labor symptoms and general obstetric precautions including but not limited to vaginal bleeding, contractions, leaking of fluid and fetal movement were reviewed in detail with the patient. Please refer to After Visit Summary for other counseling recommendations.   Return in about 2 weeks (around 01/23/2019) for growth scan and rob.  Rod Can, CNM 01/09/2019 4:55 PM

## 2019-01-19 ENCOUNTER — Other Ambulatory Visit: Payer: Self-pay | Admitting: Advanced Practice Midwife

## 2019-01-19 DIAGNOSIS — O0990 Supervision of high risk pregnancy, unspecified, unspecified trimester: Secondary | ICD-10-CM

## 2019-01-19 NOTE — Progress Notes (Unsigned)
Growth scan order placed

## 2019-01-23 ENCOUNTER — Ambulatory Visit (INDEPENDENT_AMBULATORY_CARE_PROVIDER_SITE_OTHER): Payer: Managed Care, Other (non HMO)

## 2019-01-23 ENCOUNTER — Encounter: Payer: Self-pay | Admitting: Advanced Practice Midwife

## 2019-01-23 ENCOUNTER — Ambulatory Visit (INDEPENDENT_AMBULATORY_CARE_PROVIDER_SITE_OTHER): Payer: Managed Care, Other (non HMO) | Admitting: Advanced Practice Midwife

## 2019-01-23 ENCOUNTER — Other Ambulatory Visit: Payer: Self-pay

## 2019-01-23 VITALS — BP 126/58 | Wt 329.0 lb

## 2019-01-23 DIAGNOSIS — Z362 Encounter for other antenatal screening follow-up: Secondary | ICD-10-CM

## 2019-01-23 DIAGNOSIS — Z6791 Unspecified blood type, Rh negative: Secondary | ICD-10-CM

## 2019-01-23 DIAGNOSIS — O26893 Other specified pregnancy related conditions, third trimester: Secondary | ICD-10-CM

## 2019-01-23 DIAGNOSIS — Z3A31 31 weeks gestation of pregnancy: Secondary | ICD-10-CM

## 2019-01-23 DIAGNOSIS — O0990 Supervision of high risk pregnancy, unspecified, unspecified trimester: Secondary | ICD-10-CM

## 2019-01-23 LAB — POCT URINALYSIS DIPSTICK OB: Glucose, UA: NEGATIVE

## 2019-01-23 NOTE — Progress Notes (Signed)
Routine Prenatal Care Visit  Subjective  Debra Mcdaniel is a 28 y.o. G2P1001 at 2041w6d being seen today for ongoing prenatal care.  She is currently monitored for the following issues for this high-risk pregnancy and has GAD (generalized anxiety disorder); Headache disorder; Rh negative status during pregnancy; Supervision of high-risk pregnancy; BMI 50.0-59.9, adult ([facility]); History of cesarean delivery, currently pregnant; Intractable chronic migraine without aura and without status migrainosus; Irritable bowel syndrome with diarrhea; Obesity affecting pregnancy, antepartum; and Labile blood pressure on their problem list.  ----------------------------------------------------------------------------------- Patient reports pelvic pressure.  She is still considering mode of delivery options. She is unsure if she wants to try for VBAC or to have repeat c/s. She would like to avoid a c/s recovery. Discussed the timing of delivery. Contractions: Not present. Vag. Bleeding: None.  Movement: Present. Leaking Fluid denies.  ----------------------------------------------------------------------------------- The following portions of the patient's history were reviewed and updated as appropriate: allergies, current medications, past family history, past medical history, past social history, past surgical history and problem list. Problem list updated.  Objective  Blood pressure (!) 126/58, weight (!) 329 lb (149.2 kg), last menstrual period 06/14/2018. Pregravid weight 305 lb (138.3 kg) Total Weight Gain 24 lb (10.9 kg) Urinalysis: Urine Protein Small (1+)  Urine Glucose Negative  Fetal Status: Fetal Heart Rate (bpm): 142   Movement: Present     Growth scan today: 46.9%, 4 pounds 1 ounce, AFI 12.4, Cephalic  General:  Alert, oriented and cooperative. Patient is in no acute distress.  Skin: Skin is warm and dry. No rash noted.   Cardiovascular: Normal heart rate noted  Respiratory: Normal respiratory  effort, no problems with respiration noted  Abdomen: Soft, gravid, appropriate for gestational age. Pain/Pressure: Present     Pelvic:  Cervical exam deferred        Extremities: Normal range of motion.     Mental Status: Normal mood and affect. Normal behavior. Normal judgment and thought content.   Assessment   28 y.o. G2P1001 at 5241w6d by  03/21/2019, by Last Menstrual Period presenting for routine prenatal visit  Plan   Pregnancy #2 Problems (from 08/25/18 to present)    Problem Noted Resolved   Supervision of high-risk pregnancy 08/29/2018 by Oswaldo ConroySchmid, Jacelyn Y, CNM No   Overview Addendum 01/09/2019  4:54 PM by Tresea MallGledhill, Saylor Sheckler, CNM    [facility] Prenatal Labs  Dating LMP = 12 week US Blood type:     Genetic Screen NIPS: Normal XY Inheritest: negative Antibody:   Anatomic US  Rubella:   Varicella:    GTT Early:               Third trimester:  RPR:     Rhogam 01/09/19 HBsAg:     TDaP vaccine                       Flu Shot: HIV:     Baby Food                                GBS:   Contraception  Pap:  CBB     CS/VBAC    Support Person              BMI 50.0-59.9, adult ([facility]) 08/29/2018 by Oswaldo ConroySchmid, Jacelyn Y, CNM No   Overview Signed 08/29/2018  1:28 PM by Oswaldo ConroySchmid, Jacelyn Y, CNM    BMI >=40 [ ]  early 1h gtt -  [ ]   u/s for dating [ ]   [ ]  nutritional goals [ ]  folic acid 1mg  [ ]  bASA (>12 weeks) [ ]  consider nutrition consult [ ]  consider maternal EKG 1st trimester [ ]  Growth u/s 28 [ ] , 32 [ ] , 36 weeks [ ]  [ ]  NST/AFI weekly 36+ weeks (36[] , 37[] , 38[] , 39[] , 40[] ) [ ]  IOL by 41 weeks (scheduled, prn [] )          Preterm labor symptoms and general obstetric precautions including but not limited to vaginal bleeding, contractions, leaking of fluid and fetal movement were reviewed in detail with the patient. Please refer to After Visit Summary for other counseling recommendations.   Return in about 2 weeks (around 02/06/2019) for rob.  Rod Can, CNM 01/23/2019 4:25  PM

## 2019-01-23 NOTE — Progress Notes (Signed)
ROB  Growth scan 

## 2019-01-23 NOTE — Patient Instructions (Signed)
Vaginal Birth After Cesarean Delivery  Vaginal birth after cesarean delivery (VBAC) is giving birth vaginally after previously delivering a baby through a cesarean section (C-section). A VBAC may be a safe option for you, depending on your health and other factors. It is important to discuss VBAC with your health care provider early in your pregnancy so you can understand the risks, benefits, and options. Having these discussions early will give you time to make your birth plan. Who are the best candidates for VBAC? The best candidates for VBAC are women who:  Have had one or two prior cesarean deliveries, and the incision made during the delivery was horizontal (low transverse).  Do not have a vertical (classical) scar on their uterus.  Have not had a tear in the wall of their uterus (uterine rupture).  Plan to have more pregnancies. A VBAC is also more likely to be successful:  In women who have previously given birth vaginally.  When labor starts by itself (spontaneously) before the due date. What are the benefits of VBAC? The benefits of delivering your baby vaginally instead of by a cesarean delivery include:  A shorter hospital stay.  A faster recovery time.  Less pain.  Avoiding risks associated with major surgery, such as infection and blood clots.  Less blood loss and less need for donated blood (transfusions). What are the risks of VBAC? The main risk of attempting a VBAC is that it may fail, forcing your health care provider to deliver your baby by a C-section. Other risks are rare and include:  Tearing (rupture) of the scar from a past cesarean delivery.  Other risks associated with vaginal deliveries. If a repeat cesarean delivery is needed, the risks include:  Blood loss.  Infection.  Blood clot.  Damage to surrounding organs.  Removal of the uterus (hysterectomy), if it is damaged.  Placenta problems in future pregnancies. What else should I know  about my options? Delivering a baby through a VBAC is similar to having a normal spontaneous vaginal delivery. Therefore, it is safe:  To try with twins.  For your health care provider to try to turn the baby from a breech position (external cephalic version) during labor.  With epidural analgesia for pain relief. Consider where you would like to deliver your baby. VBAC should be attempted in facilities where an emergency cesarean delivery can be performed. VBAC is not recommended for home births. Any changes in your health or your baby's health during your pregnancy may make it necessary to change your initial decision about VBAC. Your health care provider may recommend that you do not attempt a VBAC if:  Your baby's suspected weight is 8.8 lb (4 kg) or more.  You have preeclampsia. This is a condition that causes high blood pressure along with other symptoms, such as swelling and headaches.  You will have VBAC less than 19 months after your cesarean delivery.  You are past your due date.  You need to have labor started (induced) because your cervix is not ready for labor (unfavorable). Where to find more information  American Pregnancy Association: americanpregnancy.org  American Congress of Obstetricians and Gynecologists: acog.org Summary  Vaginal birth after cesarean delivery (VBAC) is giving birth vaginally after previously delivering a baby through a cesarean section (C-section). A VBAC may be a safe option for you, depending on your health and other factors.  Discuss VBAC with your health care provider early in your pregnancy so you can understand the risks, benefits, options, and   have plenty of time to make your birth plan.  The main risk of attempting a VBAC is that it may fail, forcing your health care provider to deliver your baby by a C-section. Other risks are rare. This information is not intended to replace advice given to you by your health care provider. Make sure  you discuss any questions you have with your health care provider. Document Released: 10/14/2006 Document Revised: 08/19/2018 Document Reviewed: 07/31/2016 Elsevier Patient Education  2020 Elsevier Inc.  

## 2019-02-06 ENCOUNTER — Ambulatory Visit (INDEPENDENT_AMBULATORY_CARE_PROVIDER_SITE_OTHER): Payer: Managed Care, Other (non HMO) | Admitting: Advanced Practice Midwife

## 2019-02-06 ENCOUNTER — Encounter: Payer: Self-pay | Admitting: Advanced Practice Midwife

## 2019-02-06 ENCOUNTER — Other Ambulatory Visit: Payer: Self-pay

## 2019-02-06 VITALS — BP 132/60 | Wt 331.0 lb

## 2019-02-06 DIAGNOSIS — O34219 Maternal care for unspecified type scar from previous cesarean delivery: Secondary | ICD-10-CM

## 2019-02-06 DIAGNOSIS — O0990 Supervision of high risk pregnancy, unspecified, unspecified trimester: Secondary | ICD-10-CM

## 2019-02-06 DIAGNOSIS — Z3A33 33 weeks gestation of pregnancy: Secondary | ICD-10-CM

## 2019-02-06 DIAGNOSIS — O0993 Supervision of high risk pregnancy, unspecified, third trimester: Secondary | ICD-10-CM

## 2019-02-06 DIAGNOSIS — Z6841 Body Mass Index (BMI) 40.0 and over, adult: Secondary | ICD-10-CM

## 2019-02-06 LAB — POCT URINALYSIS DIPSTICK OB
Glucose, UA: NEGATIVE
POC,PROTEIN,UA: NEGATIVE

## 2019-02-06 NOTE — Progress Notes (Signed)
ROB

## 2019-02-06 NOTE — Progress Notes (Signed)
Routine Prenatal Care Visit  Subjective  Debra Mcdaniel is a 28 y.o. G2P1001 at [redacted]w[redacted]d being seen today for ongoing prenatal care.  She is currently monitored for the following issues for this high-risk pregnancy and has GAD (generalized anxiety disorder); Headache disorder; Rh negative status during pregnancy; Supervision of high-risk pregnancy; BMI 50.0-59.9, adult ([place]); History of cesarean delivery, currently pregnant; Intractable chronic migraine without aura and without status migrainosus; Irritable bowel syndrome with diarrhea; Obesity affecting pregnancy, antepartum; and Labile blood pressure on their problem list.  ----------------------------------------------------------------------------------- Patient reports she is still unsure if she wants to attempt vbac or have repeat c/s. Discussed will need to schedule procedure at next visit..   Contractions: Not present. Vag. Bleeding: None.  Movement: Present. Leaking Fluid denies.  ----------------------------------------------------------------------------------- The following portions of the patient's history were reviewed and updated as appropriate: allergies, current medications, past family history, past medical history, past social history, past surgical history and problem list. Problem list updated.  Objective  Blood pressure 132/60, weight (!) 331 lb (150.1 kg), last menstrual period 06/14/2018. Pregravid weight 305 lb (138.3 kg) Total Weight Gain 26 lb (11.8 kg) Urinalysis: Urine Protein Negative  Urine Glucose Negative  Fetal Status: Fetal Heart Rate (bpm): 152   Movement: Present     General:  Alert, oriented and cooperative. Patient is in no acute distress.  Skin: Skin is warm and dry. No rash noted.   Cardiovascular: Normal heart rate noted  Respiratory: Normal respiratory effort, no problems with respiration noted  Abdomen: Soft, gravid, appropriate for gestational age. Pain/Pressure: Absent     Pelvic:  Cervical exam  deferred        Extremities: Normal range of motion.     Mental Status: Normal mood and affect. Normal behavior. Normal judgment and thought content.   Assessment   28 y.o. G2P1001 at [redacted]w[redacted]d by  03/21/2019, by Last Menstrual Period presenting for routine prenatal visit  Plan   Pregnancy #2 Problems (from 08/25/18 to present)    Problem Noted Resolved   Supervision of high-risk pregnancy 08/29/2018 by Rexene Agent, CNM No   Overview Addendum 01/09/2019  4:54 PM by Rod Can, [place] Prenatal Labs  Dating LMP = 12 week [place] Blood type:     Genetic Screen NIPS: Normal XY Inheritest: negative Antibody:   Anatomic [place]  Rubella:   Varicella:    GTT Early:               Third trimester:  RPR:     Rhogam 01/09/19 HBsAg:     TDaP vaccine                       Flu Shot: HIV:     Baby Food                                GBS:   Contraception  Pap:  CBB     CS/VBAC    Support Person              BMI 50.0-59.9, adult ([place]) 08/29/2018 by Rexene Agent, CNM No   Overview Signed 08/29/2018  1:28 PM by Rexene Agent, CNM    BMI >=40 [ ]  early 1h gtt -  [ ]  u/s for dating [ ]   [ ]  nutritional goals [ ]  folic acid 1mg  [ ]  bASA (>12 weeks) [ ]  consider nutrition consult [ ]   consider maternal EKG 1st trimester [ ]  Growth u/s 28 [ ] , 32 [ ] , 36 weeks [ ]  [ ]  NST/AFI weekly 36+ weeks (36[] , 37[] , 38[] , 39[] , 40[] ) [ ]  IOL by 41 weeks (scheduled, prn [] )          Preterm labor symptoms and general obstetric precautions including but not limited to vaginal bleeding, contractions, leaking of fluid and fetal movement were reviewed in detail with the patient.   Return in about 2 weeks (around 02/20/2019) for growth/nst/rob.  , CNM 02/06/2019 5:45 PM

## 2019-02-20 ENCOUNTER — Encounter: Payer: Managed Care, Other (non HMO) | Admitting: Advanced Practice Midwife

## 2019-02-24 ENCOUNTER — Ambulatory Visit (INDEPENDENT_AMBULATORY_CARE_PROVIDER_SITE_OTHER): Payer: Managed Care, Other (non HMO) | Admitting: Advanced Practice Midwife

## 2019-02-24 ENCOUNTER — Ambulatory Visit (INDEPENDENT_AMBULATORY_CARE_PROVIDER_SITE_OTHER): Payer: Managed Care, Other (non HMO)

## 2019-02-24 ENCOUNTER — Other Ambulatory Visit: Payer: Self-pay | Admitting: Advanced Practice Midwife

## 2019-02-24 ENCOUNTER — Encounter: Payer: Self-pay | Admitting: Advanced Practice Midwife

## 2019-02-24 ENCOUNTER — Encounter
Admission: RE | Admit: 2019-02-24 | Discharge: 2019-02-24 | Disposition: A | Discharge status: Home / Self Care | Payer: Managed Care, Other (non HMO) | Source: Ambulatory Visit | Attending: Anesthesiology | Admitting: Anesthesiology

## 2019-02-24 ENCOUNTER — Other Ambulatory Visit: Payer: Managed Care, Other (non HMO)

## 2019-02-24 ENCOUNTER — Other Ambulatory Visit: Payer: Self-pay

## 2019-02-24 VITALS — BP 138/90 | Wt 333.0 lb

## 2019-02-24 DIAGNOSIS — O0990 Supervision of high risk pregnancy, unspecified, unspecified trimester: Secondary | ICD-10-CM

## 2019-02-24 DIAGNOSIS — Z362 Encounter for other antenatal screening follow-up: Secondary | ICD-10-CM

## 2019-02-24 DIAGNOSIS — O34219 Maternal care for unspecified type scar from previous cesarean delivery: Secondary | ICD-10-CM

## 2019-02-24 DIAGNOSIS — Z3A36 36 weeks gestation of pregnancy: Secondary | ICD-10-CM | POA: Diagnosis not present

## 2019-02-24 DIAGNOSIS — Z113 Encounter for screening for infections with a predominantly sexual mode of transmission: Secondary | ICD-10-CM

## 2019-02-24 DIAGNOSIS — Z3685 Encounter for antenatal screening for Streptococcus B: Secondary | ICD-10-CM

## 2019-02-24 DIAGNOSIS — O99213 Obesity complicating pregnancy, third trimester: Secondary | ICD-10-CM

## 2019-02-24 NOTE — Progress Notes (Signed)
U/s and nst today

## 2019-02-24 NOTE — Consult Note (Signed)
Debra Mcdaniel  Debra Mcdaniel OAC:[id number] DOB: April 25, 1991 DOA: (Not on file) PCP: Tracie Harrier, MD   Requesting physician: Rod Can, CNM Date of Mcdaniel: 02/24/19 Reason for Mcdaniel: Obesity during pregnancy  CHIEF COMPLAINT:  Obesity during pregnancy  HISTORY OF PRESENT ILLNESS: Debra Mcdaniel  is a 28 y.o. female with a known history of obesity during pregnancy. One prior cesarean delivery due to failure to progress. She had epidural for labor analgesia and this was able to be used for her c-section. She hasn't yet decided on whether she wants to proceed with repeat csection or TOLAC. Denies any cardiovascular disease. Denies any hx of asthma. Denies personal or family hx of bleeding disorders.   PAST MEDICAL HISTORY:   Past Medical History:  Diagnosis Date  . Anxiety   . BMI 50.0-59.9, adult ([place])   . Depression   . Headache   . IBS (irritable bowel syndrome)     PAST SURGICAL HISTORY:  Past Surgical History:  Procedure Laterality Date  . CESAREAN SECTION    . COLONOSCOPY WITH PROPOFOL N/A 07/24/2017   Procedure: COLONOSCOPY WITH PROPOFOL;  Surgeon: Toledo, Benay Pike, MD;  Location: [facility] END[facility]COPY;  Service: Gastroenterology;  Laterality: N/A;  . ESOPHAGOGASTRODUODENOSCOPY (EGD) WITH PROPOFOL N/A 07/24/2017   Procedure: ESOPHAGOGASTRODUODENOSCOPY (EGD) WITH PROPOFOL;  Surgeon: Toledo, Benay Pike, MD;  Location: [facility] ENDOSCOPY;  Service: Gastroenterology;  Laterality: N/A;  . MIrena   02/2013    SOCIAL HISTORY:  Social History   Tobacco Use  . Smoking status: Never Smoker  . Smokeless tobacco: Never Used  Substance Use Topics  . Alcohol use: Not Currently    Frequency: Never    FAMILY HISTORY:  Family History  Problem Relation Age of Onset  . Hypertension Mother   . Hypothyroidism Mother   . Obesity Mother   . Thyroid disease Mother   . Hypertension Maternal Grandmother   .  Hypertension Maternal Grandfather   . Hypertension Paternal Grandmother   . Hypertension Paternal Grandfather   . Vision loss Father   . Breast cancer Neg Hx   . Ovarian cancer Neg Hx     DRUG ALLERGIES:  Allergies  Allergen Reactions  . Nickel Rash    REVIEW OF SYSTEMS:   RESPIRATORY: No cough, shortness of breath, wheezing.  CARDIOVASCULAR: No chest pain, orthopnea, edema.  HEMATOLOGY: No anemia, easy bruising or bleeding SKIN: No rash or lesion. NEUROLOGIC: No tingling, numbness, weakness.  PSYCHIATRY: No anxiety or depression.   MEDICATIONS AT HOME:  Prior to Admission medications   Medication Sig Start Date End Date Taking? Authorizing Provider  aspirin (ASPIRIN CHILDRENS) 81 MG chewable tablet Chew 1 tablet (81 mg total) by mouth daily. Patient not taking: Reported on 12/26/2018 12/04/18 12/04/19  Gatha Mayer, MD  Prenatal Vit-Fe Fumarate-FA (MULTIVITAMIN-PRENATAL) 27-0.8 MG TABS tablet Take 1 tablet by mouth daily at 12 noon.    [provider]      PHYSICAL EXAMINATION:   VITAL SIGNS: Last menstrual period 06/14/2018.  GENERAL:  28 y.o.-year-old patient no acute distress.  HEENT: Head atraumatic, normocephalic. Oropharynx and nasopharynx clear. MP 2, TM distance >3 cm, normal mouth opening, grade 1 upper lip bite LUNGS: Normal breath sounds bilaterally, no wheezing, rales,rhonchi. No use of accessory muscles of respiration.  CARDIOVASCULAR: S1, S2 normal. No murmurs, rubs, or gallops.  EXTREMITIES: No pedal edema, cyanosis, or clubbing.  NEUROLOGIC: normal gait PSYCHIATRIC: The patient is alert and oriented x 3.  SKIN: No obvious rash, lesion, or  ulcer.    IMPRESSION AND PLAN:   Debra Mcdaniel  is a 28 y.o. female presenting with obesity during pregnancy. BMI is currently 59 at [redacted] weeks gestation.   Airway exam reassuring today. Significant back adiposity, likely difficult neuraxial.   We discussed analgesic options during labor including  epidural analgesia. Discussed that in obesity there can be increased difficulty with epidural placement or even failure of successful epidural. We also discussed that even after successful epidural placement there is increased risk of catheter migration out of the epidural space that would require catheter replacement. Discussed use of epidural vs spinal vs GA if cesarean delivery is required. Discussed increased risk of difficult intubation during pregnancy should an emergency cesarean delivery be required. Also discussed spinal for planned cesarean delivery.   Plan for delivery at [facility].

## 2019-02-24 NOTE — Progress Notes (Signed)
Routine Prenatal Care Visit  Subjective  Debra Mcdaniel is a 28 y.o. G2P1001 at [redacted]w[redacted]d being seen today for ongoing prenatal care.  She is currently monitored for the following issues for this high-risk pregnancy and has GAD (generalized anxiety disorder); Headache disorder; Rh negative status during pregnancy; Supervision of high-risk pregnancy; BMI 50.0-59.9, adult ([facility]); History of cesarean delivery, currently pregnant; Intractable chronic migraine without aura and without status migrainosus; Irritable bowel syndrome with diarrhea; Obesity affecting pregnancy, antepartum; and Labile blood pressure on their problem list.  ----------------------------------------------------------------------------------- Patient reports "everything hurts". She has decided to have a scheduled c/section rather than vbac. She prefers to have Dr Bonney Aid do the surgery.   Contractions: Not present. Vag. Bleeding: None.  Movement: Present. Leaking Fluid denies.  ----------------------------------------------------------------------------------- The following portions of the patient's history were reviewed and updated as appropriate: allergies, current medications, past family history, past medical history, past social history, past surgical history and problem list. Problem list updated.  Objective  Blood pressure 138/90, weight (!) 333 lb (151 kg), last menstrual period 06/14/2018. Pregravid weight 305 lb (138.3 kg) Total Weight Gain 28 lb (12.7 kg) Urinalysis: Urine Protein    Urine Glucose    Fetal Status: Fetal Heart Rate (bpm): 140   Movement: Present     Growth: 65.2%, 6 pounds 15 ounces, AFI: 12.4 cm, Cephalic  NST reactive 20 minute tracing, 140 bpm baseline, moderate variability, +accelerations, -decelerations  General:  Alert, oriented and cooperative. Patient is in no acute distress.  Skin: Skin is warm and dry. No rash noted.   Cardiovascular: Normal heart rate noted  Respiratory: Normal  respiratory effort, no problems with respiration noted  Abdomen: Soft, gravid, appropriate for gestational age. Pain/Pressure: Absent     Pelvic:  Cervical exam deferred      gbs/aptima collected  Extremities: Normal range of motion.     Mental Status: Normal mood and affect. Normal behavior. Normal judgment and thought content.   Assessment   28 y.o. G2P1001 at [redacted]w[redacted]d by  03/21/2019, by Last Menstrual Period presenting for routine prenatal visit  Plan   Pregnancy #2 Problems (from 08/25/18 to present)    Problem Noted Resolved   Supervision of high-risk pregnancy 08/29/2018 by Oswaldo Conroy, CNM No   Overview Addendum 01/09/2019  4:54 PM by Tresea Mall, CNM    Clinic [facility] Prenatal Labs  Dating LMP = 12 week [place] Blood type:     Genetic Screen NIPS: Normal XY Inheritest: negative Antibody:   Anatomic [place]  Rubella:   Varicella:    GTT Early:               Third trimester:  RPR:     Rhogam 01/09/19 HBsAg:     TDaP vaccine                       Flu Shot: HIV:     Baby Food                                GBS:   Contraception  Pap:  CBB     CS/VBAC    Support Person              BMI 50.0-59.9, adult ([facility]) 08/29/2018 by Oswaldo Conroy, CNM No   Overview Signed 08/29/2018  1:28 PM by Oswaldo Conroy, CNM    BMI >=40 [ ]  early 1h gtt -  [ ]   u/s for dating [ ]   [ ]  nutritional goals [ ]  folic acid 1mg  [ ]  bASA (>12 weeks) [ ]  consider nutrition consult [ ]  consider maternal EKG 1st trimester [ ]  Growth u/s 28 [ ] , 32 [ ] , 36 weeks [ ]  [ ]  NST/AFI weekly 36+ weeks (36[] , 37[] , 38[] , 39[] , 40[] ) [ ]  IOL by 41 weeks (scheduled, prn [] )          Preterm labor symptoms and general obstetric precautions including but not limited to vaginal bleeding, contractions, leaking of fluid and fetal movement were reviewed in detail with the patient.    Return in about 1 week (around 03/03/2019) for afi, nst, rob.  Rod Can, CNM 02/24/2019 5:02 PM

## 2019-02-26 ENCOUNTER — Telehealth: Payer: Self-pay | Admitting: Obstetrics and Gynecology

## 2019-02-26 NOTE — Telephone Encounter (Signed)
Patient's have been notified

## 2019-02-26 NOTE — Telephone Encounter (Signed)
Lmtrc

## 2019-02-26 NOTE — Telephone Encounter (Signed)
-----   Message from Rod Can, CNM sent at 02/24/2019  5:11 PM EDT ----- Surgery Booking Request Patient Full Name:  Debra Mcdaniel  MRN: [id number]  DOB: June 06, 1990  Surgeon: AMS (he agrees to take first case of the day and come in to clinic late) Requested Surgery Date and Time: 03/17/19, 1st case Primary Diagnosis AND Code: repeat c/section Secondary Diagnosis and Code:  Surgical Procedure: Cesarean Section L&D Notification: Yes Admission Status: surgery admit Length of Surgery: 60 minutes Special Case Needs: Yes On Q pump H&P:  Phone Interview???:  Interpreter: No Language:  Medical Clearance: has had anesthesia consult Special Scheduling Instructions:  Any known health/anesthesia issues, diabetes, sleep apnea, latex allergy, defibrillator/pacemaker?: BMI 60 Acuity: P1   (P1 highest, P2 delay may cause harm, P3 low, elective gyn, P4 lowest)

## 2019-02-26 NOTE — Telephone Encounter (Signed)
Patient is aware of H&P on 03/09/19 @ 4:30pm w/ Dr. Georgianne Fick, Pre-admit testing to be scheduled, COVID testing on 03/13/19 @ 9-10am at [facility], and [place] on 03/17/19. Patient is aware she may receive calls from the [place] and [facility]. Patient confirmed Christella Scheuermann and no secondary insurance. Patient's next appointment on Wednesday, 10/28 was confirmed.

## 2019-02-27 LAB — STREP GP B NAA: Strep Gp B NAA: POSITIVE — AB

## 2019-02-27 LAB — CHLAMYDIA/GONOCOCCUS/TRICHOMONAS, NAA
Chlamydia by NAA: NEGATIVE
Gonococcus by NAA: NEGATIVE
Trich vag by NAA: NEGATIVE

## 2019-03-04 ENCOUNTER — Other Ambulatory Visit: Payer: Self-pay

## 2019-03-04 ENCOUNTER — Ambulatory Visit (INDEPENDENT_AMBULATORY_CARE_PROVIDER_SITE_OTHER): Payer: Managed Care, Other (non HMO) | Admitting: Maternal Newborn

## 2019-03-04 ENCOUNTER — Ambulatory Visit (INDEPENDENT_AMBULATORY_CARE_PROVIDER_SITE_OTHER): Payer: Managed Care, Other (non HMO)

## 2019-03-04 VITALS — BP 136/70 | Wt 335.0 lb

## 2019-03-04 DIAGNOSIS — O0990 Supervision of high risk pregnancy, unspecified, unspecified trimester: Secondary | ICD-10-CM

## 2019-03-04 DIAGNOSIS — O26893 Other specified pregnancy related conditions, third trimester: Secondary | ICD-10-CM

## 2019-03-04 DIAGNOSIS — Z6791 Unspecified blood type, Rh negative: Secondary | ICD-10-CM

## 2019-03-04 DIAGNOSIS — Z3689 Encounter for other specified antenatal screening: Secondary | ICD-10-CM | POA: Diagnosis not present

## 2019-03-04 DIAGNOSIS — O34219 Maternal care for unspecified type scar from previous cesarean delivery: Secondary | ICD-10-CM

## 2019-03-04 DIAGNOSIS — Z3A37 37 weeks gestation of pregnancy: Secondary | ICD-10-CM

## 2019-03-04 DIAGNOSIS — M549 Dorsalgia, unspecified: Secondary | ICD-10-CM

## 2019-03-04 DIAGNOSIS — Z0289 Encounter for other administrative examinations: Secondary | ICD-10-CM

## 2019-03-04 LAB — POCT URINALYSIS DIPSTICK OB: Glucose, UA: NEGATIVE

## 2019-03-04 NOTE — Progress Notes (Signed)
Routine Prenatal Care Visit  Subjective  Debra Mcdaniel is a 28 y.o. G2P1001 at [redacted]w[redacted]d being seen today for ongoing prenatal care.  She is currently monitored for the following issues for this high-risk pregnancy and has GAD (generalized anxiety disorder); Headache disorder; Rh negative status during pregnancy; Supervision of high-risk pregnancy; BMI 50.0-59.9, adult ([place]); History of cesarean delivery, currently pregnant; Intractable chronic migraine without aura and without status migrainosus; Irritable bowel syndrome with diarrhea; Obesity affecting pregnancy, antepartum; and Labile blood pressure on their problem list.  ----------------------------------------------------------------------------------- Patient reports wakefulness and difficulty getting good sleep. Having some back pain occasionally.  Contractions: Not present. Vag. Bleeding: None.  Movement: Present. No leaking of fluid.  ----------------------------------------------------------------------------------- The following portions of the patient's history were reviewed and updated as appropriate: allergies, current medications, past family history, past medical history, past social history, past surgical history and problem list. Problem list updated.   Objective  Blood pressure 136/70, weight (!) 335 lb (152 kg), last menstrual period 06/14/2018. Pregravid weight 305 lb (138.3 kg) Total Weight Gain 30 lb (13.6 kg) Urinalysis: Urine dipstick shows negative for glucose, protein. Fetal Status: Fetal Heart Rate (bpm): 140   Movement: Present  Presentation: Vertex  General:  Alert, oriented and cooperative. Patient is in no acute distress.  Skin: Skin is warm and dry. No rash noted.   Cardiovascular: Normal heart rate noted  Respiratory: Normal respiratory effort, no problems with respiration noted  Abdomen: Soft, gravid, appropriate for gestational age. Pain/Pressure: Absent     Pelvic:  Cervical exam deferred         Extremities: Normal range of motion.     Mental Status: Normal mood and affect. Normal behavior. Normal judgment and thought content.   NST Baseline: 140 bpm Variability: moderate Accelerations: present Decelerations: absent Tocometry: note done The patient was monitored for 20 minutes, fetal heart rate tracing was deemed reactive.  Assessment   28 y.o. G2P1001 at [redacted]w[redacted]d EDD 03/21/2019, by Last Menstrual Period presenting for a routine prenatal visit.  Plan   Pregnancy #2 Problems (from 08/25/18 to present)    Problem Noted Resolved   Supervision of high-risk pregnancy 08/29/2018 by Rexene Agent, CNM No   Overview Addendum 01/09/2019  4:54 PM by Rod Can, [place] Prenatal Labs  Dating LMP = 12 week [place] Blood type:     Genetic Screen NIPS: Normal XY Inheritest: negative Antibody:   Anatomic [place]  Rubella:   Varicella:    GTT Early:               Third trimester:  RPR:     Rhogam 01/09/19 HBsAg:     TDaP vaccine                       Flu Shot: HIV:     Baby Food                                GBS:   Contraception  Pap:  CBB     CS/VBAC    Support Person              BMI 50.0-59.9, adult ([place]) 08/29/2018 by Rexene Agent, CNM No   Overview Signed 08/29/2018  1:28 PM by Rexene Agent, CNM    BMI >=40 [ ]  early 1h gtt -  [ ]  u/s for dating [ ]   [ ]   nutritional goals [ ]  folic acid 1mg  [ ]  bASA (>12 weeks) [ ]  consider nutrition consult [ ]  consider maternal EKG 1st trimester [ ]  Growth u/s 28 [ ] , 32 [ ] , 36 weeks [ ]  [ ]  NST/AFI weekly 36+ weeks (36[] , 37[] , 38[] , 39[] , 40[] ) [ ]  IOL by 41 weeks (scheduled, prn [] )       Reactive NST. AFI 8.8 cm, cephalic presentation.  Discussed comfort measures for sleep/back pain.  Term labor symptoms and general obstetric precautions including  were reviewed.  Please refer to After Visit Summary for other counseling recommendations.   Has pre-op appointment 03/09/2019.  , CNM  03/05/2019  12:04 PM

## 2019-03-04 NOTE — Progress Notes (Signed)
C/o Will she still get PCN for GBS if having C/S.rj

## 2019-03-05 ENCOUNTER — Encounter: Payer: Self-pay | Admitting: Maternal Newborn

## 2019-03-05 ENCOUNTER — Telehealth: Payer: Self-pay

## 2019-03-05 NOTE — Patient Instructions (Signed)
Cesarean Delivery Cesarean birth, or cesarean delivery, is the surgical delivery of a baby through an incision in the abdomen and the uterus. This may be referred to as a C-section. This procedure may be scheduled ahead of time, or it may be done in an emergency situation. Tell a health care provider about:  Any allergies you have.  All medicines you are taking, including vitamins, herbs, eye drops, creams, and over-the-counter medicines.  Any problems you or family members have had with anesthetic medicines.  Any blood disorders you have.  Any surgeries you have had.  Any medical conditions you have.  Whether you or any members of your family have a history of deep vein thrombosis (DVT) or pulmonary embolism (PE). What are the risks? Generally, this is a safe procedure. However, problems may occur, including:  Infection.  Bleeding.  Allergic reactions to medicines.  Damage to other structures or organs.  Blood clots.  Injury to your baby. What happens before the procedure? General instructions  Follow instructions from your health care provider about eating or drinking restrictions.  If you know that you are going to have a cesarean delivery, do not shave your pubic area. Shaving before the procedure may increase your risk of infection.  Plan to have someone take you home from the hospital.  Ask your health care provider what steps will be taken to prevent infection. These may include: ? Removing hair at the surgery site. ? Washing skin with a germ-killing soap. ? Taking antibiotic medicine.  Depending on the reason for your cesarean delivery, you may have a physical exam or additional testing, such as an ultrasound.  You may have your blood or urine tested. Questions for your health care provider  Ask your health care provider about: ? Changing or stopping your regular medicines. This is especially important if you are taking diabetes medicines or blood thinners.  ? Your pain management plan. This is especially important if you plan to breastfeed your baby. ? How long you will be in the hospital after the procedure. ? Any concerns you may have about receiving blood products, if you need them during the procedure. ? Cord blood banking, if you plan to collect your baby's umbilical cord blood.  You may also want to ask your health care provider: ? Whether you will be able to hold or breastfeed your baby while you are still in the operating room. ? Whether your baby can stay with you immediately after the procedure and during your recovery. ? Whether a family member or a person of your choice can go with you into the operating room and stay with you during the procedure, immediately after the procedure, and during your recovery. What happens during the procedure?   An IV will be inserted into one of your veins.  Fluid and medicines, such as antibiotics, will be given before the surgery.  Fetal monitors will be placed on your abdomen to check your baby's heart rate.  You may be given a special warming gown to wear to keep your temperature stable.  A catheter may be inserted into your bladder through your urethra. This drains your urine during the procedure.  You may be given one or more of the following: ? A medicine to numb the area (local anesthetic). ? A medicine to make you fall asleep (general anesthetic). ? A medicine (regional anesthetic) that is injected into your back or through a small thin tube placed in your back (spinal anesthetic or epidural anesthetic).   This numbs everything below the injection site and allows you to stay awake during your procedure. If this makes you feel nauseous, tell your health care provider. Medicines will be available to help reduce any nausea you may feel.  An incision will be made in your abdomen, and then in your uterus.  If you are awake during your procedure, you may feel tugging and pulling in your abdomen,  but you should not feel pain. If you feel pain, tell your health care provider immediately.  Your baby will be removed from your uterus. You may feel more pressure or pushing while this happens.  Immediately after birth, your baby will be dried and kept warm. You may be able to hold and breastfeed your baby.  The umbilical cord may be clamped and cut during this time. This usually occurs after waiting a period of 1-2 minutes after delivery.  Your placenta will be removed from your uterus.  Your incisions will be closed with stitches (sutures). Staples, skin glue, or adhesive strips may also be applied to the incision in your abdomen.  Bandages (dressings) may be placed over the incision in your abdomen. The procedure may vary among health care providers and hospitals. What happens after the procedure?  Your blood pressure, heart rate, breathing rate, and blood oxygen level will be monitored until you are discharged from the hospital.  You may continue to receive fluids and medicines through an IV.  You will have some pain. Medicines will be available to help control your pain.  To help prevent blood clots: ? You may be given medicines. ? You may have to wear compression stockings or devices. ? You will be encouraged to walk around when you are able.  Hospital staff will encourage and support bonding with your baby. Your hospital may have you and your baby to stay in the same room (rooming in) during your hospital stay to encourage successful bonding and breastfeeding.  You may be encouraged to cough and breathe deeply often. This helps to prevent lung problems.  If you have a catheter draining your urine, it will be removed as soon as possible after your procedure. Summary  Cesarean birth, or cesarean delivery, is the surgical delivery of a baby through an incision in the abdomen and the uterus.  Follow instructions from your health care provider about eating or drinking  restrictions before the procedure.  You will have some pain after the procedure. Medicines will be available to help control your pain.  Hospital staff will encourage and support bonding with your baby after the procedure. Your hospital may have you and your baby to stay in the same room (rooming in) during your hospital stay to encourage successful bonding and breastfeeding. This information is not intended to replace advice given to you by your health care provider. Make sure you discuss any questions you have with your health care provider. Document Released: 04/23/2005 Document Revised: 10/28/2017 Document Reviewed: 10/28/2017 Elsevier Patient Education  2020 Elsevier Inc.  

## 2019-03-05 NOTE — Telephone Encounter (Signed)
FMLA/DISABILITY form for [employer] filled out, signature obtained and given to KT for processing. 

## 2019-03-09 ENCOUNTER — Encounter: Payer: Self-pay | Admitting: Obstetrics and Gynecology

## 2019-03-09 ENCOUNTER — Ambulatory Visit (INDEPENDENT_AMBULATORY_CARE_PROVIDER_SITE_OTHER): Payer: Managed Care, Other (non HMO) | Admitting: Obstetrics and Gynecology

## 2019-03-09 ENCOUNTER — Other Ambulatory Visit: Payer: Self-pay

## 2019-03-09 VITALS — BP 130/68 | HR 114 | Ht 62.0 in | Wt 334.0 lb

## 2019-03-09 DIAGNOSIS — Z3A38 38 weeks gestation of pregnancy: Secondary | ICD-10-CM

## 2019-03-09 DIAGNOSIS — O99210 Obesity complicating pregnancy, unspecified trimester: Secondary | ICD-10-CM

## 2019-03-09 DIAGNOSIS — O0993 Supervision of high risk pregnancy, unspecified, third trimester: Secondary | ICD-10-CM

## 2019-03-09 DIAGNOSIS — O26893 Other specified pregnancy related conditions, third trimester: Secondary | ICD-10-CM

## 2019-03-09 DIAGNOSIS — Z6841 Body Mass Index (BMI) 40.0 and over, adult: Secondary | ICD-10-CM

## 2019-03-09 DIAGNOSIS — O99213 Obesity complicating pregnancy, third trimester: Secondary | ICD-10-CM

## 2019-03-09 DIAGNOSIS — Z6791 Unspecified blood type, Rh negative: Secondary | ICD-10-CM

## 2019-03-09 DIAGNOSIS — O0990 Supervision of high risk pregnancy, unspecified, unspecified trimester: Secondary | ICD-10-CM

## 2019-03-09 DIAGNOSIS — O34219 Maternal care for unspecified type scar from previous cesarean delivery: Secondary | ICD-10-CM

## 2019-03-09 NOTE — Progress Notes (Signed)
Obstetric H&P   Chief Complaint: Scheduled C-section   Prenatal Care Provider: [facility]  History of Present Illness: 28 y.o. G2P1001 [redacted]w[redacted]d by 03/21/2019, by Last Menstrual Period presenting for scheduled repeat cesarean section.  History of prior cesarean section and desires repeat.  BMI 61, status post anesthesia consult.  Pregnancy has otherwise been uncomplicated.  +FM, no LOF, no VB, no ctx.    Recent growth scan 02/24/2019 36 weeks 3138 g  ( 6 lb 15 oz ) c/w 65.2%ile  Pregravid weight 305 lb (138.3 kg) Total Weight Gain 30 lb (13.6 kg)  Body mass index is 61.09 kg/m.   Pregnancy #2 Problems (from 08/25/18 to present)    Problem Noted Resolved   Supervision of high-risk pregnancy 08/29/2018 by Oswaldo Conroy, CNM No   Overview Addendum 03/09/2019  8:33 PM by [name], MD    Clinic [facility] Prenatal Labs  Dating LMP = 12 week [place] Blood type: O negative  Genetic Screen NIPS: Normal XY Inheritest: negative Antibody:Negative  Anatomic [place]  Rubella:Immune Varicella: Immune  GTT Early:89 Third trimester:137 RPR: NR  Rhogam 01/09/19 HBsAg: negative  TDaP vaccine                       Flu Shot: HIV: negative  Baby Food Bottle                            ASN:[id number]  Contraception Undecided Pap:01/08/2018 NIL  Hgb  AA   CS/VBAC Repeat C-section 11/10   Support Person Husband Berna Spare                Review of Systems: 10 point review of systems negative unless otherwise noted in HPI  Past Medical History: Past Medical History:  Diagnosis Date  . Anxiety   . BMI 50.0-59.9, adult (HCC)   . Depression   . Headache   . IBS (irritable bowel syndrome)     Past Surgical History: Past Surgical History:  Procedure Laterality Date  . CESAREAN SECTION    . COLONOSCOPY WITH PROPOFOL N/A 07/24/2017   Procedure: COLONOSCOPY WITH PROPOFOL;  Surgeon: Toledo, Boykin Nearing, MD;  Location: [facility] ENDOSCOPY;  Service: Gastroenterology;  Laterality: N/A;  . ESOPHAGOGASTRODUODENOSCOPY (EGD)  WITH PROPOFOL N/A 07/24/2017   Procedure: ESOPHAGOGASTRODUODENOSCOPY (EGD) WITH PROPOFOL;  Surgeon: Toledo, Boykin Nearing, MD;  Location: [facility] ENDOSCOPY;  Service: Gastroenterology;  Laterality: N/A;  . MIrena   02/2013    Past Obstetric History: # 1 - Date: 12/22/12, Sex: Female, Weight: 8 lb 10 oz (3.912 kg), GA: None, Delivery: C-Section, Low Transverse, Apgar1: None, Apgar5: None, Living: Living, Birth Comments: None  # 2 - Date: None, Sex: None, Weight: None, GA: None, Delivery: None, Apgar1: None, Apgar5: None, Living: None, Birth Comments: None   Past Gynecologic History:  Family History: Family History  Problem Relation Age of Onset  . Hypertension Mother   . Hypothyroidism Mother   . Obesity Mother   . Thyroid disease Mother   . Hypertension Maternal Grandmother   . Hypertension Maternal Grandfather   . Hypertension Paternal Grandmother   . Hypertension Paternal Grandfather   . Vision loss Father   . Breast cancer Neg Hx   . Ovarian cancer Neg Hx     Social History: Social History   Socioeconomic History  . Marital status: Married    Spouse name: Not on file  . Number of children: Not on file  . Years  of education: Not on file  . Highest education level: Not on file  Occupational History  . Occupation: [employer]  Social Needs  . Financial resource strain: Not on file  . Food insecurity    Worry: Not on file    Inability: Not on file  . Transportation needs    Medical: Not on file    Non-medical: Not on file  Tobacco Use  . Smoking status: Never Smoker  . Smokeless tobacco: Never Used  Substance and Sexual Activity  . Alcohol use: Not Currently    Frequency: Never  . Drug use: No  . Sexual activity: Yes    Birth control/protection: None  Lifestyle  . Physical activity    Days per week: Not on file    Minutes per session: Not on file  . Stress: Not on file  Relationships  . Social Musicianconnections    Talks on phone: Not on file    Gets together: Not on file     Attends religious service: Not on file    Active member of club or organization: Not on file    Attends meetings of clubs or organizations: Not on file    Relationship status: Not on file  . Intimate partner violence    Fear of current or ex partner: Not on file    Emotionally abused: Not on file    Physically abused: Not on file    Forced sexual activity: Not on file  Other Topics Concern  . Not on file  Social History Narrative   ** Merged History Encounter **        Medications: Prior to Admission medications   Medication Sig Start Date End Date Taking? Authorizing Provider  acetaminophen (TYLENOL) 325 MG tablet Take 325 mg by mouth every 6 (six) hours as needed for headache.    [provider]  Prenatal Vit-Fe Fumarate-FA (MULTIVITAMIN-PRENATAL) 27-0.8 MG TABS tablet Take 1 tablet by mouth daily at 12 noon.    [provider]    Allergies: Allergies  Allergen Reactions  . Nickel Rash    Physical Exam: Vitals: Blood pressure 130/68, pulse (!) 114, height 5\' 2"  (1.575 m), weight (!) 334 lb (151.5 kg), last menstrual period 06/14/2018. Body mass index is 61.09 kg/m.  FHT: 145  General: NAD HEENT: normocephalic, anicteric Pulmonary: No increased work of breathing Cardiovascular: RRR, distal pulses 2+ Abdomen: Gravid, non-tenderLeopolds: Genitourinary: deferred Extremities: no edema, erythema, or tenderness Neurologic: Grossly intact Psychiatric: mood appropriate, affect full  Labs: No results found for this or any previous visit (from the past 24 hour(s)).  Assessment: 28 y.o. G2P1001 716w2d by 03/21/2019, by Last Menstrual Period presenting to schedule C-section   Plan: 1) The patient was counseled regarding risk and benefits to proceeding with Cesarean section to expedite delivery.  Risk of cesarean section were discussed including risk of bleeding and need for potential intraoperative or postoperative blood transfusion with a rate of  approximately 5% quoted for all Cesarean sections, risk of injury to adjacent organs including but not limited to bowl and bladder, the need for additional surgical procedures to address such injuries, and the risk of infection.  We discussed that given BMI>40 we would utilized Lovenox prophylaxis postpartum.  I also discussed Provena placement at the time of C-section.  2) Fetus - +FTH  3) PNL - Blood type O/Negative/-- (04/29 1452) / Anti-bodyscreen Negative (08/21 1538) / Rubella 2.36 (04/29 1452) / Varicella Immune / RPR Non Reactive (08/21 1538) / HBsAg Negative (  04/29 1452) / HIV Non Reactive (08/21 1538) / 1-hr OGTT 137 / GBS --/Positive (10/21 1637)  4) Immunization History -   There is no immunization history on file for this patient.  5) Disposition - pending delivery  [name], MD, [facility] 03/09/2019, 4:42 PM

## 2019-03-11 ENCOUNTER — Other Ambulatory Visit: Payer: Self-pay

## 2019-03-11 ENCOUNTER — Encounter
Admission: RE | Admit: 2019-03-11 | Discharge: 2019-03-11 | Disposition: A | Discharge status: Home / Self Care | Payer: Managed Care, Other (non HMO) | Source: Ambulatory Visit | Attending: Family Medicine | Admitting: Family Medicine

## 2019-03-11 DIAGNOSIS — Z01812 Encounter for preprocedural laboratory examination: Secondary | ICD-10-CM | POA: Insufficient documentation

## 2019-03-11 NOTE — Patient Instructions (Signed)
Your procedure is scheduled on: March 17, 2019 Tuesday COME TO EMERGENCY DEPARTMENT AT 5:30 AM   REMEMBER: Instructions that are not followed completely may result in serious medical risk, up to and including death; or upon the discretion of your surgeon and anesthesiologist your surgery may need to be rescheduled.  Do not eat food after midnight the night before surgery.  No gum chewing, lozengers or hard candies.  You may however, drink CLEAR liquids up to 2 hours before you are scheduled to arrive for your surgery. Do not drink anything within 2 hours of the start of your surgery.  Clear liquids include: - water   No Alcohol for 24 hours before or after surgery.  No Smoking including e-cigarettes for 24 hours prior to surgery.  No chewable tobacco products for at least 6 hours prior to surgery.  No nicotine patches on the day of surgery.  On the morning of surgery brush your teeth with toothpaste and water, you may rinse your mouth with mouthwash if you wish. Do not swallow any toothpaste or mouthwash.  Notify your doctor if there is any change in your medical condition (cold, fever, infection).  Do not wear jewelry, make-up, hairpins, clips or nail polish.  Do not wear lotions, powders, or perfumes.   Do not shave 48 hours prior to surgery.   Contacts and dentures may not be worn into surgery.  Do not bring valuables to the hospital, including drivers license, insurance or credit cards.  [facility] is not responsible for any belongings or valuables.   TAKE THESE MEDICATIONS THE MORNING OF SURGERY: NONE   Use CHG wipes as directed on instruction sheet.  Stop Anti-inflammatories (NSAIDS) such as Advil, Aleve, Ibuprofen, Motrin, Naproxen, Naprosyn and Aspirin based products such as Excedrin, Goodys Powder, BC Powder. (May take Tylenol or Acetaminophen if needed.)  Stop ANY OVER THE COUNTER supplements until after surgery. (May continue Vitamin D, Vitamin B, and  multivitamin.OR PRENATAL)  Wear comfortable clothing (specific to your surgery type) to the hospital.  Plan for stool softeners for home use.  If you are being admitted to the hospital overnight, leave your suitcase in the car. After surgery it may be brought to your room.  If you are being discharged the day of surgery, you will not be allowed to drive home. You will need a responsible adult to drive you home and stay with you that night.   If you are taking public transportation, you will need to have a responsible adult with you. Please confirm with your physician that it is acceptable to use public transportation.   Please call [phone] if you have any questions about these instructions.

## 2019-03-13 ENCOUNTER — Other Ambulatory Visit
Admission: RE | Admit: 2019-03-13 | Discharge: 2019-03-13 | Disposition: A | Discharge status: Home / Self Care | Payer: Managed Care, Other (non HMO) | Source: Ambulatory Visit | Attending: Obstetrics and Gynecology | Admitting: Obstetrics and Gynecology

## 2019-03-13 ENCOUNTER — Telehealth: Payer: Self-pay

## 2019-03-13 ENCOUNTER — Other Ambulatory Visit: Payer: Self-pay

## 2019-03-13 LAB — SARS CORONAVIRUS 2 (TAT 6-24 HRS): SARS Coronavirus 2: NEGATIVE

## 2019-03-13 NOTE — Telephone Encounter (Signed)
Pt called nurse line last night because she was having fluid leakage. States she is fine, no leakage/spotting/bleeding. Is starting to notice contractions every 30ish minutes now. Will let us know if she has any concerns.

## 2019-03-14 ENCOUNTER — Encounter: Payer: Self-pay | Admitting: *Deleted

## 2019-03-14 ENCOUNTER — Inpatient Hospital Stay
Admission: EM | Admit: 2019-03-14 | Discharge: 2019-03-18 | DRG: 787 | Disposition: A | Discharge status: Home / Self Care | Payer: Managed Care, Other (non HMO) | Attending: Obstetrics & Gynecology | Admitting: Obstetrics & Gynecology

## 2019-03-14 DIAGNOSIS — Z6791 Unspecified blood type, Rh negative: Secondary | ICD-10-CM

## 2019-03-14 DIAGNOSIS — O34211 Maternal care for low transverse scar from previous cesarean delivery: Principal | ICD-10-CM | POA: Diagnosis present

## 2019-03-14 DIAGNOSIS — O99210 Obesity complicating pregnancy, unspecified trimester: Secondary | ICD-10-CM | POA: Diagnosis present

## 2019-03-14 DIAGNOSIS — O99214 Obesity complicating childbirth: Secondary | ICD-10-CM | POA: Diagnosis present

## 2019-03-14 DIAGNOSIS — D62 Acute posthemorrhagic anemia: Secondary | ICD-10-CM | POA: Diagnosis present

## 2019-03-14 DIAGNOSIS — O9081 Anemia of the puerperium: Secondary | ICD-10-CM | POA: Diagnosis present

## 2019-03-14 DIAGNOSIS — Z20828 Contact with and (suspected) exposure to other viral communicable diseases: Secondary | ICD-10-CM | POA: Diagnosis present

## 2019-03-14 DIAGNOSIS — Z3A39 39 weeks gestation of pregnancy: Secondary | ICD-10-CM

## 2019-03-14 DIAGNOSIS — O34219 Maternal care for unspecified type scar from previous cesarean delivery: Secondary | ICD-10-CM | POA: Diagnosis present

## 2019-03-15 ENCOUNTER — Inpatient Hospital Stay: Payer: Managed Care, Other (non HMO) | Admitting: Anesthesiology

## 2019-03-15 ENCOUNTER — Other Ambulatory Visit: Payer: Self-pay

## 2019-03-15 ENCOUNTER — Encounter
Admission: EM | Disposition: A | Discharge status: Home / Self Care | Payer: Self-pay | Source: Home / Self Care | Attending: Obstetrics & Gynecology

## 2019-03-15 DIAGNOSIS — O34211 Maternal care for low transverse scar from previous cesarean delivery: Secondary | ICD-10-CM | POA: Diagnosis present

## 2019-03-15 DIAGNOSIS — O34219 Maternal care for unspecified type scar from previous cesarean delivery: Secondary | ICD-10-CM | POA: Diagnosis not present

## 2019-03-15 DIAGNOSIS — O9081 Anemia of the puerperium: Secondary | ICD-10-CM | POA: Diagnosis present

## 2019-03-15 DIAGNOSIS — O99214 Obesity complicating childbirth: Secondary | ICD-10-CM | POA: Diagnosis present

## 2019-03-15 DIAGNOSIS — O99824 Streptococcus B carrier state complicating childbirth: Secondary | ICD-10-CM | POA: Diagnosis not present

## 2019-03-15 DIAGNOSIS — Z20828 Contact with and (suspected) exposure to other viral communicable diseases: Secondary | ICD-10-CM | POA: Diagnosis present

## 2019-03-15 DIAGNOSIS — O26893 Other specified pregnancy related conditions, third trimester: Secondary | ICD-10-CM | POA: Diagnosis present

## 2019-03-15 DIAGNOSIS — Z3A39 39 weeks gestation of pregnancy: Secondary | ICD-10-CM | POA: Diagnosis not present

## 2019-03-15 DIAGNOSIS — D62 Acute posthemorrhagic anemia: Secondary | ICD-10-CM | POA: Diagnosis present

## 2019-03-15 LAB — CBC
HCT: 37.1 % (ref 36.0–46.0)
Hemoglobin: 11.6 g/dL — ABNORMAL LOW (ref 12.0–15.0)
MCH: 28.9 pg (ref 26.0–34.0)
MCHC: 31.3 g/dL (ref 30.0–36.0)
MCV: 92.5 fL (ref 80.0–100.0)
Platelets: 347 10*3/uL (ref 150–400)
RBC: 4.01 MIL/uL (ref 3.87–5.11)
RDW: 14.4 % (ref 11.5–15.5)
WBC: 10.9 10*3/uL — ABNORMAL HIGH (ref 4.0–10.5)
nRBC: 0 % (ref 0.0–0.2)

## 2019-03-15 LAB — CREATININE, SERUM
Creatinine, Ser: 0.69 mg/dL (ref 0.44–1.00)
GFR calc Af Amer: >60 mL/min (ref >60)
GFR calc non Af Amer: >60 mL/min (ref >60)

## 2019-03-15 LAB — TYPE AND SCREEN
ABO/RH(D): O NEG
Antibody Screen: NEGATIVE

## 2019-03-15 LAB — FETAL SCREEN: Fetal Screen: NEGATIVE

## 2019-03-15 SURGERY — Surgical Case
Anesthesia: Spinal

## 2019-03-15 MED ORDER — LIDOCAINE HCL (PF) 2 % IJ SOLN
INTRAMUSCULAR | Status: AC
  Filled 2019-03-15: qty 40

## 2019-03-15 MED ORDER — COCONUT OIL OIL
1.0000 "application " | TOPICAL_OIL | Status: DC | PRN
  Filled 2019-03-15 (×2): qty 120

## 2019-03-15 MED ORDER — IBUPROFEN 800 MG PO TABS
800.0000 mg | ORAL_TABLET | Freq: Four times a day (QID) | ORAL | Status: DC
  Administered 2019-03-16 (×3): 800 mg via ORAL
  Filled 2019-03-15 (×3): qty 1

## 2019-03-15 MED ORDER — LACTATED RINGERS IV SOLN: INTRAVENOUS | Status: DC

## 2019-03-15 MED ORDER — BUPIVACAINE 0.25 % ON-Q PUMP DUAL CATH 400 ML
400.0000 mL | INJECTION | Status: DC
  Filled 2019-03-15: qty 400

## 2019-03-15 MED ORDER — DEXTROSE 5 % IV SOLN
3.0000 g | INTRAVENOUS | Status: AC
  Administered 2019-03-15: 3 g via INTRAVENOUS
  Filled 2019-03-15: qty 3000

## 2019-03-15 MED ORDER — OXYTOCIN 40 UNITS IN NORMAL SALINE INFUSION - SIMPLE MED
INTRAVENOUS | Status: AC
  Administered 2019-03-15: 2.5 [IU]/h via INTRAVENOUS
  Filled 2019-03-15: qty 1000

## 2019-03-15 MED ORDER — ONDANSETRON HCL 4 MG/2ML IJ SOLN
INTRAMUSCULAR | Status: AC
  Filled 2019-03-15: qty 6

## 2019-03-15 MED ORDER — SOD CITRATE-CITRIC ACID 500-334 MG/5ML PO SOLN: 30.0000 mL | ORAL | Status: DC

## 2019-03-15 MED ORDER — SENNOSIDES-DOCUSATE SODIUM 8.6-50 MG PO TABS
2.0000 | ORAL_TABLET | ORAL | Status: DC
  Administered 2019-03-16 – 2019-03-17 (×2): 2 via ORAL
  Filled 2019-03-15 (×3): qty 2

## 2019-03-15 MED ORDER — MISOPROSTOL 200 MCG PO TABS
ORAL_TABLET | ORAL | Status: AC
  Filled 2019-03-15: qty 4

## 2019-03-15 MED ORDER — BUPIVACAINE HCL (PF) 0.5 % IJ SOLN
INTRAMUSCULAR | Status: AC
  Filled 2019-03-15: qty 30

## 2019-03-15 MED ORDER — SUCCINYLCHOLINE CHLORIDE 20 MG/ML IJ SOLN
INTRAMUSCULAR | Status: AC
  Filled 2019-03-15: qty 1

## 2019-03-15 MED ORDER — OXYTOCIN 40 UNITS IN NORMAL SALINE INFUSION - SIMPLE MED
2.5000 [IU]/h | INTRAVENOUS | Status: AC
  Administered 2019-03-15 (×2): 2.5 [IU]/h via INTRAVENOUS

## 2019-03-15 MED ORDER — MORPHINE SULFATE (PF) 2 MG/ML IV SOLN: 1.0000 mg | INTRAVENOUS | Status: DC | PRN

## 2019-03-15 MED ORDER — PHENYLEPHRINE HCL (PRESSORS) 10 MG/ML IV SOLN
INTRAVENOUS | Status: DC | PRN
  Administered 2019-03-15: 100 ug via INTRAVENOUS
  Administered 2019-03-15: 150 ug via INTRAVENOUS
  Administered 2019-03-15 (×3): 100 ug via INTRAVENOUS
  Administered 2019-03-15: 200 ug via INTRAVENOUS
  Administered 2019-03-15: 100 ug via INTRAVENOUS

## 2019-03-15 MED ORDER — PHENYLEPHRINE HCL (PRESSORS) 10 MG/ML IV SOLN
INTRAVENOUS | Status: AC
  Filled 2019-03-15: qty 1

## 2019-03-15 MED ORDER — METHYLERGONOVINE MALEATE 0.2 MG/ML IJ SOLN
INTRAMUSCULAR | Status: AC
  Filled 2019-03-15: qty 1

## 2019-03-15 MED ORDER — DIBUCAINE (PERIANAL) 1 % EX OINT: 1.0000 "application " | TOPICAL_OINTMENT | CUTANEOUS | Status: DC | PRN

## 2019-03-15 MED ORDER — FENTANYL CITRATE (PF) 100 MCG/2ML IJ SOLN
INTRAMUSCULAR | Status: DC | PRN
  Administered 2019-03-15: 15 ug via INTRATHECAL

## 2019-03-15 MED ORDER — BUPIVACAINE IN DEXTROSE 0.75-8.25 % IT SOLN
INTRATHECAL | Status: DC | PRN
  Administered 2019-03-15: 1.55 mL via INTRATHECAL

## 2019-03-15 MED ORDER — PROMETHAZINE HCL 25 MG/ML IJ SOLN
6.2500 mg | Freq: Once | INTRAMUSCULAR | Status: DC
  Administered 2019-03-15: 6.25 mg via INTRAVENOUS

## 2019-03-15 MED ORDER — OXYTOCIN 40 UNITS IN NORMAL SALINE INFUSION - SIMPLE MED
INTRAVENOUS | Status: DC | PRN
  Administered 2019-03-15: 1000 mL via INTRAVENOUS

## 2019-03-15 MED ORDER — ONDANSETRON HCL 4 MG/2ML IJ SOLN
INTRAMUSCULAR | Status: DC | PRN
  Administered 2019-03-15: 4 mg via INTRAVENOUS

## 2019-03-15 MED ORDER — ROCURONIUM BROMIDE 50 MG/5ML IV SOLN
INTRAVENOUS | Status: AC
  Filled 2019-03-15: qty 1

## 2019-03-15 MED ORDER — TERBUTALINE SULFATE 1 MG/ML IJ SOLN
INTRAMUSCULAR | Status: AC
  Filled 2019-03-15: qty 1

## 2019-03-15 MED ORDER — PRENATAL MULTIVITAMIN CH
1.0000 | ORAL_TABLET | Freq: Every day | ORAL | Status: DC
  Administered 2019-03-15 – 2019-03-18 (×4): 1 via ORAL
  Filled 2019-03-15 (×3): qty 1

## 2019-03-15 MED ORDER — MORPHINE SULFATE (PF) 0.5 MG/ML IJ SOLN
INTRAMUSCULAR | Status: DC | PRN
  Administered 2019-03-15: .1 mg via EPIDURAL

## 2019-03-15 MED ORDER — SIMETHICONE 80 MG PO CHEW: 80.0000 mg | CHEWABLE_TABLET | ORAL | Status: DC

## 2019-03-15 MED ORDER — PROMETHAZINE HCL 25 MG/ML IJ SOLN
INTRAMUSCULAR | Status: AC
  Filled 2019-03-15: qty 1

## 2019-03-15 MED ORDER — DIPHENHYDRAMINE HCL 25 MG PO CAPS
25.0000 mg | ORAL_CAPSULE | Freq: Four times a day (QID) | ORAL | Status: DC | PRN
  Administered 2019-03-15 – 2019-03-18 (×4): 25 mg via ORAL
  Filled 2019-03-15 (×4): qty 1

## 2019-03-15 MED ORDER — MENTHOL 3 MG MT LOZG
1.0000 | LOZENGE | OROMUCOSAL | Status: DC | PRN
  Filled 2019-03-15: qty 9

## 2019-03-15 MED ORDER — TERBUTALINE SULFATE 1 MG/ML IJ SOLN
0.2500 mg | Freq: Once | INTRAMUSCULAR | Status: AC
  Administered 2019-03-15: 0.25 mg via SUBCUTANEOUS

## 2019-03-15 MED ORDER — DEXAMETHASONE SODIUM PHOSPHATE 10 MG/ML IJ SOLN
INTRAMUSCULAR | Status: AC
  Filled 2019-03-15: qty 2

## 2019-03-15 MED ORDER — KETOROLAC TROMETHAMINE 30 MG/ML IJ SOLN
INTRAMUSCULAR | Status: AC
  Filled 2019-03-15: qty 2

## 2019-03-15 MED ORDER — FENTANYL CITRATE (PF) 100 MCG/2ML IJ SOLN
INTRAMUSCULAR | Status: AC
  Filled 2019-03-15: qty 2

## 2019-03-15 MED ORDER — BUPIVACAINE HCL 0.5 % IJ SOLN
20.0000 mL | INTRAMUSCULAR | Status: DC
  Filled 2019-03-15: qty 20

## 2019-03-15 MED ORDER — LACTATED RINGERS IV SOLN
INTRAVENOUS | Status: DC
  Administered 2019-03-15: 01:00:00 via INTRAVENOUS

## 2019-03-15 MED ORDER — CARBOPROST TROMETHAMINE 250 MCG/ML IM SOLN
INTRAMUSCULAR | Status: AC
  Filled 2019-03-15: qty 1

## 2019-03-15 MED ORDER — EPHEDRINE SULFATE 50 MG/ML IJ SOLN
INTRAMUSCULAR | Status: AC
  Filled 2019-03-15: qty 2

## 2019-03-15 MED ORDER — ENOXAPARIN SODIUM 40 MG/0.4ML ~~LOC~~ SOLN
40.0000 mg | SUBCUTANEOUS | Status: DC
  Administered 2019-03-16 – 2019-03-18 (×3): 40 mg via SUBCUTANEOUS
  Filled 2019-03-15 (×3): qty 0.4

## 2019-03-15 MED ORDER — SIMETHICONE 80 MG PO CHEW
80.0000 mg | CHEWABLE_TABLET | ORAL | Status: DC | PRN
  Administered 2019-03-16: 80 mg via ORAL
  Filled 2019-03-15: qty 1

## 2019-03-15 MED ORDER — WITCH HAZEL-GLYCERIN EX PADS: 1.0000 "application " | MEDICATED_PAD | CUTANEOUS | Status: DC | PRN

## 2019-03-15 MED ORDER — OXYTOCIN 40 UNITS IN NORMAL SALINE INFUSION - SIMPLE MED
INTRAVENOUS | Status: AC
  Filled 2019-03-15: qty 1000

## 2019-03-15 MED ORDER — KETOROLAC TROMETHAMINE 30 MG/ML IJ SOLN
30.0000 mg | Freq: Four times a day (QID) | INTRAMUSCULAR | Status: AC
  Administered 2019-03-15 – 2019-03-16 (×3): 30 mg via INTRAVENOUS
  Filled 2019-03-15 (×3): qty 1

## 2019-03-15 MED ORDER — SIMETHICONE 80 MG PO CHEW
80.0000 mg | CHEWABLE_TABLET | Freq: Three times a day (TID) | ORAL | Status: DC
  Administered 2019-03-15 – 2019-03-18 (×10): 80 mg via ORAL
  Filled 2019-03-15 (×10): qty 1

## 2019-03-15 MED ORDER — MORPHINE SULFATE (PF) 0.5 MG/ML IJ SOLN
INTRAMUSCULAR | Status: AC
  Filled 2019-03-15: qty 10

## 2019-03-15 MED ORDER — OXYCODONE-ACETAMINOPHEN 5-325 MG PO TABS
1.0000 | ORAL_TABLET | ORAL | Status: DC | PRN
  Administered 2019-03-15 – 2019-03-17 (×12): 1 via ORAL
  Filled 2019-03-15 (×8): qty 1
  Filled 2019-03-15: qty 2
  Filled 2019-03-15 (×5): qty 1

## 2019-03-15 MED ORDER — SODIUM CHLORIDE 0.9 % IV SOLN
500.0000 mg | INTRAVENOUS | Status: AC
  Administered 2019-03-15: 500 mg via INTRAVENOUS
  Filled 2019-03-15: qty 500

## 2019-03-15 MED ORDER — SODIUM CHLORIDE 0.9 % IV SOLN
INTRAVENOUS | Status: DC | PRN
  Administered 2019-03-15: 02:00:00 50 ug/min via INTRAVENOUS

## 2019-03-15 MED ORDER — ZOLPIDEM TARTRATE 5 MG PO TABS: 5.0000 mg | ORAL_TABLET | Freq: Every evening | ORAL | Status: DC | PRN

## 2019-03-15 MED ORDER — KETOROLAC TROMETHAMINE 30 MG/ML IJ SOLN
INTRAMUSCULAR | Status: DC | PRN
  Administered 2019-03-15: 30 mg via INTRAVENOUS

## 2019-03-15 SURGICAL SUPPLY — 29 items
CANISTER SUCT 3000ML PPV (MISCELLANEOUS) ×3 IMPLANT
CATH KIT ON-Q SILVERSOAK 5IN (CATHETERS) ×6 IMPLANT
CHLORAPREP W/TINT 26 (MISCELLANEOUS) ×6 IMPLANT
COVER WAND RF STERILE (DRAPES) ×3 IMPLANT
DERMABOND ADVANCED (GAUZE/BANDAGES/DRESSINGS) ×2
DERMABOND ADVANCED .7 DNX12 (GAUZE/BANDAGES/DRESSINGS) ×1 IMPLANT
ELECT CAUTERY BLADE 6.4 (BLADE) ×3 IMPLANT
ELECT REM PT RETURN 9FT ADLT (ELECTROSURGICAL) ×3
ELECTRODE REM PT RTRN 9FT ADLT (ELECTROSURGICAL) ×1 IMPLANT
GLOVE SKINSENSE NS SZ8.0 LF (GLOVE) ×2
GLOVE SKINSENSE STRL SZ8.0 LF (GLOVE) ×1 IMPLANT
GOWN STRL REUS W/ TWL LRG LVL3 (GOWN DISPOSABLE) ×1 IMPLANT
GOWN STRL REUS W/ TWL XL LVL3 (GOWN DISPOSABLE) ×2 IMPLANT
GOWN STRL REUS W/TWL LRG LVL3 (GOWN DISPOSABLE) ×2
GOWN STRL REUS W/TWL XL LVL3 (GOWN DISPOSABLE) ×4
KIT PREVENA INCISION MGT 13 (CANNISTER) ×3 IMPLANT
NS IRRIG 1000ML POUR BTL (IV SOLUTION) ×3 IMPLANT
PACK C SECTION AR (MISCELLANEOUS) ×3 IMPLANT
PAD OB MATERNITY 4.3X12.25 (PERSONAL CARE ITEMS) ×3 IMPLANT
PAD PREP 24X41 OB/GYN DISP (PERSONAL CARE ITEMS) ×3 IMPLANT
PENCIL SMOKE ULTRAEVAC 22 CON (MISCELLANEOUS) ×3 IMPLANT
RETRACTOR TRAXI PANNICULUS (MISCELLANEOUS) ×1 IMPLANT
STAPLER INSORB 30 2030 C-SECTI (MISCELLANEOUS) ×3 IMPLANT
SUT MAXON ABS #0 GS21 30IN (SUTURE) ×6 IMPLANT
SUT PLAIN 2 0 XLH (SUTURE) ×3 IMPLANT
SUT VIC AB 1 CT1 36 (SUTURE) ×9 IMPLANT
SUT VIC AB 2-0 CT1 36 (SUTURE) ×3 IMPLANT
SUT VIC AB 4-0 FS2 27 (SUTURE) ×3 IMPLANT
TRAXI PANNICULUS RETRACTOR (MISCELLANEOUS) ×2

## 2019-03-15 NOTE — Transfer of Care (Signed)
Immediate Anesthesia Transfer of Care Note  Patient: Debra Mcdaniel  Procedure(s) Performed: CESAREAN SECTION (N/A )  Patient Location: PACU and Mother/Baby  Anesthesia Type:Spinal  Level of Consciousness: awake, alert  and oriented  Airway & Oxygen Therapy: Patient Spontanous Breathing  Post-op Assessment: Report given to RN and Post -op Vital signs reviewed and stable  Post vital signs: Reviewed and stable  Last Vitals:  Vitals Value Taken Time  BP 128/58 03/15/19 0332  Temp    Pulse 121 03/15/19 0332  Resp 19 03/15/19 0332  SpO2 100 % 03/15/19 0332    Last Pain:  Vitals:   03/15/19 0332  TempSrc:   PainSc: 0-No pain         Complications: No apparent anesthesia complications

## 2019-03-15 NOTE — Op Note (Signed)
Cesarean Section Procedure Note Indications: prior cesarean section and term intrauterine pregnancy  Pre-operative Diagnosis: Intrauterine pregnancy [redacted]w[redacted]d ;  prior cesarean section and term intrauterine pregnancy Post-operative Diagnosis: same, delivered. Procedure: Low Transverse Cesarean Section Surgeon: Barnett Applebaum, MD, FACOG Assistant(s): Dalia Heading, CNM, No other capable assistant available, in surgery requiring high level assistant. Anesthesia: Spinal anesthesia Estimated Blood ZHYQ:657 mL Complications: None; patient tolerated the procedure well. Disposition: PACU - hemodynamically stable. Condition: stable  Findings: A female infant in the cephalic presentation.   "Maddox" Amniotic fluid - Clear  Birth weight 7-15lbs.  Apgars of 8 and 9.  Intact placenta with a three-vessel cord. Grossly normal uterus, tubes and ovaries bilaterally. Moderate amount of intraabdominal adhesions were noted. Surgery complexity due to obesity. Prevena utilized  Procedure Details   The patient was taken to Operating Room, identified as the correct patient and the procedure verified as C-Section Delivery. A Time Out was held and the above information confirmed. After induction of anesthesia, the patient was draped and prepped in the usual sterile manner. A Pfannenstiel incision was made and carried down through the subcutaneous tissue to the fascia. Fascial incision was made and extended transversely with the Mayo scissors. The fascia was separated from the underlying rectus tissue superiorly and inferiorly. The peritoneum was identified and entered bluntly. Peritoneal incision was extended longitudinally. The utero-vesical peritoneal reflection was incised transversely and a bladder flap was created digitally.  A low transverse hysterotomy was made. The fetus was delivered atraumatically. The umbilical cord was clamped x2 and cut and the infant was handed to the awaiting pediatricians. The placenta  was removed intact and appeared normal with a 3-vessel cord.  The uterus was exteriorized and cleared of all clot and debris. The hysterotomy was closed with running sutures of 0 Vicryl suture. A second imbricating layer was placed with the same suture. Excellent hemostasis was observed. The uterus was returned to the abdomen. The pelvis was irrigated and again, excellent hemostasis was noted.  The rectus fascia was then reapproximated with running sutures of Maxon. Subcutaneous tissues are then irrigated with saline and hemostasis assured.  Skin is then closed with staples. A provena bandage is applied over the skin staple closure. Instrument, sponge, and needle counts were correct prior to the abdominal closure and at the conclusion of the case.  The patient tolerated the procedure well and was transferred to the recovery room in stable condition.   Barnett Applebaum, MD, [facility], [facility] 03/15/2019  3:32 AM

## 2019-03-15 NOTE — Anesthesia Post-op Follow-up Note (Signed)
Anesthesia QCDR form completed.        

## 2019-03-15 NOTE — Lactation Note (Addendum)
This note was copied from a baby's chart. Lactation Consultation Note  Patient Name: Debra Mcdaniel [id number] Date: 03/15/2019   Mom has been asking for pump to be set up in room, but has been willing to breast feed for now.  Demonstrated how to easily hand expression colostrum.  Mom has very large breasts and is having considerable pain from C/S.  She struggles to latch [employer] on her own.  We have to put a blanket roll under mom's breast for support.  Assisted mom with positioning [name] with pillow support in football hold on left breast.  He latches with minimal assistance and begins strong rhythmic sucking with audible swallows.  Mom denies any breast or nipple pain.  Set up Symphony in room.  Mom wanting to pump right breast while [name] was nursing on left breast.  Mom pumped 3 to 4 ml.  Instructed in hand expression, massaging of breast, pumping, collection, storage, labeling, cleaning and handling of expressed colostrum.  Mom tried to breast feed first Debra, who is now 27 years old, without success.  Mom has a Spectra pump that she got through her [employer].  Explained feeding cues and encouraged mom to put [name] to the breast whenever he demonstrated hunger cues.  Reviewed supply and demand, normal newborn stomach size, normal course of lactation and routine newborn feeding patterns.  Lactation name and number written on white board and encouraged to call with any questions, concerns or assistance.      Maternal Data Formula Feeding for Exclusion: No  Feeding Feeding Type: Breast Fed  LATCH Score                   Interventions    Lactation Tools Discussed/Used     Consult Status      Debra Mcdaniel 03/15/2019, 9:05 PM

## 2019-03-15 NOTE — Anesthesia Preprocedure Evaluation (Signed)
Anesthesia Evaluation  Patient identified by MRN, date of birth, ID band Patient awake    Reviewed: Allergy & Precautions, NPO status , Patient's Chart, lab work & pertinent test results  History of Anesthesia Complications Negative for: history of anesthetic complications  Airway Mallampati: III  TM Distance: >3 FB Neck ROM: Full    Dental no notable dental hx.    Pulmonary neg pulmonary ROS, neg sleep apnea, neg COPD,    breath sounds clear to auscultation- rhonchi (-) wheezing      Cardiovascular Exercise Tolerance: Good (-) hypertension(-) CAD and (-) Past MI  Rhythm:Regular Rate:Normal - Systolic murmurs and - Diastolic murmurs    Neuro/Psych  Headaches, PSYCHIATRIC DISORDERS Anxiety Depression    GI/Hepatic negative GI ROS, Neg liver ROS,   Endo/Other  neg diabetesMorbid obesity  Renal/GU negative Renal ROS     Musculoskeletal negative musculoskeletal ROS (+)   Abdominal (+) + obese,   Peds negative pediatric ROS (+)  Hematology negative hematology ROS (+)   Anesthesia Other Findings Past Medical History: No date: BMI 50.0-59.9, adult (HCC) No date: Depression No date: Headache No date: IBS (irritable bowel syndrome)   Reproductive/Obstetrics (+) Pregnancy                             Anesthesia Physical  Anesthesia Plan  ASA: III  Anesthesia Plan: Spinal   Post-op Pain Management:    Induction: Intravenous  PONV Risk Score and Plan: 2 and Propofol infusion  Airway Management Planned: Nasal Cannula  Additional Equipment:   Intra-op Plan:   Post-operative Plan:   Informed Consent: I have reviewed the patients History and Physical, chart, labs and discussed the procedure including the risks, benefits and alternatives for the proposed anesthesia with the patient or authorized representative who has indicated his/her understanding and acceptance.     Dental  advisory given  Plan Discussed with: CRNA and Anesthesiologist  Anesthesia Plan Comments:         Anesthesia Quick Evaluation

## 2019-03-15 NOTE — OB Triage Note (Signed)
Pt states she has had contractions x 2 days worsening and now they are regular painful and in her back. Pt is a scheduled c/s for Tues day am

## 2019-03-15 NOTE — Discharge Summary (Signed)
OB Discharge Summary     Patient Name: Debra Mcdaniel DOB: 25-Apr-1991 MRN: [id number]  Date of admission: 03/14/2019 Delivering MD: Hoyt Koch, MD  Date of Delivery: 03/15/2019  Date of discharge: 03/18/2019  Admitting diagnosis: Labor at term, Prior Cesarean, Obesity and pregnancy Intrauterine pregnancy: [redacted]w[redacted]d     Secondary diagnosis: None     Discharge diagnosis: same, No other diagnosis                         Hospital course:  Scheduled C/S   28 y.o. yo G2P1001 at [redacted]w[redacted]d was admitted to the hospital 03/14/2019 in early but active labor and was planning for a scheduled cesarean section with the following indication:Elective Repeat.  Membrane Rupture Time/Date: 2:34 AM ,03/15/2019   Patient delivered a Viable infant.03/15/2019  Details of operation can be found in separate operative note.  Pateint had an uncomplicated postpartum course.  She is ambulating, tolerating a regular diet, passing flatus, and urinating well. Patient is discharged home in stable condition on  03/18/19                                                                        Post partum procedures:none  Complications: None  Physical exam on 03/18/2019: Vitals:   03/17/19 0812 03/17/19 1536 03/17/19 2300 03/18/19 0803  BP: (!) 145/77 103/84 93/78 131/68  Pulse: 89 77 82 80  Resp: 20 18 18 20   Temp: 97.6 F (36.4 C) 97.9 F (36.6 C) 97.7 F (36.5 C) 98.4 F (36.9 C)  TempSrc: Oral Oral  Oral  SpO2: 99% 98% 100% 99%  Weight:      Height:       General: alert, cooperative and no distress Lochia: appropriate Uterine Fundus: firm Incision: Dressing is clean, dry, and intact DVT Evaluation: No evidence of DVT seen on physical exam. Calf/Ankle edema is present  Labs: Lab Results  Component Value Date   WBC 9.6 03/16/2019   HGB 9.8 (L) 03/16/2019   HCT 30.8 (L) 03/16/2019   MCV 90.6 03/16/2019   PLT 272 03/16/2019   CMP Latest Ref Rng & Units 03/15/2019  Glucose 65 - 99 mg/dL -  BUN 6 - 20  mg/dL -  Creatinine 0.44 - 1.00 mg/dL 0.69  Sodium 135 - 145 mmol/L -  Potassium 3.5 - 5.1 mmol/L -  Chloride 101 - 111 mmol/L -  CO2 22 - 32 mmol/L -  Calcium 8.9 - 10.3 mg/dL -  Total Protein 6.4 - 8.2 g/dL -  Total Bilirubin 0.2 - 1.0 mg/dL -  Alkaline Phos 50 - 136 Unit/L -  AST 15 - 37 Unit/L -  ALT 12 - 78 U/L -    Discharge instruction: per After Visit Summary.  Medications:  Allergies as of 03/18/2019      Reactions   Nickel Rash      Medication List    STOP taking these medications   acetaminophen 325 MG tablet Commonly known as: TYLENOL     TAKE these medications   multivitamin-prenatal 27-0.8 MG Tabs tablet Take 1 tablet by mouth daily at 12 noon.   oxyCODONE-acetaminophen 5-325 MG tablet Commonly known as: Percocet Take 1 tablet by mouth every 4 (  four) hours as needed for severe pain.            Discharge Care Instructions  (From admission, onward)         Start     Ordered   03/18/19 0000  Discharge wound care:    Comments: You may apply a light dressing for minor discharge from the incision or to keep waistbands of clothing from rubbing.  You may also have been discharge with a clear dressing in which case this will be removed at your postoperative clinic visit.  You may shower, use soap on your incision.  Avoid baths or soaking the incision in the first 6 weeks following your surgery..   03/18/19 0945          Diet: routine diet  Activity: Advance as tolerated. Pelvic rest for 6 weeks.   Outpatient follow up: Follow-up Information    [name], MD. Schedule an appointment as soon as possible for a visit in 2 week(s).   Specialty: Obstetrics and Gynecology Contact information: [address] [place] [address] [phone]             Postpartum contraception: Undecided Rhogam Given postpartum: yes Rubella vaccine given postpartum: no Varicella vaccine given postpartum: no TDaP given antepartum or postpartum:  Yes  Newborn Data: Live born female  Birth Weight: 7 lb 15 oz (3600 g) APGAR: 8, 9  Newborn Delivery   Birth date/time: 03/15/2019 02:35:00 Delivery type: C-Section, Low Transverse Trial of labor: No C-section categorization: Repeat       Baby Feeding: Formula  Disposition: home with mother  SIGNED:  Oswaldo Conroy, CNM 03/18/2019 10:23 AM

## 2019-03-15 NOTE — Anesthesia Procedure Notes (Signed)
Spinal  Patient location during procedure: OB Start time: 03/15/2019 2:00 AM End time: 03/15/2019 2:09 AM Staffing Anesthesiologist: Alvin Critchley, MD Performed: anesthesiologist  Preanesthetic Checklist Completed: patient identified, site marked, surgical consent, pre-op evaluation, timeout performed, IV checked, risks and benefits discussed and monitors and equipment checked Spinal Block Patient position: sitting Prep: Betadine Patient monitoring: heart rate, continuous pulse ox, blood pressure and cardiac monitor Approach: right paramedian Location: L3-4 Injection technique: single-shot Needle Needle type: Whitacre and Introducer  Needle gauge: 24 G Needle length: 9 cm Additional Notes Time out called.  Patient placed in sitting position.  Back prepped and draped in sterile fashion.  A skin wheal was made in the L3-L4 interspace with 1% Lidocaine plain.  A long 25G  Whitacre needle was advanced in the right paramedian position with the return of clear CSF in all 4 quads.  A very transient paresthesia to the left was felt , but resolved within a second.  The patient tolerated the procedure well.  Level was T4T4 to pin prick.

## 2019-03-15 NOTE — Progress Notes (Addendum)
Date of Initial H&P: 03/09/2019  History reviewed, patient examined, changes noted below:   29 year old G2 P1001 BF with EDC=03/21/2019  presents at 39wk1d by LMP with complaints of painful contractions x 2 hours PTA. History of prior cesarean section and desires repeat.  She was scheduled for a repeat Cesarean section 11/10. BMI 61, status post anesthesia consult.  Pregnancy has otherwise been uncomplicated. Last ate at [facility] on 11/7, last drank at 2200 last night Pregravid weight 305 lb (138.3 kg) Total Weight Gain 30 lb (13.6 kg)  Body mass index is 61.09 kg/m. L:ast EFW 10/20 was 6#15oz [facility] Prenatal Labs  Dating LMP = 12 week [place] Blood type: O negative  Genetic Screen NIPS: Normal XY Inheritest: negative Antibody:Negative  Anatomic [place]  Rubella:Immune Varicella: Immune  GTT Early:89 Third trimester:137 RPR: NR  Rhogam 01/09/19 HBsAg: negative  TDaP vaccine                       Flu Shot: HIV: negative  Baby Food Bottle                            [id number]  Contraception Undecided Pap:01/08/2018 NIL  Hgb  AA   CS/VBAC Repeat C-section 11/10   Support Person Husband Marcus        General: breathing with some contractions Vital Signs: BP (!) 133/95 (BP Location: Right Arm)   Pulse (!) 119   Temp 99.1 F (37.3 C) (Oral)   Resp 16   LMP 06/14/2018 (Exact Date)   SpO2 100%   Heart: RRR without murmur Lungs: CTAB  Abdomen: obese, gravid, cephalic FHR: 382 baseline with accelerations to 150s to 160s, moderate variability, ?variable decelerations with some contractions Toco: contractions every 4-5 minutes apart  Cervix: 2.5/80-90%/-1  A: IUP at 39wk1d in early labor Prior Cesarean section-desires ERCS  P: Consulted Dr Kenton Kingfisher Admit and prepare for repeat Cesarean section Covid 19 test was negative CBC, T&S, RPR IV, NPO SCDs Ancef 3 gm and Azithromycin 500 mgm on call to [place], CNM   History and Physical Interval Note:  03/15/2019 1:21  AM  Tawny Hopping  has presented today for surgery, with the diagnosis of prior cesarean section at term 39 weeks. The various methods of treatment have been discussed with the patient and family. After consideration of risks, benefits and other options for treatment, the patient has consented to  [place] as a surgical intervention .  The patient's history has been reviewed, patient examined, no change in status, stable for surgery.  Pt has the following beta blocker history-  Not taking Beta Blocker.  I have reviewed the patient's chart and labs.  Questions were answered to the patient's satisfaction.    Barnett Applebaum, MD, [facility], [facility] 03/15/2019  1:21 AM

## 2019-03-15 NOTE — Discharge Instructions (Signed)

## 2019-03-16 ENCOUNTER — Encounter: Payer: Self-pay | Admitting: Obstetrics & Gynecology

## 2019-03-16 LAB — CBC
HCT: 30.8 % — ABNORMAL LOW (ref 36.0–46.0)
Hemoglobin: 9.8 g/dL — ABNORMAL LOW (ref 12.0–15.0)
MCH: 28.8 pg (ref 26.0–34.0)
MCHC: 31.8 g/dL (ref 30.0–36.0)
MCV: 90.6 fL (ref 80.0–100.0)
Platelets: 272 10*3/uL (ref 150–400)
RBC: 3.4 MIL/uL — ABNORMAL LOW (ref 3.87–5.11)
RDW: 14.4 % (ref 11.5–15.5)
WBC: 9.6 10*3/uL (ref 4.0–10.5)
nRBC: 0 % (ref 0.0–0.2)

## 2019-03-16 LAB — RPR: RPR Ser Ql: NONREACTIVE

## 2019-03-16 MED ORDER — MORPHINE SULFATE (PF) 2 MG/ML IV SOLN: 1.0000 mg | INTRAVENOUS | Status: DC | PRN

## 2019-03-16 MED ORDER — KETOROLAC TROMETHAMINE 15 MG/ML IJ SOLN
15.0000 mg | Freq: Three times a day (TID) | INTRAMUSCULAR | Status: DC | PRN
  Filled 2019-03-16: qty 1

## 2019-03-16 MED ORDER — HYDROCODONE-ACETAMINOPHEN 5-325 MG PO TABS: 1.0000 | ORAL_TABLET | Freq: Four times a day (QID) | ORAL | Status: DC | PRN

## 2019-03-16 MED ORDER — RHO D IMMUNE GLOBULIN 1500 UNIT/2ML IJ SOSY
300.0000 ug | PREFILLED_SYRINGE | Freq: Once | INTRAMUSCULAR | Status: AC
  Administered 2019-03-16: 300 ug via INTRAVENOUS
  Filled 2019-03-16: qty 2

## 2019-03-16 MED ORDER — IBUPROFEN 800 MG PO TABS
800.0000 mg | ORAL_TABLET | Freq: Four times a day (QID) | ORAL | Status: DC
  Administered 2019-03-17 – 2019-03-18 (×6): 800 mg via ORAL
  Filled 2019-03-16 (×8): qty 1

## 2019-03-16 NOTE — Progress Notes (Signed)
Obstetric Postpartum/PostOperative Daily Progress Note Subjective:  28 y.o. [id number] post-operative day # 1 status post repeat cesarean delivery.  She is ambulating, is tolerating po, is voiding spontaneously.  Her pain is well controlled on PO pain medications. Her lochia is less than menses.   Medications SCHEDULED MEDICATIONS  . enoxaparin (LOVENOX) injection  40 mg Subcutaneous Q24H  . ibuprofen  800 mg Oral Q6H  . prenatal multivitamin  1 tablet Oral Q1200  . senna-docusate  2 tablet Oral Q24H  . simethicone  80 mg Oral TID PC  . simethicone  80 mg Oral Q24H    MEDICATION INFUSIONS  . lactated ringers Stopped (03/15/19 0345)    PRN MEDICATIONS  coconut oil, witch hazel-glycerin **AND** dibucaine, diphenhydrAMINE, menthol-cetylpyridinium, morphine injection, oxyCODONE-acetaminophen, simethicone, zolpidem    Objective:   Vitals:   03/15/19 2349 03/16/19 0100 03/16/19 0300 03/16/19 0753  BP: 116/65   122/65  Pulse: 96 (!) 107 95 78  Resp: 18   20  Temp: 98.3 F (36.8 C)   98.5 F (36.9 C)  TempSrc: Oral   Oral  SpO2: 97% 95% 95% 98%  Weight:      Height:        Current Vital Signs 24h Vital Sign Ranges  T 98.5 F (36.9 C) Temp  Avg: 98.4 F (36.9 C)  Min: 98.3 F (36.8 C)  Max: 98.5 F (36.9 C)  BP 122/65 BP  Min: 116/65  Max: 129/67  HR 78 Pulse  Avg: 97  Min: 78  Max: 107  RR 20 Resp  Avg: 18.7  Min: 18  Max: 20  SaO2 98 % Room Air SpO2  Avg: 96.7 %  Min: 95 %  Max: 99 %       24 Hour I/O Current Shift I/O  Time Ins Outs 11/08 0701 - 11/09 0700 In: 480 [P.O.:480] Out: 1250 [Urine:1250] 11/09 0701 - 11/09 1900 In: 240 [P.O.:240] Out: -   General: NAD Pulmonary: no increased work of breathing Abdomen: non-distended, non-tender, fundus firm at level of umbilicus Inc: Clean/dry/intact Extremities: no edema, no erythema, no tenderness  Labs:  Recent Labs  Lab 03/15/19 0100 03/16/19 0548  WBC 10.9* 9.6  HGB 11.6* 9.8*  HCT 37.1 30.8*  PLT 347 272      Assessment:   28 y.o. G2P2002 postoperative day # 1 status post repeat cesarean section  Plan:  1) Acute blood loss anemia - hemodynamically stable and asymptomatic - po ferrous sulfate  2) O NEG/Rubella 2.36 (04/29 1452)/Varicella Immune Rhogam indicated  3) TDAP status needs postpartum  4) breast /Contraception = undecided  5) Disposition: continue current care   Rod Can, CNM 03/16/2019 11:39 AM

## 2019-03-16 NOTE — Clinical Social Work Maternal (Signed)
  CLINICAL SOCIAL WORK MATERNAL/CHILD NOTE  Patient Details  Name: Debra Mcdaniel MRN: [id number] Date of Birth: October 18, 1990  Date:  03/16/2019  Clinical Social Worker Initiating Note:  [employer], LCSW Date/Time: Initiated:  03/16/19/1200     Child's Name:  Debra Mcdaniel   Biological Parents:  Mother, Father   Need for Interpreter:  None   Reason for Referral:  Other (Comment)(History of postpartum depression.)   Address:  [place] [address]    Phone number:  [phone] (home)     Additional phone number:   Household Members/Support Persons (HM/SP):   Household Member/Support Person 1   HM/SP Name Relationship DOB or Age  HM/SP -[place] husband    HM/SP -2        HM/SP -3        HM/SP -4        HM/SP -5        HM/SP -6        HM/SP -7        HM/SP -8          Natural Supports (not living in the home):  Parent   Professional Supports:     Employment: Animator   Type of Work:     Education:  Nurse, adult   Homebound arranged:    Museum/gallery curator Resources:  Multimedia programmer   Other Resources:      Cultural/Religious Considerations Which May Impact Care:    Strengths:  Ability to meet basic needs    Psychotropic Medications:         Pediatrician:       Pediatrician List:   [place]      Pediatrician Fax Number:    Risk Factors/Current Problems:  None   Cognitive State:  Insightful    Mood/Affect:  Happy , Relaxed , Calm    CSW Assessment: Clinical Education officer, museum (CSW) received consult that mother had postpartum depression with her first baby. Per RN mother scored 8 on depression screen. Per RN there are no other concerns. CSW met with mother and infant at bedside. Father of the infant Debra Mcdaniel was asleep in the room during assessment. CSW introduced self and explained role of CSW department. Per mother this is  her second baby and she has all the supplies needed for infant. Per mother she has a 27 y.o son. Mother reported that she has a new car seat. CSW provided education on safe sleep and provided mother with safe sleep handout. Per mother she had postpartum depression with her first baby. Mother reported that she is feeling fine now and has no symptoms of depression. Mother reported that she is not having thoughts of hurting herself or anybody else. CSW provided education on postpartum mood disorders and provided mother with postpartum mood disorders check list. CSW also provided mother with a list of [place] mental health services. Mother accepted resources and reported no other needs or concerns. Please reconsult if future social work needs arise. CSW signing off.   CSW Plan/Description:  No Further Intervention Required/No Barriers to Discharge    Debra Mcdaniel, Lenice Llamas 03/16/2019, 4:49 PM

## 2019-03-16 NOTE — Anesthesia Post-op Follow-up Note (Signed)
  Anesthesia Pain Follow-up Note  Patient: Debra Mcdaniel  Day #: 1  Date of Follow-up: 03/16/2019 Time: 8:01 AM  Last Vitals:  Vitals:   03/16/19 0300 03/16/19 0753  BP:  122/65  Pulse: 95 78  Resp:  20  Temp:  36.9 C  SpO2: 95% 98%    Level of Consciousness: alert  Pain: none   Side Effects:None  Catheter Site Exam:clean, dry, no drainage  Anti-Coag Meds (From admission, onward)   Start     Dose/Rate Route Frequency Ordered Stop   03/16/19 0800  enoxaparin (LOVENOX) injection 40 mg     40 mg Subcutaneous Every 24 hours 03/15/19 0331         Plan: D/C from anesthesia care at surgeon's request  Maydell Knoebel Lorenza Chick

## 2019-03-16 NOTE — Lactation Note (Signed)
This note was copied from a baby's chart. Lactation Consultation Note  Patient Name: Debra Mcdaniel [id number] Date: 03/16/2019   Mom is still struggling to breast feed without lots of assistance.  [name] is under phototherapy for hyperbilirubinemia and has been given a pacifier through the night to settle him down from screaming.  Mom is still having lots of incisional pain and states that the Ibuprofen is not working.  Every time she tries to breast feed or pump, she experiences terrible cramping.  Because of mom's large breasts, we have been having to put a blanket roll under mom's breasts which makes her really hot and uncomfortable.  We tried a sling made out of mesh panties put around her neck like a halter to help support her breasts which seemed to work better.  A hole was cut out which seemed to help to hold pump flanges in place for pumping.  Mom agreed to let lactation help her with one more breast feed.  [name] latched with minimal assistance and breast fed for 25 minutes.  After the breast feed, mom agreed to pump.  She got a few drops which Father of baby put on his finger and let [name] suck on his finger.  Father of baby is supportive and encouraging of breast feeding, but is understanding that this is just too much for mom right now.  Mom does have a Spectra pump that she got through her [employer].  Mom wants to keep pumping for stimulation while here and after she gets home, but does not want to put to breast for now.  Mom tried to breast feed her first baby, but was never able to get her to latch on her own.  Lactation name and number written on white board and encouraged to call with any questions, concerns or assistance.   Maternal Data    Feeding Feeding Type: Formula Nipple Type: Slow - flow  LATCH Score                   Interventions    Lactation Tools Discussed/Used     Consult Status      Debra Mcdaniel 03/16/2019, 10:19 PM

## 2019-03-16 NOTE — Anesthesia Postprocedure Evaluation (Signed)
Anesthesia Post Note  Patient: Debra Mcdaniel  Procedure(s) Performed: CESAREAN SECTION (N/A )  Patient location during evaluation: Mother Baby Anesthesia Type: Spinal Level of consciousness: oriented and awake and alert Pain management: pain level controlled Vital Signs Assessment: post-procedure vital signs reviewed and stable Respiratory status: spontaneous breathing and respiratory function stable Cardiovascular status: blood pressure returned to baseline and stable Postop Assessment: no headache, no backache, no apparent nausea or vomiting and able to ambulate Anesthetic complications: no     Last Vitals:  Vitals:   03/16/19 0300 03/16/19 0753  BP:  122/65  Pulse: 95 78  Resp:  20  Temp:  36.9 C  SpO2: 95% 98%    Last Pain:  Vitals:   03/16/19 0753  TempSrc: Oral  PainSc:                  Hedda Slade

## 2019-03-17 ENCOUNTER — Inpatient Hospital Stay
Admission: RE | Admit: 2019-03-17 | Payer: Managed Care, Other (non HMO) | Source: Home / Self Care | Admitting: Obstetrics and Gynecology

## 2019-03-17 LAB — RHOGAM INJECTION: Unit division: 0

## 2019-03-17 SURGERY — Surgical Case
Anesthesia: Choice

## 2019-03-17 NOTE — Progress Notes (Signed)
POD#2 rLTCS Subjective:  Feeling well, ambulating in room without problems. No difficulties voiding, tolerating a regular diet. Pain is well controlled.  Objective:  Blood pressure (!) 145/77, pulse 89, temperature 97.6 F (36.4 C), temperature source Oral, resp. rate 20, height 5\' 2"  (1.575 m), weight (!) 152 kg, last menstrual period 06/14/2018, SpO2 99 %.  General: NAD Pulmonary: no increased work of breathing Abdomen: non-distended, non-tender, fundus firm, lochia appropriate Incision: C/D/I Extremities: no edema, no erythema, no tenderness  Results for orders placed or performed during the hospital encounter of 03/14/19 (from the past 72 hour(s))  RPR     Status: None   Collection Time: 03/15/19 12:51 AM  Result Value Ref Range   RPR Ser Ql NON REACTIVE NON REACTIVE    Comment: Performed at [facility], [address] [place], [place]  Type and screen [facility]     Status: None   Collection Time: 03/15/19 12:51 AM  Result Value Ref Range   ABO/RH(D) O NEG    Antibody Screen NEG    Sample Expiration      03/18/2019,2359 Performed at [facility], [address] [place], [place]   CBC     Status: Abnormal   Collection Time: 03/15/19  1:00 AM  Result Value Ref Range   WBC 10.9 (H) 4.0 - 10.5 K/uL   RBC 4.01 3.87 - 5.11 MIL/uL   Hemoglobin 11.6 (L) 12.0 - 15.0 g/dL   HCT 13/08/20 97.0 - 26.3 %   MCV 92.5 80.0 - 100.0 fL   MCH 28.9 26.0 - 34.0 pg   MCHC 31.3 30.0 - 36.0 g/dL   RDW 78.5 88.5 - 02.7 %   Platelets 347 150 - 400 K/uL   nRBC 0.0 0.0 - 0.2 %    Comment: Performed at [facility], [address] [place], [place]  Creatinine, serum     Status: None   Collection Time: 03/15/19  6:26 AM  Result Value Ref Range   Creatinine, Ser 0.69 0.44 - 1.00 mg/dL   GFR calc non Af Amer >60 >60 mL/min   GFR calc Af Amer >60 >60 mL/min    Comment: Performed at [facility], [address] [place], [place]  Fetal screen     Status: None   Collection Time: 03/15/19  6:26 AM  Result Value Ref Range   Fetal Screen      NEG Performed at [facility], [address] [place], [place]   Rhogam injection     Status: None   Collection Time: 03/15/19  6:26 AM  Result Value Ref Range   Unit Number 13/08/20    Blood Component Type RHIG    Unit division 00    Status of Unit ISSUED,FINAL    Transfusion Status      OK TO TRANSFUSE Performed at [facility], [address] [place], [place]   CBC     Status: Abnormal   Collection Time: 03/16/19  5:48 AM  Result Value Ref Range   WBC 9.6 4.0 - 10.5 K/uL   RBC 3.40 (L) 3.87 - 5.11 MIL/uL   Hemoglobin 9.8 (L) 12.0 - 15.0 g/dL   HCT 13/09/20 (L) 35.4 - 65.6 %   MCV 90.6 80.0 - 100.0 fL   MCH 28.8 26.0 - 34.0 pg   MCHC 31.8 30.0 - 36.0 g/dL   RDW 81.2 75.1 - 70.0 %  Platelets 272 150 - 400 K/uL   nRBC 0.0 0.0 - 0.2 %    Comment: Performed at [facility], [address], [place], [place] [address]     Assessment:   28 y.o. [phone] post-operative day #2 in good condition.  Plan:  1) Acute blood loss anemia - hemodynamically stable and asymptomatic - Continue prenatal vitamins with iron  2) Blood Type --/--/O NEG (11/08 0051) / Rubella 2.36 (04/29 1452) / Varicella Immune Information for the patient's newborn:  Kimble, Delaurentis [[id number]]  O POS   -RhoGAM given  3) TDAP status: did not receive antepartum  4) Formula feeding; undecided about contraception  5) Disposition: continue postpartum care, consider discharge later today if newborn may discharge.  Avel Sensor, CNM 03/17/2019  9:48 AM

## 2019-03-17 NOTE — Lactation Note (Signed)
This note was copied from a baby's chart. Lactation Consultation Note  Patient Name: Debra Mcdaniel Auman [id number] Date: 03/17/2019   [facility] in to see mom and baby [name]. [name] has received formula bottles through out last night, and this morning. Mom feels more comfortable at this time bottle feeding, and is considering pumping once she is discharged home. She has a personal DEBP provided by insurance that she plans to use. [name] is tolerating formula well, 25mL per feed, and mom feels this is the best for her at this time. Reviewed milk supply and demand, and importance of early onset of milk production to ensure adequate supply as baby grows, encouraged mom to continue utilizing DEBP set-up in the room for breast stimulation now, instead of delaying until returning home. Reviewed digestion of breastmilk versus formula, and baby's ability to easily digest breastmilk leading to more frequent feedings versus formula feeding. Reviewed paced bottle feeding technique and position to prevent over feeding. Provided information for outpatient lactation support, and breastfeeding support groups. Mom had no further questions/concerns at this time. Encouraged to call out to [facility] for support as needed.  Maternal Data    Feeding Feeding Type: Bottle Fed - Formula Nipple Type: Slow - flow  LATCH Score                   Interventions    Lactation Tools Discussed/Used     Consult Status      Lavonia Drafts 03/17/2019, 12:10 PM

## 2019-03-18 MED ORDER — OXYCODONE-ACETAMINOPHEN 5-325 MG PO TABS: 1.0000 | ORAL_TABLET | ORAL | 0 refills | Status: DC | PRN

## 2019-03-18 NOTE — Progress Notes (Signed)
Attempted to try to apply abd binder per pt wish.  Unable to get it to wrap with 2 binders.

## 2019-03-18 NOTE — Progress Notes (Signed)
DC inst reviewed with pt.  Verb u/o of f/u appt. And care of self at home.

## 2019-03-18 NOTE — Progress Notes (Signed)
DC to home.  To car via [place] with baby

## 2019-03-18 NOTE — Lactation Note (Signed)
This note was copied from a baby's chart. Lactation Consultation Note  Patient Name: Debra Mcdaniel [id number] Date: 03/18/2019 Reason for consult: Follow-up assessment  LC intern visited mom for one final consult prior to discharge to assess feeding plan.  Mom only wants to pump and feed formula with no breastfeeding interest.  [place] intern reviewed frequency of milk removal to protect her supply, 8-12x a day, every 2-3 hours.  Mom was taught bottle paced feeding to avoid overfeeding baby and prevent abdominal discomfort.  [facility] intern also reviewed the new guidelines for powdered formula, bringing milk to a boil, mixing it with the powder, allowing it to cool, and feeding it to baby.  Mom was taught about batch-making formula bottles for each day and how water temperature is essential for bacterial death.  Mom was receptive to the education and insists on practicing both formula and pumped milk bottle feeding.  Interventions Interventions: DEBP  Lactation Tools Discussed/Used     Consult Status Consult Status: Complete Date: 03/18/19    Lavonia Drafts 03/18/2019, 2:44 PM

## 2019-03-23 NOTE — Telephone Encounter (Signed)
Does she need to be seen?

## 2019-03-23 NOTE — Telephone Encounter (Signed)
Prob so, needs staples removed too this week if not today

## 2019-03-23 NOTE — Telephone Encounter (Signed)
Dr. Kenton Kingfisher did her surgery but it sounds like the vac isn't holding suction so needs to have dressing taken off

## 2019-03-24 ENCOUNTER — Encounter: Payer: Self-pay | Admitting: Obstetrics and Gynecology

## 2019-03-24 ENCOUNTER — Other Ambulatory Visit: Payer: Self-pay

## 2019-03-24 ENCOUNTER — Ambulatory Visit (INDEPENDENT_AMBULATORY_CARE_PROVIDER_SITE_OTHER): Payer: Managed Care, Other (non HMO) | Admitting: Obstetrics and Gynecology

## 2019-03-24 VITALS — BP 132/80 | HR 76 | Wt 317.0 lb

## 2019-03-24 DIAGNOSIS — Z4889 Encounter for other specified surgical aftercare: Secondary | ICD-10-CM

## 2019-03-24 MED ORDER — SERTRALINE HCL 50 MG PO TABS: 50.0000 mg | ORAL_TABLET | Freq: Every day | ORAL | 2 refills | Status: DC

## 2019-03-24 MED ORDER — HYDROCODONE-ACETAMINOPHEN 5-325 MG PO TABS: 1.0000 | ORAL_TABLET | ORAL | 0 refills | Status: DC | PRN

## 2019-03-24 NOTE — Progress Notes (Signed)
Postoperative Follow-up Patient presents post op from RLTCS 1weeks ago for history of prior C-section.  Subjective: Patient reports some improvement in her preop symptoms. Eating a regular diet without difficulty. Pain controlled on percocet but feels to strong and sedating  Activity: normal activities of daily living.  Objective: Blood pressure 132/80, pulse 76, weight (!) 317 lb (143.8 kg), last menstrual period 06/14/2018, not currently breastfeeding.  General: NAD Pulmonary: no increased work of breathing Abdomen: soft, non-tender, non-distended, incision(s) D/C/I.  Staples removed steri strips applied Extremities: no edema Neurologic: normal gait    Admission on 03/14/2019, Discharged on 03/18/2019  Component Date Value Ref Range Status  . RPR Ser Ql 03/15/2019 NON REACTIVE  NON REACTIVE Final   Performed at [facility], [place] [address] [place], [place] [address]  . ABO/RH(D) 03/15/2019 O NEG   Final  . Antibody Screen 03/15/2019 NEG   Final  . Sample Expiration 03/15/2019    Final                   Value:03/18/2019,2359 Performed at [facility], [address], [place], [place] [address]   . WBC 03/15/2019 10.9* 4.0 - 10.5 K/uL Final  . RBC 03/15/2019 4.01  3.87 - 5.11 MIL/uL Final  . Hemoglobin 03/15/2019 11.6* 12.0 - 15.0 g/dL Final  . HCT 03/15/2019 37.1  36.0 - 46.0 % Final  . MCV 03/15/2019 92.5  80.0 - 100.0 fL Final  . MCH 03/15/2019 28.9  26.0 - 34.0 pg Final  . MCHC 03/15/2019 31.3  30.0 - 36.0 g/dL Final  . RDW 03/15/2019 14.4  11.5 - 15.5 % Final  . Platelets 03/15/2019 347  150 - 400 K/uL Final  . nRBC 03/15/2019 0.0  0.0 - 0.2 % Final   Performed at [facility], [address] [place], [place] [address]  . Creatinine, Ser 03/15/2019 0.69  0.44 - 1.00 mg/dL Final  . GFR calc non Af Amer 03/15/2019 >60  >60 mL/min Final  . GFR calc Af Amer 03/15/2019 >60  >60 mL/min Final   Performed at [facility], [place] [place], [place] [address]  . Fetal Screen 03/15/2019    Final                   Value:NEG Performed at [facility], [address] [place], [place] [address]   . Unit Number 03/15/2019 [id number]   Final  . Blood Component Type 03/15/2019 RHIG   Final  . Unit division 03/15/2019 00   Final  . Status of Unit 03/15/2019 ISSUED,FINAL   Final  . Transfusion Status 03/15/2019    Final                   Value:OK TO TRANSFUSE Performed at [facility], [address], [place],  [address]   . WBC 03/16/2019 9.6  4.0 - 10.5 K/uL Final  . RBC 03/16/2019 3.40* 3.87 - 5.11 MIL/uL Final  . Hemoglobin 03/16/2019 9.8* 12.0 - 15.0 g/dL Final  . HCT 03/16/2019 30.8* 36.0 - 46.0 % Final  . MCV 03/16/2019 90.6  80.0 - 100.0 fL Final  . MCH 03/16/2019 28.8  26.0 - 34.0 pg Final  . MCHC 03/16/2019 31.8  30.0 - 36.0 g/dL Final  . RDW 03/16/2019 14.4  11.5 - 15.5 % Final  . Platelets 03/16/2019 272  150 - 400 K/uL Final  . nRBC 03/16/2019 0.0  0.0 -  0.2 % Final   Performed at [facility], [address] [place], [place] [address]    Assessment: 28 y.o. s/p RLTCS stable  Plan: Patient has done well after surgery with no apparent complications.  I have discussed the post-operative course to date, and the expected progress moving forward.  The patient understands what complications to be concerned about.  I will see the patient in routine follow up, or sooner if needed.    Activity plan: No heavy lifting.  Staples removed steri strips applied Start zoloft Switch to vicodin sedation on oxycodone   [name], MD, [facility], [facility] 03/26/2019, 9:34 PM

## 2019-04-13 ENCOUNTER — Other Ambulatory Visit: Payer: Self-pay

## 2019-04-13 ENCOUNTER — Ambulatory Visit (INDEPENDENT_AMBULATORY_CARE_PROVIDER_SITE_OTHER): Payer: Managed Care, Other (non HMO) | Admitting: Obstetrics and Gynecology

## 2019-04-13 ENCOUNTER — Encounter: Payer: Self-pay | Admitting: Obstetrics and Gynecology

## 2019-04-13 DIAGNOSIS — O99345 Other mental disorders complicating the puerperium: Secondary | ICD-10-CM | POA: Diagnosis not present

## 2019-04-13 DIAGNOSIS — F53 Postpartum depression: Secondary | ICD-10-CM

## 2019-04-13 NOTE — Progress Notes (Signed)
I connected with Debra Mcdaniel on 04/13/19 at  3:10 PM EST by telephone and verified that I am speaking with the correct person using two identifiers.   I discussed the limitations, risks, security and privacy concerns of performing an evaluation and management service by telephone and the availability of in person appointments. I also discussed with the patient that there may be a patient responsible charge related to this service. The patient expressed understanding and agreed to proceed.  The patient was at home. I spoke with the patient from my workstation phone The names of people involved in this encounter were: Debra Mcdaniel , and [name]   Obstetrics & Gynecology Office Visit   Chief Complaint:  Chief Complaint  Patient presents with  . Follow-up    Zoloft follow up    History of Present Illness: The patient is a 28 y.o. female presenting follow up for symptoms of anxiety and depression.  The patient is currently taking 50mg  for the management of her symptoms.  She has not had any recent situational stressors.  She reports symptoms have improved.  She denies anhedonia, day time somnolence, insomnia, irritability, social anxiety, agorophobia, feelings of guilt, feelings of worthlessness, suicidal ideation, homicidal ideation, auditory hallucinations and visual hallucinations. Symptoms have improved since last visit.     Review of Systems: review of systems negative unless otherwise noted in HPI  Past Medical History:  Past Medical History:  Diagnosis Date  . Anxiety   . BMI 50.0-59.9, adult ([facility])   . Depression   . Headache   . IBS (irritable bowel syndrome)     Past Surgical History:  Past Surgical History:  Procedure Laterality Date  . CESAREAN SECTION    . CESAREAN SECTION N/A 03/15/2019   Procedure: CESAREAN SECTION;  Surgeon: Gae Dry, MD;  Location: [facility] ORS;  Service: Obstetrics;  Laterality: N/A;  . COLONOSCOPY WITH PROPOFOL N/A 07/24/2017   Procedure: COLONOSCOPY WITH PROPOFOL;  Surgeon: Toledo, Benay Pike, MD;  Location: [facility] ENDOSCOPY;  Service: Gastroenterology;  Laterality: N/A;  . ESOPHAGOGASTRODUODENOSCOPY (EGD) WITH PROPOFOL N/A 07/24/2017   Procedure: ESOPHAGOGASTRODUODENOSCOPY (EGD) WITH PROPOFOL;  Surgeon: Toledo, Benay Pike, MD;  Location: [facility] ENDOSCOPY;  Service: Gastroenterology;  Laterality: N/A;  . MIrena   02/2013    Gynecologic History: No LMP recorded.  Obstetric History: [id number]  Family History:  Family History  Problem Relation Age of Onset  . Hypertension Mother   . Hypothyroidism Mother   . Obesity Mother   . Thyroid disease Mother   . Hypertension Maternal Grandmother   . Hypertension Maternal Grandfather   . Hypertension Paternal Grandmother   . Hypertension Paternal Grandfather   . Vision loss Father   . Breast cancer Neg Hx   . Ovarian cancer Neg Hx     Social History:  Social History   Socioeconomic History  . Marital status: Married    Spouse name: Not on file  . Number of children: Not on file  . Years of education: Not on file  . Highest education level: Not on file  Occupational History  . Occupation: [employer]  . Financial resource strain: Not on file  . Food insecurity    Worry: Not on file    Inability: Not on file  . Transportation needs    Medical: Not on file    Non-medical: Not on file  Tobacco Use  . Smoking status: Never Smoker  . Smokeless tobacco: Never Used  Substance and Sexual  Activity  . Alcohol use: Not Currently    Frequency: Never  . Drug use: No  . Sexual activity: Yes    Birth control/protection: None  Lifestyle  . Physical activity    Days per week: Not on file    Minutes per session: Not on file  . Stress: Not on file  Relationships  . Social Musician on phone: Not on file    Gets together: Not on file    Attends religious service: Not on file    Active member of club or organization: Not on file    Attends  meetings of clubs or organizations: Not on file    Relationship status: Not on file  . Intimate partner violence    Fear of current or ex partner: Not on file    Emotionally abused: Not on file    Physically abused: Not on file    Forced sexual activity: Not on file  Other Topics Concern  . Not on file  Social History Narrative   ** Merged History Encounter **        Allergies:  Allergies  Allergen Reactions  . Nickel Rash    Medications: Prior to Admission medications   Medication Sig Start Date End Date Taking? Authorizing Provider  Prenatal Vit-Fe Fumarate-FA (MULTIVITAMIN-PRENATAL) 27-0.8 MG TABS tablet Take 1 tablet by mouth daily at 12 noon.   Yes [provider]  sertraline (ZOLOFT) 50 MG tablet Take 1 tablet (50 mg total) by mouth daily. 03/24/19  Yes [name], MD    Physical Exam Vitals: There were no vitals filed for this visit. No LMP recorded.  No physical exam as this was a remote telephone visit to promote social distancing during the current COVID-19 Pandemic   Edinburgh Postnatal Depression Scale - 04/13/19 1440      Edinburgh Postnatal Depression Scale:  In the Past 7 Days   I have been able to laugh and see the funny side of things.  0    I have looked forward with enjoyment to things.  1    I have blamed myself unnecessarily when things went wrong.  2    I have been anxious or worried for no good reason.  2    I have felt scared or panicky for no good reason.  1    Things have been getting on top of me.  0    I have been so unhappy that I have had difficulty sleeping.  0    I have felt sad or miserable.  1    I have been so unhappy that I have been crying.  1    The thought of harming myself has occurred to me.  0    Edinburgh Postnatal Depression Scale Total  8       Assessment: 28 y.o. [id number] follow up anxiety and depression  Plan: Problem List Items Addressed This Visit    None      1) Anxiety/Depression - continue  Zoloft 50mg  po daily  2) Thyroid and B12 screen has not been obtained previously  3) Telephone time 12:27min  4) Return in about 2 weeks (around 04/27/2019) for 6 week postpartum harris.    04/29/2019, MD, [name] [facility], [facility] 04/13/2019, 2:45 PM

## 2019-04-23 ENCOUNTER — Telehealth: Payer: Self-pay

## 2019-04-23 NOTE — Telephone Encounter (Signed)
FMLA/DISABILITY form for [employer] filled out, signature obtained, and given to KT for processing. 

## 2019-05-04 ENCOUNTER — Encounter: Payer: Self-pay | Admitting: Obstetrics and Gynecology

## 2019-05-04 ENCOUNTER — Ambulatory Visit (INDEPENDENT_AMBULATORY_CARE_PROVIDER_SITE_OTHER): Payer: Managed Care, Other (non HMO) | Admitting: Obstetrics and Gynecology

## 2019-05-04 ENCOUNTER — Other Ambulatory Visit: Payer: Self-pay

## 2019-05-04 DIAGNOSIS — O99345 Other mental disorders complicating the puerperium: Secondary | ICD-10-CM

## 2019-05-04 DIAGNOSIS — Z1389 Encounter for screening for other disorder: Secondary | ICD-10-CM | POA: Diagnosis not present

## 2019-05-04 DIAGNOSIS — F53 Postpartum depression: Secondary | ICD-10-CM

## 2019-05-04 MED ORDER — SERTRALINE HCL 100 MG PO TABS: 100.0000 mg | ORAL_TABLET | Freq: Every day | ORAL | 2 refills | Status: DC

## 2019-05-04 NOTE — Progress Notes (Signed)
Postpartum Visit  Chief Complaint:  Chief Complaint  Patient presents with  . Postpartum Care    C/S 11/8    History of Present Illness: Patient is a 28 y.o. [id number] presents for postpartum visit.  Date of delivery: 03/15/2019 Cesarean Section: Elective repeat Pregnancy or labor problems:  no Any problems since the delivery:  no  Newborn Details:  SINGLETON :  1. BabyGender female Maddox Birth weight: 7lbs 15oz Maternal Details:  Breast or formula feeding: plans to bottle feed Intercourse: No  Contraception after delivery: No  Any bowel or bladder issues: No  Post partum depression/anxiety noted:  yes Edinburgh Post-Partum Depression Score:17 Date of last PAP: 01/08/2018  no abnormalities   Review of Systems: ROS  The following portions of the patient's history were reviewed and updated as appropriate: allergies, current medications, past family history, past medical history, past social history, past surgical history and problem list.  Past Medical History:  Past Medical History:  Diagnosis Date  . Anxiety   . BMI 50.0-59.9, adult ([facility])   . Depression   . Headache   . IBS (irritable bowel syndrome)     Past Surgical History:  Past Surgical History:  Procedure Laterality Date  . CESAREAN SECTION    . CESAREAN SECTION N/A 03/15/2019   Procedure: CESAREAN SECTION;  Surgeon: Nadara MustardHarris, Robert P, MD;  Location: [facility] ORS;  Service: Obstetrics;  Laterality: N/A;  . COLONOSCOPY WITH PROPOFOL N/A 07/24/2017   Procedure: COLONOSCOPY WITH PROPOFOL;  Surgeon: Toledo, Boykin Nearingeodoro K, MD;  Location: [facility] END[facility];  Service: Gastroenterology;  Laterality: N/A;  . ESOPHAGOGASTRODUODENOSCOPY (EGD) WITH PROPOFOL N/A 07/24/2017   Procedure: ESOPHAGOGASTRODUODENOSCOPY (EGD) WITH PROPOFOL;  Surgeon: Toledo, Boykin Nearingeodoro K, MD;  Location: [facility];  Service: Gastroenterology;  Laterality: N/A;  . MIrena   02/2013    Family History:  Family History  Problem Relation Age of Onset  .  Hypertension Mother   . Hypothyroidism Mother   . Obesity Mother   . Thyroid disease Mother   . Hypertension Maternal Grandmother   . Hypertension Maternal Grandfather   . Hypertension Paternal Grandmother   . Hypertension Paternal Grandfather   . Vision loss Father   . Breast cancer Neg Hx   . Ovarian cancer Neg Hx     Social History:  Social History   Socioeconomic History  . Marital status: Married    Spouse name: Not on file  . Number of children: Not on file  . Years of education: Not on file  . Highest education level: Not on file  Occupational History  . Occupation: [employer]  Tobacco Use  . Smoking status: Never Smoker  . Smokeless tobacco: Never Used  Substance and Sexual Activity  . Alcohol use: Not Currently  . Drug use: No  . Sexual activity: Yes    Birth control/protection: None  Other Topics Concern  . Not on file  Social History Narrative   ** Merged History Encounter **       Social Determinants of Health   Financial Resource Strain:   . Difficulty of Paying Living Expenses: Not on file  Food Insecurity:   . Worried About Programme researcher, broadcasting/film/videounning Out of Food in the Last Year: Not on file  . Ran Out of Food in the Last Year: Not on file  Transportation Needs:   . Lack of Transportation (Medical): Not on file  . Lack of Transportation (Non-Medical): Not on file  Physical Activity:   . Days of Exercise per Week:  Not on file  . Minutes of Exercise per Session: Not on file  Stress:   . Feeling of Stress : Not on file  Social Connections:   . Frequency of Communication with Friends and Family: Not on file  . Frequency of Social Gatherings with Friends and Family: Not on file  . Attends Religious Services: Not on file  . Active Member of Clubs or Organizations: Not on file  . Attends Banker Meetings: Not on file  . Marital Status: Not on file  Intimate Partner Violence:   . Fear of Current or Ex-Partner: Not on file  . Emotionally Abused: Not on file   . Physically Abused: Not on file  . Sexually Abused: Not on file    Allergies:  Allergies  Allergen Reactions  . Nickel Rash    Medications: Prior to Admission medications   Medication Sig Start Date End Date Taking? Authorizing Provider  Prenatal Vit-Fe Fumarate-FA (MULTIVITAMIN-PRENATAL) 27-0.8 MG TABS tablet Take 1 tablet by mouth daily at 12 noon.   Yes [provider]  sertraline (ZOLOFT) 50 MG tablet Take 1 tablet (50 mg total) by mouth daily. 03/24/19  Yes [name], MD    Physical Exam Blood pressure 124/70, height 5\' 2"  (1.575 m), weight (!) 314 lb (142.4 kg), not currently breastfeeding.  General: NAD HEENT: normocephalic, anicteric Pulmonary: No increased work of breathing Abdomen: NABS, soft, non-tender, non-distended.  Umbilicus without lesions.  No hepatomegaly, splenomegaly or masses palpable. No evidence of hernia. Incision D/C/I Genitourinary:  External: Normal external female genitalia.  Normal urethral meatus, normal  Bartholin's and Skene's glands.    Vagina: Normal vaginal mucosa, no evidence of prolapse.    Cervix: Grossly normal in appearance, no bleeding  Uterus: Non-enlarged, mobile, normal contour.  No CMT  Adnexa: ovaries non-enlarged, no adnexal masses  Rectal: deferred Extremities: no edema, erythema, or tenderness Neurologic: Grossly intact Psychiatric: mood appropriate, affect full  Edinburgh Postnatal Depression Scale - 05/04/19 1601      Edinburgh Postnatal Depression Scale:  In the Past 7 Days   I have been able to laugh and see the funny side of things.  0    I have looked forward with enjoyment to things.  1    I have blamed myself unnecessarily when things went wrong.  3    I have been anxious or worried for no good reason.  3    I have felt scared or panicky for no good reason.  3    Things have been getting on top of me.  2    I have been so unhappy that I have had difficulty sleeping.  1    I have felt sad or  miserable.  2    I have been so unhappy that I have been crying.  2    The thought of harming myself has occurred to me.  0    Edinburgh Postnatal Depression Scale Total  17       Assessment: 28 y.o. 34 presenting for 6 week postpartum visit  Plan: Problem List Items Addressed This Visit    None    Visit Diagnoses    6 weeks postpartum follow-up    -  Primary   Postpartum depression       Relevant Medications   sertraline (ZOLOFT) 100 MG tablet      1) Contraception - Education given regarding options for contraception, as well as compatibility with breast feeding if applicable.  Patient undecided contraception. -  possibly interested in nuvaring given contraceptive options handout  2)  Pap - ASCCP guidelines and rational discussed.  ASCCP guidelines and rational discussed.  Patient opts for every 3 years screening interval  3) Patient underwent screening for postpartum depression with still EPDS above 10 - increase zoloft to 100mg   4) Return in about 2 weeks (around 05/18/2019) for medication follow up.   [name], MD, [facility], [facility] 05/04/2019, 4:06 PM

## 2019-05-19 ENCOUNTER — Other Ambulatory Visit: Payer: Self-pay

## 2019-05-19 ENCOUNTER — Encounter: Payer: Self-pay | Admitting: Obstetrics and Gynecology

## 2019-05-19 ENCOUNTER — Ambulatory Visit (INDEPENDENT_AMBULATORY_CARE_PROVIDER_SITE_OTHER): Payer: Managed Care, Other (non HMO) | Admitting: Obstetrics and Gynecology

## 2019-05-19 VITALS — BP 116/70 | Ht 62.0 in | Wt 307.0 lb

## 2019-05-19 DIAGNOSIS — O99345 Other mental disorders complicating the puerperium: Secondary | ICD-10-CM

## 2019-05-19 DIAGNOSIS — F53 Postpartum depression: Secondary | ICD-10-CM

## 2019-05-19 DIAGNOSIS — Z30011 Encounter for initial prescription of contraceptive pills: Secondary | ICD-10-CM | POA: Diagnosis not present

## 2019-05-19 MED ORDER — NORGESTIMATE-ETH ESTRADIOL 0.25-35 MG-MCG PO TABS: 1.0000 | ORAL_TABLET | Freq: Every day | ORAL | 11 refills | Status: DC

## 2019-05-19 NOTE — Progress Notes (Signed)
Obstetrics & Gynecology Office Visit   Chief Complaint:  Chief Complaint  Patient presents with  . Follow-up    PP depression    History of Present Illness: The patient is a 29 y.o. female presenting follow up for symptoms of postpartum depression.  The patient is currently taking Zoloft 100mg   for the management of her symptoms.  She has not had any recent situational stressors.  She reports symptoms of anhedonia and social anxiety.  She denies irritability, increased appetite, decreased appetite, agorophobia, feelings of worthlessness, suicidal ideation, homicidal ideation, auditory hallucinations and visual hallucinations. Symptoms have improved since last visit.     Review of Systems: Review of Systems  Constitutional: Negative.   Gastrointestinal: Negative for nausea.  Neurological: Negative for headaches.  Psychiatric/Behavioral: Negative.      Past Medical History:  Past Medical History:  Diagnosis Date  . Anxiety   . BMI 50.0-59.9, adult ([facility])   . Depression   . Headache   . IBS (irritable bowel syndrome)     Past Surgical History:  Past Surgical History:  Procedure Laterality Date  . CESAREAN SECTION    . CESAREAN SECTION N/A 03/15/2019   Procedure: CESAREAN SECTION;  Surgeon: 13/12/2018, MD;  Location: [facility] ORS;  Service: Obstetrics;  Laterality: N/A;  . COLONOSCOPY WITH PROPOFOL N/A 07/24/2017   Procedure: COLONOSCOPY WITH PROPOFOL;  Surgeon: Toledo, 07/26/2017, MD;  Location: [facility] END[facility];  Service: Gastroenterology;  Laterality: N/A;  . ESOPHAGOGASTRODUODENOSCOPY (EGD) WITH PROPOFOL N/A 07/24/2017   Procedure: ESOPHAGOGASTRODUODENOSCOPY (EGD) WITH PROPOFOL;  Surgeon: Toledo, 07/26/2017, MD;  Location: [facility] END[facility];  Service: Gastroenterology;  Laterality: N/A;  . MIrena   02/2013    Gynecologic History: No LMP recorded.  Obstetric History: 03/2013  Family History:  Family History  Problem Relation Age of Onset  . Hypertension Mother   .  Hypothyroidism Mother   . Obesity Mother   . Thyroid disease Mother   . Hypertension Maternal Grandmother   . Hypertension Maternal Grandfather   . Hypertension Paternal Grandmother   . Hypertension Paternal Grandfather   . Vision loss Father   . Breast cancer Neg Hx   . Ovarian cancer Neg Hx     Social History:  Social History   Socioeconomic History  . Marital status: Married    Spouse name: Not on file  . Number of children: Not on file  . Years of education: Not on file  . Highest education level: Not on file  Occupational History  . Occupation: [id number]  . Smoking status: Never Smoker  . Smokeless tobacco: Never Used  Substance and Sexual Activity  . Alcohol use: Not Currently  . Drug use: No  . Sexual activity: Yes    Birth control/protection: None  Other Topics Concern  . Not on file  Social History Narrative   ** Merged History Encounter **       Social Determinants of Health   Financial Resource Strain:   . Difficulty of Paying Living Expenses: Not on file  Food Insecurity:   . Worried About [employer] in the Last Year: Not on file  . Ran Out of Food in the Last Year: Not on file  Transportation Needs:   . Lack of Transportation (Medical): Not on file  . Lack of Transportation (Non-Medical): Not on file  Physical Activity:   . Days of Exercise per Week: Not on file  . Minutes of Exercise per Session: Not  on file  Stress:   . Feeling of Stress : Not on file  Social Connections:   . Frequency of Communication with Friends and Family: Not on file  . Frequency of Social Gatherings with Friends and Family: Not on file  . Attends Religious Services: Not on file  . Active Member of Clubs or Organizations: Not on file  . Attends Archivist Meetings: Not on file  . Marital Status: Not on file  Intimate Partner Violence:   . Fear of Current or Ex-Partner: Not on file  . Emotionally Abused: Not on file  . Physically Abused:  Not on file  . Sexually Abused: Not on file    Allergies:  Allergies  Allergen Reactions  . Nickel Rash    Medications: Prior to Admission medications   Medication Sig Start Date End Date Taking? Authorizing Provider  Prenatal Vit-Fe Fumarate-FA (MULTIVITAMIN-PRENATAL) 27-0.8 MG TABS tablet Take 1 tablet by mouth daily at 12 noon.   Yes [provider]  sertraline (ZOLOFT) 100 MG tablet Take 1 tablet (100 mg total) by mouth daily. 05/04/19  Yes [name], MD    Physical Exam Vitals:  Vitals:   05/19/19 1531  BP: 116/70   No LMP recorded.  General: NAD HEENT: normocephalic, anicteric Pulmonary: No increased work of breathing Neurologic: Grossly intact Psychiatric: mood appropriate, affect full  Edinburgh Postnatal Depression Scale - 05/19/19 1535      Edinburgh Postnatal Depression Scale:  In the Past 7 Days   I have been able to laugh and see the funny side of things.  0    I have looked forward with enjoyment to things.  1    I have blamed myself unnecessarily when things went wrong.  2    I have been anxious or worried for no good reason.  2    I have felt scared or panicky for no good reason.  2    Things have been getting on top of me.  2    I have been so unhappy that I have had difficulty sleeping.  0    I have felt sad or miserable.  1    I have been so unhappy that I have been crying.  1    The thought of harming myself has occurred to me.  0    Edinburgh Postnatal Depression Scale Total  11      No flowsheet data found.  Depression screen [facility] 2/9 01/01/2019  Decreased Interest 0  Down, Depressed, Hopeless 0  PHQ - 2 Score 0    Depression screen PHQ 2/9 01/01/2019  Decreased Interest 0  Down, Depressed, Hopeless 0  PHQ - 2 Score 0     Assessment: 29 y.o. [id number] follow up postpartum depression  Plan: Problem List Items Addressed This Visit    None    Visit Diagnoses    Postpartum depression    -  Primary   Initiation of oral  contraception          1) Postpartum depression - EPDS 11 today, feel symptoms doing well.  Continue Zoloft at current dose  2) Thyroid and B12 screen has not been obtained previously  3) Return in about 4 weeks (around 06/16/2019) for medication follow up.    [name], MD, [facility], [place]

## 2019-06-08 NOTE — Telephone Encounter (Signed)
I called [employer] and spoke with Deb about what information they were needing in regards to the pt and was told there was no more information needed. I have restated that pt is to go back to work on 05/28/19 with her delivering by C-Section on 03/15/19. Deb was unable to connect me with pts caseworker, I have asked that Toniann Fail her caseworker give me a call whenever she has the chance so that her and I may go over this information together to check on everything. I have called pt and told her everything that happened in the conversation I had with deb and that I am waiting on a call from [place]. Pt understanding and very irritated that they seem to be unable to get everything straightened out.

## 2019-06-16 ENCOUNTER — Ambulatory Visit (INDEPENDENT_AMBULATORY_CARE_PROVIDER_SITE_OTHER): Payer: Managed Care, Other (non HMO) | Admitting: Obstetrics and Gynecology

## 2019-06-16 ENCOUNTER — Other Ambulatory Visit: Payer: Self-pay

## 2019-06-16 DIAGNOSIS — O99345 Other mental disorders complicating the puerperium: Secondary | ICD-10-CM

## 2019-06-16 DIAGNOSIS — F53 Postpartum depression: Secondary | ICD-10-CM | POA: Diagnosis not present

## 2019-06-16 MED ORDER — SERTRALINE HCL 100 MG PO TABS: 150.0000 mg | ORAL_TABLET | Freq: Every day | ORAL | 2 refills | Status: DC

## 2019-06-16 NOTE — Progress Notes (Signed)
I connected with Debra Mcdaniel on 06/16/19 at  3:50 PM EST by telephone and verified that I am speaking with the correct person using two identifiers.   I discussed the limitations, risks, security and privacy concerns of performing an evaluation and management service by telephone and the availability of in person appointments. I also discussed with the patient that there may be a patient responsible charge related to this service. The patient expressed understanding and agreed to proceed.  The patient was at work. I spoke with the patient from my workstation phone The names of people involved in this encounter were: Debra Mcdaniel , and [name]   Obstetrics & Gynecology Office Visit   Chief Complaint:  Chief Complaint  Patient presents with  . Follow-up    Postpartum depression    History of Present Illness: The patient is a 29 y.o. female presenting follow up for symptoms of postpartum depression.  The patient is currently taking Zoloft 100mg  po daily for the management of her symptoms.  She has not had any recent situational stressors.  Has returned back to work. She reports symptoms of irritability, social anxiety, agorophobia and feelings of guilt.  She denies anhedonia, insomnia, risk taking behavior, suicidal ideation, homicidal ideation, auditory hallucinations and visual hallucinations. Symptoms have worsened since last visit.     The patient does have a pre-existing history of depression and anxiety.  She  does not a prior history of suicide attempts.   Has had one menstrual cycle in last month.    Review of Systems: Review of Systems  Constitutional: Negative.   Gastrointestinal: Negative for nausea.  Neurological: Negative for headaches.  Psychiatric/Behavioral: Negative for depression, hallucinations, memory loss, substance abuse and suicidal ideas. The patient is nervous/anxious. The patient does not have insomnia.    Past Medical History:  Past Medical  History:  Diagnosis Date  . Anxiety   . BMI 50.0-59.9, adult ([facility])   . Depression   . Headache   . IBS (irritable bowel syndrome)     Past Surgical History:  Past Surgical History:  Procedure Laterality Date  . CESAREAN SECTION    . CESAREAN SECTION N/A 03/15/2019   Procedure: CESAREAN SECTION;  Surgeon: 13/12/2018, MD;  Location: [facility];  Service: Obstetrics;  Laterality: N/A;  . COLONOSCOPY WITH PROPOFOL N/A 07/24/2017   Procedure: COLONOSCOPY WITH PROPOFOL;  Surgeon: Toledo, 07/26/2017, MD;  Location: [facility];  Service: Gastroenterology;  Laterality: N/A;  . ESOPHAGOGASTRODUODENOSCOPY (EGD) WITH PROPOFOL N/A 07/24/2017   Procedure: ESOPHAGOGASTRODUODENOSCOPY (EGD) WITH PROPOFOL;  Surgeon: Toledo, 07/26/2017, MD;  Location: [facility];  Service: Gastroenterology;  Laterality: N/A;  . MIrena   02/2013    Gynecologic History: No LMP recorded.  Obstetric History: 03/2013  Family History:  Family History  Problem Relation Age of Onset  . Hypertension Mother   . Hypothyroidism Mother   . Obesity Mother   . Thyroid disease Mother   . Hypertension Maternal Grandmother   . Hypertension Maternal Grandfather   . Hypertension Paternal Grandmother   . Hypertension Paternal Grandfather   . Vision loss Father   . Breast cancer Neg Hx   . Ovarian cancer Neg Hx     Social History:  Social History   Socioeconomic History  . Marital status: Married    Spouse name: Not on file  . Number of children: Not on file  . Years of education: Not on file  . Highest education level: Not on file  Occupational  History  . Occupation: [employer]  . Smoking status: Never Smoker  . Smokeless tobacco: Never Used  Substance and Sexual Activity  . Alcohol use: Not Currently  . Drug use: No  . Sexual activity: Yes    Birth control/protection: None  Other Topics Concern  . Not on file  Social History Narrative   ** Merged History Encounter **       Social  Determinants of Health   Financial Resource Strain:   . Difficulty of Paying Living Expenses: Not on file  Food Insecurity:   . Worried About Charity fundraiser in the Last Year: Not on file  . Ran Out of Food in the Last Year: Not on file  Transportation Needs:   . Lack of Transportation (Medical): Not on file  . Lack of Transportation (Non-Medical): Not on file  Physical Activity:   . Days of Exercise per Week: Not on file  . Minutes of Exercise per Session: Not on file  Stress:   . Feeling of Stress : Not on file  Social Connections:   . Frequency of Communication with Friends and Family: Not on file  . Frequency of Social Gatherings with Friends and Family: Not on file  . Attends Religious Services: Not on file  . Active Member of Clubs or Organizations: Not on file  . Attends Archivist Meetings: Not on file  . Marital Status: Not on file  Intimate Partner Violence:   . Fear of Current or Ex-Partner: Not on file  . Emotionally Abused: Not on file  . Physically Abused: Not on file  . Sexually Abused: Not on file    Allergies:  Allergies  Allergen Reactions  . Nickel Rash    Medications: Prior to Admission medications   Medication Sig Start Date End Date Taking? Authorizing Provider  norgestimate-ethinyl estradiol (ORTHO-CYCLEN) 0.25-35 MG-MCG tablet Take 1 tablet by mouth daily. 05/19/19   [name], MD  Prenatal Vit-Fe Fumarate-FA (MULTIVITAMIN-PRENATAL) 27-0.8 MG TABS tablet Take 1 tablet by mouth daily at 12 noon.    [provider]  sertraline (ZOLOFT) 100 MG tablet Take 1 tablet (100 mg total) by mouth daily. 05/04/19   [name], MD    Physical Exam Vitals: There were no vitals filed for this visit. No LMP recorded.  No physical exam as this was a remote telephone visit to promote social distancing during the current COVID-19 Pandemic  No flowsheet data found.  Edinburgh Postnatal Depression Scale - 06/16/19 1518       Edinburgh Postnatal Depression Scale:  In the Past 7 Days   I have been able to laugh and see the funny side of things.  0    I have looked forward with enjoyment to things.  0    I have blamed myself unnecessarily when things went wrong.  3    I have been anxious or worried for no good reason.  3    I have felt scared or panicky for no good reason.  2    Things have been getting on top of me.  2    I have been so unhappy that I have had difficulty sleeping.  2    I have felt sad or miserable.  2    I have been so unhappy that I have been crying.  1    The thought of harming myself has occurred to me.  0    Edinburgh Postnatal Depression Scale Total  15      Assessment: 29 y.o. G2P2002 follow up postpartum depression  Plan: Problem List Items Addressed This Visit    None    Visit Diagnoses    Postpartum depression    -  Primary   Relevant Medications   sertraline (ZOLOFT) 100 MG tablet      1) Increase Zoloft to 150mg  po daily  2) Thyroid and B12 screen has not been obtained previously  3) Telephone Time 15:00 minutes  4) Return in about 4 weeks (around 07/14/2019) for medication follow up.    09/13/2019, MD, [name] [facility], [facility] 06/16/2019, 3:38 PM

## 2019-07-14 ENCOUNTER — Other Ambulatory Visit: Payer: Self-pay

## 2019-07-14 ENCOUNTER — Telehealth (INDEPENDENT_AMBULATORY_CARE_PROVIDER_SITE_OTHER): Payer: Managed Care, Other (non HMO) | Admitting: Obstetrics and Gynecology

## 2019-07-14 DIAGNOSIS — F53 Postpartum depression: Secondary | ICD-10-CM

## 2019-07-14 DIAGNOSIS — O99345 Other mental disorders complicating the puerperium: Secondary | ICD-10-CM | POA: Diagnosis not present

## 2019-07-14 MED ORDER — BUPROPION HCL ER (XL) 150 MG PO TB24: 150.0000 mg | ORAL_TABLET | Freq: Every day | ORAL | 2 refills | Status: DC

## 2019-07-14 NOTE — Progress Notes (Signed)
Obstetrics & Gynecology Office Visit (Video)   Chief Complaint: No chief complaint on file.   History of Present Illness: The patient is a 29 y.o. female presenting follow up for symptoms of postpartum depression.  The patient is currently taking Zoloft 150mg  for the management of her symptoms.  She has not had any recent situational stressors.  She reports symptoms of anhedonia, insomnia, irritability, social anxiety, feelings of guilt and feelings of worthlessness.  She denies risk taking behavior, agorophobia, suicidal ideation, homicidal ideation, auditory hallucinations and visual hallucinations. Symptoms have remained unchanged since last visit.      Review of Systems: Review of systems negative unless noted in HPI  Past Medical History:  Past Medical History:  Diagnosis Date  . Anxiety   . BMI 50.0-59.9, adult ([place])   . Depression   . Headache   . IBS (irritable bowel syndrome)     Past Surgical History:  Past Surgical History:  Procedure Laterality Date  . CESAREAN SECTION    . CESAREAN SECTION N/A 03/15/2019   Procedure: CESAREAN SECTION;  Surgeon: Gae Dry, MD;  Location: [facility];  Service: Obstetrics;  Laterality: N/A;  . COLONOSCOPY WITH PROPOFOL N/A 07/24/2017   Procedure: COLONOSCOPY WITH PROPOFOL;  Surgeon: Toledo, Benay Pike, MD;  Location: [facility];  Service: Gastroenterology;  Laterality: N/A;  . ESOPHAGOGASTRODUODENOSCOPY (EGD) WITH PROPOFOL N/A 07/24/2017   Procedure: ESOPHAGOGASTRODUODENOSCOPY (EGD) WITH PROPOFOL;  Surgeon: Toledo, Benay Pike, MD;  Location: [facility];  Service: Gastroenterology;  Laterality: N/A;  . MIrena   02/2013    Gynecologic History: No LMP recorded.  Obstetric History: [id number]  Family History:  Family History  Problem Relation Age of Onset  . Hypertension Mother   . Hypothyroidism Mother   . Obesity Mother   . Thyroid disease Mother   . Hypertension Maternal Grandmother   . Hypertension Maternal  Grandfather   . Hypertension Paternal Grandmother   . Hypertension Paternal Grandfather   . Vision loss Father   . Breast cancer Neg Hx   . Ovarian cancer Neg Hx     Social History:  Social History   Socioeconomic History  . Marital status: Married    Spouse name: Not on file  . Number of children: Not on file  . Years of education: Not on file  . Highest education level: Not on file  Occupational History  . Occupation: [employer]  . Smoking status: Never Smoker  . Smokeless tobacco: Never Used  Substance and Sexual Activity  . Alcohol use: Not Currently  . Drug use: No  . Sexual activity: Yes    Birth control/protection: None  Other Topics Concern  . Not on file  Social History Narrative   ** Merged History Encounter **       Social Determinants of Health   Financial Resource Strain:   . Difficulty of Paying Living Expenses:   Food Insecurity:   . Worried About Charity fundraiser in the Last Year:   . Arboriculturist in the Last Year:   Transportation Needs:   . Film/video editor (Medical):   Marland Kitchen Lack of Transportation (Non-Medical):   Physical Activity:   . Days of Exercise per Week:   . Minutes of Exercise per Session:   Stress:   . Feeling of Stress :   Social Connections:   . Frequency of Communication with Friends and Family:   . Frequency of Social Gatherings with Friends and  Family:   . Attends Religious Services:   . Active Member of Clubs or Organizations:   . Attends Banker Meetings:   Marland Kitchen Marital Status:   Intimate Partner Violence:   . Fear of Current or Ex-Partner:   . Emotionally Abused:   Marland Kitchen Physically Abused:   . Sexually Abused:     Allergies:  Allergies  Allergen Reactions  . Nickel Rash    Medications: Prior to Admission medications   Medication Sig Start Date End Date Taking? Authorizing Provider  norgestimate-ethinyl estradiol (ORTHO-CYCLEN) 0.25-35 MG-MCG tablet Take 1 tablet by mouth daily.  05/19/19   [name], MD  Prenatal Vit-Fe Fumarate-FA (MULTIVITAMIN-PRENATAL) 27-0.8 MG TABS tablet Take 1 tablet by mouth daily at 12 noon.    [provider]  sertraline (ZOLOFT) 100 MG tablet Take 1.5 tablets (150 mg total) by mouth daily. 06/16/19   [name], MD    Physical Exam Vitals: There were no vitals filed for this visit. No LMP recorded.  Marland Kitchenams No flowsheet data found.  Depression screen [facility] 2/9 01/01/2019  Decreased Interest 0  Down, Depressed, Hopeless 0  PHQ - 2 Score 0    Depression screen PHQ 2/9 01/01/2019  Decreased Interest 0  Down, Depressed, Hopeless 0  PHQ - 2 Score 0     Assessment: 29 y.o. [id number] follow up postpartum depression  Plan: Problem List Items Addressed This Visit    None    Visit Diagnoses    Postpartum depression    -  Primary   Relevant Medications   buPROPion (WELLBUTRIN XL) 150 MG 24 hr tablet      1) Edinburgh 14 has started seeing a psychologist as well which she finds helpful.  Given no improvement with Zoloft dose increase will trial on Wellbutrin Xl 150mg   2) Thyroid and B12 screen has not been obtained previously  3) Telephone 10:15 minutes  4) Return in about 2 weeks (around 07/28/2019) for medication .    07/30/2019, MD, [name] [facility], [facility] 07/14/2019, 3:58 PM

## 2019-07-15 ENCOUNTER — Telehealth: Payer: Self-pay | Admitting: Obstetrics and Gynecology

## 2019-07-24 NOTE — Telephone Encounter (Signed)
error 

## 2019-07-31 ENCOUNTER — Encounter: Payer: Self-pay | Admitting: Obstetrics and Gynecology

## 2019-07-31 ENCOUNTER — Ambulatory Visit: Payer: Managed Care, Other (non HMO) | Attending: Internal Medicine

## 2019-07-31 ENCOUNTER — Other Ambulatory Visit: Payer: Self-pay

## 2019-07-31 ENCOUNTER — Ambulatory Visit (INDEPENDENT_AMBULATORY_CARE_PROVIDER_SITE_OTHER): Payer: Managed Care, Other (non HMO) | Admitting: Obstetrics and Gynecology

## 2019-07-31 DIAGNOSIS — F53 Postpartum depression: Secondary | ICD-10-CM | POA: Diagnosis not present

## 2019-07-31 DIAGNOSIS — K219 Gastro-esophageal reflux disease without esophagitis: Secondary | ICD-10-CM

## 2019-07-31 DIAGNOSIS — O99345 Other mental disorders complicating the puerperium: Secondary | ICD-10-CM

## 2019-07-31 DIAGNOSIS — E669 Obesity, unspecified: Secondary | ICD-10-CM

## 2019-07-31 DIAGNOSIS — Z6841 Body Mass Index (BMI) 40.0 and over, adult: Secondary | ICD-10-CM

## 2019-07-31 MED ORDER — OMEPRAZOLE 20 MG PO CPDR: 20.0000 mg | DELAYED_RELEASE_CAPSULE | Freq: Every day | ORAL | 11 refills | Status: DC

## 2019-07-31 MED ORDER — BUPROPION HCL ER (XL) 300 MG PO TB24: 300.0000 mg | ORAL_TABLET | Freq: Every day | ORAL | 3 refills | Status: DC

## 2019-07-31 NOTE — Progress Notes (Signed)
I connected with Debra Mcdaniel on 08/04/19 at  4:10 PM EDT by telephone and verified that I am speaking with the correct person using two identifiers.   I discussed the limitations, risks, security and privacy concerns of performing an evaluation and management service by telephone and the availability of in person appointments. I also discussed with the patient that there may be a patient responsible charge related to this service. The patient expressed understanding and agreed to proceed.  The patient was at home I spoke with the patient from my workstation phone The names of people involved in this encounter were: Debra Mcdaniel , and [name]   Obstetrics & Gynecology Office Visit   Chief Complaint:  Chief Complaint  Patient presents with  . Follow-up    pp depression    History of Present Illness: The patient is a 29 y.o. female presenting follow up for symptoms of postpartum depression.  The patient is currently taking Wellbutrin XL 300mg  for the management of her symptoms.  She has had a recent situational stressors with her husband just testing positive for COVID.  She reports good improvement in her symptoms with switch to Wellbutrin..  She denies risk taking behavior, irritability, feelings of guilt, feelings of worthlessness and visual hallucinations. Symptoms have improved since last visit.     The patient does not have a pre-existing history of depression and anxiety.  She  does not a prior history of suicide attempts.    Review of Systems: review of systems negative unless otherwise noted in HPI  Past Medical History:  Past Medical History:  Diagnosis Date  . Anxiety   . BMI 50.0-59.9, adult (HCC)   . Depression   . Headache   . IBS (irritable bowel syndrome)     Past Surgical History:  Past Surgical History:  Procedure Laterality Date  . CESAREAN SECTION    . CESAREAN SECTION N/A 03/15/2019   Procedure: CESAREAN SECTION;  Surgeon: 13/12/2018,  MD;  Location: [facility];  Service: Obstetrics;  Laterality: N/A;  . COLONOSCOPY WITH PROPOFOL N/A 07/24/2017   Procedure: COLONOSCOPY WITH PROPOFOL;  Surgeon: Toledo, 07/26/2017, MD;  Location: [facility];  Service: Gastroenterology;  Laterality: N/A;  . ESOPHAGOGASTRODUODENOSCOPY (EGD) WITH PROPOFOL N/A 07/24/2017   Procedure: ESOPHAGOGASTRODUODENOSCOPY (EGD) WITH PROPOFOL;  Surgeon: Toledo, 07/26/2017, MD;  Location: [facility];  Service: Gastroenterology;  Laterality: N/A;  . MIrena   02/2013    Gynecologic History: Patient's last menstrual period was 07/01/2019.  Obstetric History: 07/03/2019  Family History:  Family History  Problem Relation Age of Onset  . Hypertension Mother   . Hypothyroidism Mother   . Obesity Mother   . Thyroid disease Mother   . Hypertension Maternal Grandmother   . Hypertension Maternal Grandfather   . Hypertension Paternal Grandmother   . Hypertension Paternal Grandfather   . Vision loss Father   . Breast cancer Neg Hx   . Ovarian cancer Neg Hx     Social History:  Social History   Socioeconomic History  . Marital status: Married    Spouse name: Not on file  . Number of children: Not on file  . Years of education: Not on file  . Highest education level: Not on file  Occupational History  . Occupation: [id number]  . Smoking status: Never Smoker  . Smokeless tobacco: Never Used  Substance and Sexual Activity  . Alcohol use: Not Currently  . Drug use: No  . Sexual  activity: Yes    Birth control/protection: None  Other Topics Concern  . Not on file  Social History Narrative   ** Merged History Encounter **       Social Determinants of Health   Financial Resource Strain:   . Difficulty of Paying Living Expenses:   Food Insecurity:   . Worried About Programme researcher, broadcasting/film/video in the Last Year:   . Barista in the Last Year:   Transportation Needs:   . Freight forwarder (Medical):   Marland Kitchen Lack of Transportation  (Non-Medical):   Physical Activity:   . Days of Exercise per Week:   . Minutes of Exercise per Session:   Stress:   . Feeling of Stress :   Social Connections:   . Frequency of Communication with Friends and Family:   . Frequency of Social Gatherings with Friends and Family:   . Attends Religious Services:   . Active Member of Clubs or Organizations:   . Attends Banker Meetings:   Marland Kitchen Marital Status:   Intimate Partner Violence:   . Fear of Current or Ex-Partner:   . Emotionally Abused:   Marland Kitchen Physically Abused:   . Sexually Abused:     Allergies:  Allergies  Allergen Reactions  . Nickel Rash    Medications: Prior to Admission medications   Medication Sig Start Date End Date Taking? Authorizing Provider  buPROPion (WELLBUTRIN XL) 150 MG 24 hr tablet Take 1 tablet (150 mg total) by mouth daily. 07/14/19  Yes [name], MD  Prenatal Vit-Fe Fumarate-FA (MULTIVITAMIN-PRENATAL) 27-0.8 MG TABS tablet Take 1 tablet by mouth daily at 12 noon.   Yes [provider]  norgestimate-ethinyl estradiol (ORTHO-CYCLEN) 0.25-35 MG-MCG tablet Take 1 tablet by mouth daily. Patient not taking: Reported on 07/31/2019 05/19/19   [name], MD    Physical Exam Vitals: There were no vitals filed for this visit. Patient's last menstrual period was 07/01/2019.  No physical exam as this was a remote telephone visit to promote social distancing during the current COVID-19 Pandemic  Edinburgh Postnatal Depression Scale - 07/31/19 1602      Edinburgh Postnatal Depression Scale:  In the Past 7 Days   I have been able to laugh and see the funny side of things.  0    I have looked forward with enjoyment to things.  1    I have blamed myself unnecessarily when things went wrong.  3    I have been anxious or worried for no good reason.  3    I have felt scared or panicky for no good reason.  2    Things have been getting on top of me.  0    I have been so unhappy that I  have had difficulty sleeping.  2    I have felt sad or miserable.  3    I have been so unhappy that I have been crying.  2    The thought of harming myself has occurred to me.  0    Edinburgh Postnatal Depression Scale Total  16       No flowsheet data found.  Depression screen [facility] 2/9 01/01/2019  Decreased Interest 0  Down, Depressed, Hopeless 0  PHQ - 2 Score 0    Depression screen PHQ 2/9 01/01/2019  Decreased Interest 0  Down, Depressed, Hopeless 0  PHQ - 2 Score 0     Assessment: 29 y.o. [id number] follow up postpartum depression  Plan:  Problem List Items Addressed This Visit      Other   BMI 50.0-59.9, adult ([place])    Other Visit Diagnoses    Gastroesophageal reflux disease without esophagitis    -  Primary   Relevant Medications   omeprazole (PRILOSEC) 20 MG capsule   Postpartum depression       Relevant Medications   buPROPion (WELLBUTRIN XL) 300 MG 24 hr tablet      1) Increase Wellbutrin to 300mg .  Acute stressor with husband testing positive for COVID but overall reports symptom improvement on Wellbutrin.  2) Thyroid and B12 screen has not been obtained previously  3) Telephone Time 12:69min  4)  Return in about 4 weeks (around 08/28/2019) for medication follow up phone.    [name], MD, [facility], [facility] 07/31/2019, 4:33 PM

## 2019-08-11 NOTE — Telephone Encounter (Signed)
Please message pt

## 2019-08-25 NOTE — Telephone Encounter (Signed)
Patient is schedule for 09/08/19 with AMS

## 2019-08-25 NOTE — Telephone Encounter (Signed)
Needs NOB visit set up secondary to positive pregnancy test

## 2019-09-08 ENCOUNTER — Ambulatory Visit (INDEPENDENT_AMBULATORY_CARE_PROVIDER_SITE_OTHER): Payer: Managed Care, Other (non HMO) | Admitting: Obstetrics and Gynecology

## 2019-09-08 ENCOUNTER — Encounter: Payer: Self-pay | Admitting: Obstetrics and Gynecology

## 2019-09-08 ENCOUNTER — Other Ambulatory Visit: Payer: Self-pay

## 2019-09-08 ENCOUNTER — Other Ambulatory Visit (HOSPITAL_COMMUNITY)
Admission: RE | Admit: 2019-09-08 | Discharge: 2019-09-08 | Disposition: A | Discharge status: Home / Self Care | Payer: Managed Care, Other (non HMO) | Source: Ambulatory Visit | Attending: Obstetrics and Gynecology | Admitting: Obstetrics and Gynecology

## 2019-09-08 VITALS — BP 134/70 | Wt 319.0 lb

## 2019-09-08 DIAGNOSIS — O0990 Supervision of high risk pregnancy, unspecified, unspecified trimester: Secondary | ICD-10-CM | POA: Insufficient documentation

## 2019-09-08 DIAGNOSIS — R0989 Other specified symptoms and signs involving the circulatory and respiratory systems: Secondary | ICD-10-CM

## 2019-09-08 DIAGNOSIS — N912 Amenorrhea, unspecified: Secondary | ICD-10-CM

## 2019-09-08 DIAGNOSIS — O99210 Obesity complicating pregnancy, unspecified trimester: Secondary | ICD-10-CM

## 2019-09-08 DIAGNOSIS — O09899 Supervision of other high risk pregnancies, unspecified trimester: Secondary | ICD-10-CM

## 2019-09-08 DIAGNOSIS — O34219 Maternal care for unspecified type scar from previous cesarean delivery: Secondary | ICD-10-CM

## 2019-09-08 DIAGNOSIS — Z3689 Encounter for other specified antenatal screening: Secondary | ICD-10-CM

## 2019-09-08 DIAGNOSIS — Z3A09 9 weeks gestation of pregnancy: Secondary | ICD-10-CM

## 2019-09-08 LAB — POCT URINE PREGNANCY: Preg Test, Ur: POSITIVE — AB

## 2019-09-08 NOTE — Progress Notes (Signed)
NOB 

## 2019-09-08 NOTE — Progress Notes (Signed)
New Obstetric Patient H&P    Chief Complaint: "Desires prenatal care"   History of Present Illness: Patient is a 29 y.o. [id number] Not Hispanic or Latino female, presents with amenorrhea and positive home pregnancy test. Patient's last menstrual period was 07/01/2019 (exact date). and based on her  LMP, her EDD is Estimated Date of Delivery: 04/06/20 and her EGA is [redacted]w[redacted]d.  Her last pap smear was on 01/08/2018 NIL.   Her last menstrual period was normal. Since her LMP she claims she has experienced fatigue, mild nausea. She denies vaginal bleeding. Her past medical history is contibutory labile blood pressure without a formal diagnosis of HTN and obesity Body mass index is 58.35 kg/m.Marland Kitchen Her prior pregnancies are notable for short interval pregnancy, history of cesarean section 03/15/2019.  Since her LMP, she admits to the use of tobacco products  no There are cats in the home in the home  no  She admits close contact with children on a regular basis  yes  She has had chicken pox in the past yes She has had Tuberculosis exposures, symptoms, or previously tested positive for TB   no Current or past history of domestic violence. no  Genetic Screening/Teratology Counseling: (Includes patient, baby's father, or anyone in either family with:)   1. Patient's age >/= 22 at [facility]  no 2. Thalassemia ([place], [place], Mediterranean, or Asian background): MCV<80  no 3. Neural tube defect (meningomyelocele, spina bifida, anencephaly)  no 4. Congenital heart defect  no  5. Down syndrome  no 6. Tay-Sachs (Jewish, [place]  no 7. Canavan's Disease  no 8. Sickle cell disease or trait (African)  no  9. Hemophilia or other blood disorders  no  10. Muscular dystrophy  no  11. Cystic fibrosis  no  12. Huntington's Chorea  no  13. Mental retardation/autism  no 14. Other inherited genetic or chromosomal disorder  no 15. Maternal metabolic disorder (DM, PKU, etc)  no 16. Patient or FOB with a child  with a birth defect not listed above no  16a. Patient or FOB with a birth defect themselves no 17. Recurrent pregnancy loss, or stillbirth  no  18. Any medications since LMP other than prenatal vitamins (include vitamins, supplements, OTC meds, drugs, alcohol)  no 19. Any other genetic/environmental exposure to discuss  no  Infection History:   1. Lives with someone with TB or TB exposed  no  2. Patient or partner has history of genital herpes  no 3. Rash or viral illness since LMP  no 4. History of STI (GC, CT, HPV, syphilis, HIV)  no 5. History of recent travel :  no  Other pertinent information:  no     Review of Systems:10 point review of systems negative unless otherwise noted in HPI  Past Medical History:  Patient Active Problem List   Diagnosis Date Noted  . Uterine scar from previous cesarean delivery affecting pregnancy 03/15/2019  . Labile blood pressure 12/04/2018    Pt states white coat , fluctuates when she comes for visits Frequently will have systolic BP in 140 range    . Obesity affecting pregnancy, antepartum 09/03/2018    BMI 59  Normal early glucola     . Supervision of high-risk pregnancy 08/29/2018    Clinic [facility] Prenatal Labs  Dating LMP = 12 week [place] Blood type: O negative  Genetic Screen NIPS: Normal XY Inheritest: negative Antibody:Negative  Anatomic [place]  Rubella:Immune Varicella: Immune  GTT Early:89 Third trimester:137 RPR: NR  Rhogam 01/09/19 HBsAg: negative  TDaP vaccine                       Flu Shot: HIV: negative  Baby Food Bottle                            IRS:[id number]  Contraception Undecided Pap:01/08/2018 NIL  Hgb  AA   CS/VBAC Repeat C-section 11/10   Support Person           . BMI 50.0-59.9, adult (HCC) 08/29/2018    BMI >=40 [ ]  early 1h gtt -  [ ]  u/s for dating [ ]   [ ]  nutritional goals [ ]  folic acid 1mg  [ ]  bASA (>12 weeks) [ ]  consider nutrition consult [ ]  consider maternal EKG 1st trimester [ ]  Growth u/s 28 [ ] ,  32 [ ] , 36 weeks [ ]  [ ]  NST/AFI weekly 36+ weeks (36[] , 37[] , 38[] , 39[] , 40[] ) [ ]  IOL by 41 weeks (scheduled, prn [] )   . History of cesarean delivery, currently pregnant 08/29/2018    2014 LTCS via pfannenstiel in 2014 at [facility] for FTP    . GAD (generalized anxiety disorder) 11/15/2017    Patient has difficulty getting over things impacts her daily life affects her GI disturbances and her HA s Offered Zoloft    . Headache disorder 11/15/2017    Offered referral to neurology  Reports normal ophthalmology exam annually    . Intractable chronic migraine without aura and without status migrainosus 09/04/2017  . Irritable bowel syndrome with diarrhea 05/17/2017    Negative upper GI and colonoscopy in 2019    . Rh negative status during pregnancy 08/31/2012    Overview:  O neg - needs RhoGam     Past Surgical History:  Past Surgical History:  Procedure Laterality Date  . CESAREAN SECTION    . CESAREAN SECTION N/A 03/15/2019   Procedure: CESAREAN SECTION;  Surgeon: , MD;  Location: [facility] ORS;  Service: Obstetrics;  Laterality: N/A;  . COLONOSCOPY WITH PROPOFOL N/A 07/24/2017   Procedure: COLONOSCOPY WITH PROPOFOL;  Surgeon: Toledo, , MD;  Location: [facility] ENDOSCOPY;  Service: Gastroenterology;  Laterality: N/A;  . ESOPHAGOGASTRODUODENOSCOPY (EGD) WITH PROPOFOL N/A 07/24/2017   Procedure: ESOPHAGOGASTRODUODENOSCOPY (EGD) WITH PROPOFOL;  Surgeon: Toledo, , MD;  Location: [facility] ENDOSCOPY;  Service: Gastroenterology;  Laterality: N/A;  . MIrena   02/2013    Gynecologic History: Patient's last menstrual period was 07/01/2019 (exact date).  Obstetric History: 2015  Family History:  Family History  Problem Relation Age of Onset  . Hypertension Mother   . Hypothyroidism Mother   . Obesity Mother   . Thyroid disease Mother   . Hypertension Maternal Grandmother   . Hypertension Maternal Grandfather   . Hypertension Paternal Grandmother   .  Hypertension Paternal Grandfather   . Vision loss Father   . Breast cancer Neg Hx   . Ovarian cancer Neg Hx     Social History:  Social History   Socioeconomic History  . Marital status: Married    Spouse name: Not on file  . Number of children: Not on file  . Years of education: Not on file  . Highest education level: Not on file  Occupational History  . Occupation: [employer]  . Smoking status: Never Smoker  . Smokeless tobacco: Never Used  Substance and Sexual Activity  . Alcohol use: Not Currently  .  Drug use: No  . Sexual activity: Yes    Birth control/protection: None  Other Topics Concern  . Not on file  Social History Narrative   ** Merged History Encounter **       Social Determinants of Health   Financial Resource Strain:   . Difficulty of Paying Living Expenses:   Food Insecurity:   . Worried About Charity fundraiser in the Last Year:   . Arboriculturist in the Last Year:   Transportation Needs:   . Film/video editor (Medical):   Marland Kitchen Lack of Transportation (Non-Medical):   Physical Activity:   . Days of Exercise per Week:   . Minutes of Exercise per Session:   Stress:   . Feeling of Stress :   Social Connections:   . Frequency of Communication with Friends and Family:   . Frequency of Social Gatherings with Friends and Family:   . Attends Religious Services:   . Active Member of Clubs or Organizations:   . Attends Archivist Meetings:   Marland Kitchen Marital Status:   Intimate Partner Violence:   . Fear of Current or Ex-Partner:   . Emotionally Abused:   Marland Kitchen Physically Abused:   . Sexually Abused:     Allergies:  Allergies  Allergen Reactions  . Nickel Rash    Medications: Prior to Admission medications   Medication Sig Start Date End Date Taking? Authorizing Provider  buPROPion (WELLBUTRIN XL) 300 MG 24 hr tablet Take 1 tablet (300 mg total) by mouth daily. 07/31/19  Yes [name], MD    Physical Exam Vitals: Blood  pressure 134/70, weight (!) 319 lb (144.7 kg), last menstrual period 07/01/2019, not currently breastfeeding.  General: NAD HEENT: normocephalic, anicteric Pulmonary: No increased work of breathing, CTAB Abdomen: soft, non-tender, non-distended.  Umbilicus without lesions.  No hepatomegaly, splenomegaly or masses palpable. No evidence of hernia  Genitourinary:  External: Normal external female genitalia.  Normal urethral meatus, normal  Bartholin's and Skene's glands.    Vagina: Normal vaginal mucosa, no evidence of prolapse.    Cervix: Grossly normal in appearance, no bleeding  Uterus:  Non-enlarged, mobile, normal contour.  No CMT  Adnexa: ovaries non-enlarged, no adnexal masses  Rectal: deferred Extremities: no edema, erythema, or tenderness Neurologic: Grossly intact Psychiatric: mood appropriate, affect full   Assessment: 29 y.o. G3P2002 at [redacted]w[redacted]d presenting to initiate prenatal care  Plan: 1) Avoid alcoholic beverages. 2) Patient encouraged not to smoke.  3) Discontinue the use of all non-medicinal drugs and chemicals.  4) Take prenatal vitamins daily.  5) Nutrition, food safety (fish, cheese advisories, and high nitrite foods) and exercise discussed. 6) Hospital and practice style discussed with cross coverage system.  7) Genetic Screening, such as with 1st Trimester Screening, cell free fetal DNA, AFP testing, and Ultrasound, as well as with amniocentesis and CVS as appropriate, is discussed with patient. At the conclusion of today's visit patient requested genetic testing 8) Dating scan ordered [name], MD, [place], [place] 09/08/2019, 3:25 PM

## 2019-09-09 LAB — RPR+RH+ABO+RUB AB+AB SCR+CB...
Antibody Screen: NEGATIVE
HIV Screen 4th Generation wRfx: NONREACTIVE
Hematocrit: 37.1 % (ref 34.0–46.6)
Hemoglobin: 11.9 g/dL (ref 11.1–15.9)
Hepatitis B Surface Ag: NEGATIVE
MCH: 29.2 pg (ref 26.6–33.0)
MCHC: 32.1 g/dL (ref 31.5–35.7)
MCV: 91 fL (ref 79–97)
Platelets: 427 10*3/uL (ref 150–450)
RBC: 4.07 x10E6/uL (ref 3.77–5.28)
RDW: 13.9 % (ref 11.7–15.4)
RPR Ser Ql: NONREACTIVE
Rh Factor: NEGATIVE
Rubella Antibodies, IGG: 3.13 index (ref >0.99)
Varicella zoster IgG: 291 index (ref >165)
WBC: 9.3 10*3/uL (ref 3.4–10.8)

## 2019-09-09 LAB — COMPREHENSIVE METABOLIC PANEL WITH GFR
ALT: 15 IU/L (ref 0–32)
AST: 14 IU/L (ref 0–40)
Albumin/Globulin Ratio: 1.6 (ref 1.2–2.2)
Albumin: 3.8 g/dL — ABNORMAL LOW (ref 3.9–5.0)
Alkaline Phosphatase: 97 IU/L (ref 39–117)
BUN/Creatinine Ratio: 13 (ref 9–23)
BUN: 11 mg/dL (ref 6–20)
Bilirubin Total: <0.2 mg/dL (ref 0.0–1.2)
CO2: 22 mmol/L (ref 20–29)
Calcium: 8.9 mg/dL (ref 8.7–10.2)
Chloride: 106 mmol/L (ref 96–106)
Creatinine, Ser: 0.82 mg/dL (ref 0.57–1.00)
GFR calc Af Amer: 113 mL/min/1.73
GFR calc non Af Amer: 98 mL/min/1.73
Globulin, Total: 2.4 g/dL (ref 1.5–4.5)
Glucose: 80 mg/dL (ref 65–99)
Potassium: 4.7 mmol/L (ref 3.5–5.2)
Sodium: 141 mmol/L (ref 134–144)
Total Protein: 6.2 g/dL (ref 6.0–8.5)

## 2019-09-09 LAB — PROTEIN / CREATININE RATIO, URINE
Creatinine, Urine: 181.1 mg/dL
Protein, Ur: 7.5 mg/dL
Protein/Creat Ratio: 41 mg/g{creat} (ref 0–200)

## 2019-09-09 LAB — HEMOGLOBIN A1C
Est. average glucose Bld gHb Est-mCnc: 111 mg/dL
Hgb A1c MFr Bld: 5.5 % (ref 4.8–5.6)

## 2019-09-10 LAB — URINE CULTURE

## 2019-09-10 LAB — CERVICOVAGINAL ANCILLARY ONLY
Chlamydia: NEGATIVE
Comment: NEGATIVE
Comment: NORMAL
Neisseria Gonorrhea: NEGATIVE

## 2019-09-18 ENCOUNTER — Ambulatory Visit (INDEPENDENT_AMBULATORY_CARE_PROVIDER_SITE_OTHER): Payer: Managed Care, Other (non HMO) | Admitting: Obstetrics and Gynecology

## 2019-09-18 ENCOUNTER — Other Ambulatory Visit: Payer: Self-pay | Admitting: Obstetrics and Gynecology

## 2019-09-18 ENCOUNTER — Other Ambulatory Visit: Payer: Self-pay

## 2019-09-18 ENCOUNTER — Ambulatory Visit: Payer: Managed Care, Other (non HMO)

## 2019-09-18 VITALS — BP 118/72 | Wt 317.0 lb

## 2019-09-18 DIAGNOSIS — Z3689 Encounter for other specified antenatal screening: Secondary | ICD-10-CM

## 2019-09-18 DIAGNOSIS — O99210 Obesity complicating pregnancy, unspecified trimester: Secondary | ICD-10-CM

## 2019-09-18 DIAGNOSIS — O0990 Supervision of high risk pregnancy, unspecified, unspecified trimester: Secondary | ICD-10-CM

## 2019-09-18 DIAGNOSIS — O34219 Maternal care for unspecified type scar from previous cesarean delivery: Secondary | ICD-10-CM

## 2019-09-18 DIAGNOSIS — R0989 Other specified symptoms and signs involving the circulatory and respiratory systems: Secondary | ICD-10-CM

## 2019-09-18 DIAGNOSIS — Z6791 Unspecified blood type, Rh negative: Secondary | ICD-10-CM

## 2019-09-18 DIAGNOSIS — O09899 Supervision of other high risk pregnancies, unspecified trimester: Secondary | ICD-10-CM

## 2019-09-18 DIAGNOSIS — O26892 Other specified pregnancy related conditions, second trimester: Secondary | ICD-10-CM

## 2019-09-18 DIAGNOSIS — Z1379 Encounter for other screening for genetic and chromosomal anomalies: Secondary | ICD-10-CM

## 2019-09-18 DIAGNOSIS — Z3A11 11 weeks gestation of pregnancy: Secondary | ICD-10-CM

## 2019-09-18 NOTE — Progress Notes (Signed)
Routine Prenatal Care Visit  Subjective  Debra Mcdaniel is a 29 y.o. G3P2002 at [redacted]w[redacted]d being seen today for ongoing prenatal care.  She is currently monitored for the following issues for this low-risk pregnancy and has GAD (generalized anxiety disorder); Headache disorder; Rh negative status during pregnancy; Supervision of high-risk pregnancy; BMI 50.0-59.9, adult ([place]); History of cesarean delivery, currently pregnant; Intractable chronic migraine without aura and without status migrainosus; Irritable bowel syndrome with diarrhea; Obesity affecting pregnancy, antepartum; Labile blood pressure; Uterine scar from previous cesarean delivery affecting pregnancy; and Short interval between pregnancies affecting pregnancy, antepartum on their problem list.  ----------------------------------------------------------------------------------- Patient reports no complaints.    .  .   . Denies leaking of fluid.  ----------------------------------------------------------------------------------- The following portions of the patient's history were reviewed and updated as appropriate: allergies, current medications, past family history, past medical history, past social history, past surgical history and problem list. Problem list updated.   Objective  Blood pressure 118/72, weight (!) 317 lb (143.8 kg), last menstrual period 07/01/2019, not currently breastfeeding. Pregravid weight 307 lb (139.3 kg) Total Weight Gain 10 lb (4.536 kg) Urinalysis:      Fetal Status:           General:  Alert, oriented and cooperative. Patient is in no acute distress.  Skin: Skin is warm and dry. No rash noted.   Cardiovascular: Normal heart rate noted  Respiratory: Normal respiratory effort, no problems with respiration noted  Abdomen: Soft, gravid, appropriate for gestational age.       Pelvic:  Cervical exam deferred        Extremities: Normal range of motion.     ental Status: Normal mood and affect. Normal  behavior. Normal judgment and thought content.   US OB LESS THAN 14 WEEKS WITH OB TRANSVAGINAL  Result Date: 09/18/2019 Patient Name: Debra Mcdaniel DOB: Jan 11, 1991 MRN: [id number] ULTRASOUND REPORT Location: [facility] Date of Service: 09/18/2019 Indications:dating Findings: Nelda Marse[place] intrauterine pregnancy is visualized with a CRL consistent with [redacted]w[redacted]d gestation, giving an (U/S) EDD of 04/08/2020. The (U/S) EDD is consistent with the clinically established EDD of 04/06/2020. FHR: 168 BPM CRL measurement: 41.2 mm Yolk sac is visualized and appears normal. Amnion: visualized and appears normal Right Ovary is normal in appearance. Left Ovary is normal appearance. Corpus luteal cyst:  Right ovary Survey of the adnexa demonstrates no adnexal masses. There is no free peritoneal fluid in the cul de sac. Impression: 1. [redacted]w[redacted]d Viable Singleton Intrauterine pregnancy. 2. (U/S) EDD is consistent with Clinically established EDD of 04/06/2020. Recommendations: 1.Clinical correlation with the patient's History and Physical Exam. [name], RT There is a viable singleton gestation.  The fetal biometry correlates with established dating. Detailed evaluation of the fetal anatomy is precluded by early gestational age.  It must be noted that a normal ultrasound particular at this early gestational age is unable to rule out fetal aneuploidy, risk of first trimester miscarriage, or anatomic birth defects. [name], MD, [facility], [facility] 09/18/2019, 4:30 PM     Assessment   29 y.o. [id number] at [redacted]w[redacted]d by  04/06/2020, by Last Menstrual Period presenting for routine prenatal visit  Plan    Gestational age appropriate obstetric precautions including but not limited to vaginal bleeding, contractions, leaking of fluid and fetal movement were reviewed in detail with the patient.    - to late for labs today return next week  Return for sometime next week for MaterniT21 labs, 4  week  ROBVena Austria, MD, [facility], [facility] 09/18/2019, 4:31 PM

## 2019-09-18 NOTE — Progress Notes (Signed)
ROB N&V Dating scan today

## 2019-09-21 ENCOUNTER — Other Ambulatory Visit: Payer: Self-pay | Admitting: Obstetrics and Gynecology

## 2019-09-21 ENCOUNTER — Other Ambulatory Visit: Payer: Managed Care, Other (non HMO)

## 2019-09-21 ENCOUNTER — Other Ambulatory Visit: Payer: Self-pay

## 2019-09-21 DIAGNOSIS — Z1379 Encounter for other screening for genetic and chromosomal anomalies: Secondary | ICD-10-CM

## 2019-09-25 LAB — MATERNIT21 PLUS CORE+SCA
Fetal Fraction: 7
Monosomy X (Turner Syndrome): NOT DETECTED
Result (T21): NEGATIVE
Trisomy 13 (Patau syndrome): NEGATIVE
Trisomy 18 (Edwards syndrome): NEGATIVE
Trisomy 21 (Down syndrome): NEGATIVE
XXX (Triple X Syndrome): NOT DETECTED
XXY (Klinefelter Syndrome): NOT DETECTED
XYY (Jacobs Syndrome): NOT DETECTED

## 2019-09-28 NOTE — Progress Notes (Signed)
Called patient no answer and voicemail full 

## 2019-09-28 NOTE — Progress Notes (Signed)
Your patient- no answer when I called. Has follow up with you. Just FYI. I sent mychart message.

## 2019-10-15 ENCOUNTER — Other Ambulatory Visit: Payer: Self-pay

## 2019-10-15 ENCOUNTER — Ambulatory Visit (INDEPENDENT_AMBULATORY_CARE_PROVIDER_SITE_OTHER): Payer: Managed Care, Other (non HMO) | Admitting: Obstetrics and Gynecology

## 2019-10-15 VITALS — BP 115/79 | Wt 321.0 lb

## 2019-10-15 DIAGNOSIS — O0990 Supervision of high risk pregnancy, unspecified, unspecified trimester: Secondary | ICD-10-CM

## 2019-10-15 DIAGNOSIS — Z6791 Unspecified blood type, Rh negative: Secondary | ICD-10-CM

## 2019-10-15 DIAGNOSIS — R0989 Other specified symptoms and signs involving the circulatory and respiratory systems: Secondary | ICD-10-CM

## 2019-10-15 DIAGNOSIS — O26892 Other specified pregnancy related conditions, second trimester: Secondary | ICD-10-CM

## 2019-10-15 DIAGNOSIS — O09899 Supervision of other high risk pregnancies, unspecified trimester: Secondary | ICD-10-CM

## 2019-10-15 DIAGNOSIS — O99210 Obesity complicating pregnancy, unspecified trimester: Secondary | ICD-10-CM

## 2019-10-15 DIAGNOSIS — Z363 Encounter for antenatal screening for malformations: Secondary | ICD-10-CM

## 2019-10-15 DIAGNOSIS — O34219 Maternal care for unspecified type scar from previous cesarean delivery: Secondary | ICD-10-CM

## 2019-10-15 NOTE — Progress Notes (Signed)
    Routine Prenatal Care Visit  Subjective  Debra Mcdaniel is a 29 y.o. G3P2002 at [redacted]w[redacted]d being seen today for ongoing prenatal care.  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  ----------------------------------------------------------------------------------- Patient reports no complaints.   Contractions: Not present. Vag. Bleeding: None.  Movement: Absent. Denies leaking of fluid.  ----------------------------------------------------------------------------------- The following portions of the patient's history were reviewed and updated as appropriate: allergies, current medications, past family history, past medical history, past social history, past surgical history and problem list. Problem list updated.   Objective  Blood pressure 115/79, weight (!) 321 lb (145.6 kg), last menstrual period 07/01/2019, not currently breastfeeding. Pregravid weight 307 lb (139.3 kg) Total Weight Gain 14 lb (6.35 kg) Urinalysis:      Fetal Status: Fetal Heart Rate (bpm): 163   Movement: Absent     General:  Alert, oriented and cooperative. Patient is in no acute distress.  Skin: Skin is warm and dry. No rash noted.   Cardiovascular: Normal heart rate noted  Respiratory: Normal respiratory effort, no problems with respiration noted  Abdomen: Soft, gravid, appropriate for gestational age. Pain/Pressure: Absent     Pelvic:   Cervical exam deferred        Extremities: Normal range of motion.     ental Status: Normal mood and affect. Normal behavior. Normal judgment and thought content.     Assessment   29 y.o. [name] at [redacted]w[redacted]d by  04/06/2020, by Last Menstrual Period presenting for routine prenatal visit  Plan   Gestational age appropriate obstetric precautions including but not limited to vaginal bleeding, contractions, leaking of fluid and fetal movement were reviewed in detail with the patient.    Return in about 4 weeks (around 11/12/2019) for ROB and Anatomy scan.  [name], MD, [facility], [facility] 10/15/2019, 5:10 PM

## 2019-10-15 NOTE — Progress Notes (Signed)
ROB [place] given (14)

## 2019-11-17 ENCOUNTER — Ambulatory Visit (INDEPENDENT_AMBULATORY_CARE_PROVIDER_SITE_OTHER): Payer: Managed Care, Other (non HMO) | Admitting: Obstetrics and Gynecology

## 2019-11-17 ENCOUNTER — Other Ambulatory Visit: Payer: Self-pay

## 2019-11-17 ENCOUNTER — Ambulatory Visit (INDEPENDENT_AMBULATORY_CARE_PROVIDER_SITE_OTHER): Payer: Managed Care, Other (non HMO)

## 2019-11-17 VITALS — BP 122/68 | Wt 334.0 lb

## 2019-11-17 DIAGNOSIS — O09892 Supervision of other high risk pregnancies, second trimester: Secondary | ICD-10-CM

## 2019-11-17 DIAGNOSIS — IMO0002 Reserved for concepts with insufficient information to code with codable children: Secondary | ICD-10-CM

## 2019-11-17 DIAGNOSIS — O99210 Obesity complicating pregnancy, unspecified trimester: Secondary | ICD-10-CM

## 2019-11-17 DIAGNOSIS — O34219 Maternal care for unspecified type scar from previous cesarean delivery: Secondary | ICD-10-CM

## 2019-11-17 DIAGNOSIS — O0990 Supervision of high risk pregnancy, unspecified, unspecified trimester: Secondary | ICD-10-CM

## 2019-11-17 DIAGNOSIS — O26892 Other specified pregnancy related conditions, second trimester: Secondary | ICD-10-CM

## 2019-11-17 DIAGNOSIS — Z6791 Unspecified blood type, Rh negative: Secondary | ICD-10-CM

## 2019-11-17 DIAGNOSIS — Z363 Encounter for antenatal screening for malformations: Secondary | ICD-10-CM

## 2019-11-17 DIAGNOSIS — Z0489 Encounter for examination and observation for other specified reasons: Secondary | ICD-10-CM

## 2019-11-17 DIAGNOSIS — R0989 Other specified symptoms and signs involving the circulatory and respiratory systems: Secondary | ICD-10-CM

## 2019-11-17 DIAGNOSIS — O99212 Obesity complicating pregnancy, second trimester: Secondary | ICD-10-CM

## 2019-11-17 DIAGNOSIS — Z3A19 19 weeks gestation of pregnancy: Secondary | ICD-10-CM

## 2019-11-17 DIAGNOSIS — O0992 Supervision of high risk pregnancy, unspecified, second trimester: Secondary | ICD-10-CM

## 2019-11-17 DIAGNOSIS — O09899 Supervision of other high risk pregnancies, unspecified trimester: Secondary | ICD-10-CM

## 2019-11-17 LAB — POCT URINALYSIS DIPSTICK OB
Glucose, UA: NEGATIVE
POC,PROTEIN,UA: NEGATIVE

## 2019-11-17 NOTE — Progress Notes (Signed)
Routine Prenatal Care Visit  Subjective  Debra Mcdaniel is a 29 y.o. G3P2002 at [redacted]w[redacted]d being seen today for ongoing prenatal care.  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  ----------------------------------------------------------------------------------- Patient reports no complaints.    .  .   . Denies leaking of fluid.  ----------------------------------------------------------------------------------- The following portions of the patient's history were reviewed and updated as appropriate: allergies, current medications, past family history, past medical history, past social history, past surgical history and problem list. Problem list updated.   Objective  Blood pressure 122/68, weight (!) 334 lb (151.5 kg), last menstrual period 07/01/2019, not currently breastfeeding. Pregravid weight 307 lb (139.3 kg) Total Weight Gain 27 lb (12.2 kg)  Body mass index is 61.09 kg/m.  Urinalysis:      Fetal Status:           General:  Alert, oriented and cooperative. Patient is in no acute distress.  Skin: Skin is warm and dry. No rash noted.   Cardiovascular: Normal heart rate noted  Respiratory: Normal respiratory effort, no problems with respiration noted  Abdomen: Soft, gravid, appropriate for gestational age.       Pelvic:  Cervical exam deferred        Extremities: Normal range of motion.     ental  Status: Normal mood and affect. Normal behavior. Normal judgment and thought content.   US OB Comp + 14 Wk  Result Date: 11/17/2019 Patient Name: Debra Mcdaniel DOB: 10-01-1990 MRN: [id number] ULTRASOUND REPORT Location: [facility] Date of Service: 11/17/2019 Indications:Anatomy Ultrasound Findings: [name] Jim intrauterine pregnancy is visualized with FHR at 153 BPM. Biometrics give an (U/S) Gestational age of [redacted]w[redacted]d and an (U/S) EDD of 04/11/2020; this correlates with the clinically established Estimated Date of Delivery: 04/06/20 Fetal presentation is Variable. EFW: 285 g ( 10 oz ). Placenta: posterior. Grade: 1 AFI: subjectively normal. Anatomic survey is incomplete for DA, LVOT, fetal stomach and normal; Gender - female.  Impression: 1. [redacted]w[redacted]d Viable Singleton Intrauterine pregnancy by U/S. 2. (U/S) EDD is consistent with Clinically established Estimated Date of Delivery: 04/06/20 . 3. Normal Anatomy Scan but incomplete for the structures listed above Recommendations: 1.Clinical correlation with the patient's History and Physical Exam. Deanna Artis, RT  There is a singleton gestation with subjectively normal amniotic fluid volume. The fetal biometry correlates with established dating. Detailed evaluation of the fetal anatomy was performed.The fetal anatomical survey appears within normal limits within the resolution of ultrasound as described above.  Scan remains incomplete for above mentioned structures. It must be noted that a normal ultrasound is unable to rule out fetal aneuploidy, subtle defects such as small ASD or VDS may also not be visible on imaging.  [name], MD, [facility], [facility] 11/17/2019, 4:29 PM     Assessment   29 y.o. [id number] at [redacted]w[redacted]d by  04/06/2020, by Last Menstrual Period presenting for routine prenatal visit  Plan   Pregnancy3 Problems (from 07/01/19 to present)    Problem Noted Resolved   Short interval between pregnancies affecting  pregnancy, antepartum  09/08/2019 by [name], MD No   Labile blood pressure 12/04/2018 by Jimmey Ralph, MD No   Overview Signed 12/04/2018  4:19 PM by Jimmey Ralph, MD    Pt states white coat , fluctuates when she comes for visits Frequently will have systolic BP in 140 range       Obesity affecting pregnancy, antepartum 09/03/2018 by [name], MD No   Overview Signed 12/04/2018  2:38 PM by Jimmey Ralph, MD    BMI 59  Normal early glucola        Supervision of high-risk pregnancy 08/29/2018 by Oswaldo Conroy, CNM No   Overview Addendum 11/17/2019  4:33 PM by [name], MD    [facility]inic [facility] Prenatal Labs  Dating LMP = 11 week [place] Blood type: O negative  Genetic Screen NIPS: Normal XY Inheritest: negative Antibody:Negative  Anatomic [place] Incomplete 7/13 Rubella:Immune Varicella: Immune  GTT Early: HgbA1C 5.5 Third trimester: RPR: NR  Rhogam 01/09/19 HBsAg: negative  TDaP vaccine                       Flu Shot: HIV: negative  Baby Food Bottle                            [id number]  Contraception Considering BTL Pap:01/08/2018 NIL  Hgb  AA   CS/VBAC Repeat C-section 11/10   Support Person              Previous Version   History of cesarean delivery, currently pregnant 08/29/2018 by Oswaldo Conroy, CNM No   Overview Signed 12/04/2018  2:39 PM by Jimmey Ralph, MD    2014 LTCS via pfannenstiel in 2014 at [facility] for FTP           Gestational age appropriate obstetric precautions including but not limited to vaginal bleeding, contractions, leaking of fluid and fetal movement were reviewed in detail with the patient.    Anatomy scan incomplete  Return in about 4 weeks (around 12/15/2019) for ROB and follow up anatomy scan.  [name], MD, [facility], [facility] 11/17/2019, 4:52 PM

## 2019-11-17 NOTE — Progress Notes (Signed)
ROB  °Anatomy scan °

## 2019-12-15 ENCOUNTER — Other Ambulatory Visit: Payer: Self-pay

## 2019-12-15 ENCOUNTER — Ambulatory Visit (INDEPENDENT_AMBULATORY_CARE_PROVIDER_SITE_OTHER): Payer: Managed Care, Other (non HMO) | Admitting: Obstetrics and Gynecology

## 2019-12-15 ENCOUNTER — Ambulatory Visit (INDEPENDENT_AMBULATORY_CARE_PROVIDER_SITE_OTHER): Payer: Managed Care, Other (non HMO)

## 2019-12-15 VITALS — BP 138/72 | Wt 336.0 lb

## 2019-12-15 DIAGNOSIS — Z3A23 23 weeks gestation of pregnancy: Secondary | ICD-10-CM

## 2019-12-15 DIAGNOSIS — O09899 Supervision of other high risk pregnancies, unspecified trimester: Secondary | ICD-10-CM

## 2019-12-15 DIAGNOSIS — O34219 Maternal care for unspecified type scar from previous cesarean delivery: Secondary | ICD-10-CM

## 2019-12-15 DIAGNOSIS — O99210 Obesity complicating pregnancy, unspecified trimester: Secondary | ICD-10-CM

## 2019-12-15 DIAGNOSIS — O0990 Supervision of high risk pregnancy, unspecified, unspecified trimester: Secondary | ICD-10-CM | POA: Diagnosis not present

## 2019-12-15 DIAGNOSIS — Z0489 Encounter for examination and observation for other specified reasons: Secondary | ICD-10-CM

## 2019-12-15 DIAGNOSIS — IMO0002 Reserved for concepts with insufficient information to code with codable children: Secondary | ICD-10-CM

## 2019-12-15 DIAGNOSIS — R0989 Other specified symptoms and signs involving the circulatory and respiratory systems: Secondary | ICD-10-CM

## 2019-12-15 LAB — POCT URINALYSIS DIPSTICK OB
Glucose, UA: NEGATIVE
POC,PROTEIN,UA: NEGATIVE

## 2019-12-15 NOTE — Progress Notes (Signed)
Routine Prenatal Care Visit  Subjective  Debra Mcdaniel is a 29 y.o. G3P2002 at 5358w6d being seen today for ongoing prenatal care.  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  ----------------------------------------------------------------------------------- Patient reports no complaints.   Contractions: Not present. Vag. Bleeding: None.  Movement: Present. Denies leaking of fluid.  ----------------------------------------------------------------------------------- The following portions of the patient's history were reviewed and updated as appropriate: allergies, current medications, past family history, past medical history, past social history, past surgical history and problem list. Problem list updated.   Objective  Blood pressure 138/72, weight (!) 336 lb (152.4 kg), last menstrual period 07/01/2019, not currently breastfeeding. Pregravid weight 307 lb (139.3 kg) Total Weight Gain 29 lb (13.2 kg) Urinalysis:      Fetal Status: Fetal Heart Rate (bpm): 150   Movement: Present     General:  Alert, oriented and cooperative. Patient is in no acute distress.  Skin: Skin is warm and dry. No rash noted.   Cardiovascular: Normal heart rate noted  Respiratory: Normal respiratory effort, no problems with respiration noted  Abdomen: Soft, gravid, appropriate for gestational age. Pain/Pressure: Absent     Pelvic:   Cervical exam deferred        Extremities: Normal range of motion.     ental Status: Normal mood and affect. Normal behavior. Normal judgment and thought content.   US OB Follow Up  Result Date: 12/15/2019 Patient Name: Debra Mcdaniel DOB: 08-08-1990 MRN: [id number] ULTRASOUND REPORT Location: [facility] Date of Service: 12/15/2019 Indications: Anatomy follow up ultrasound Findings: [name] JimSingleton intrauterine pregnancy is visualized with FHR at 154 BPM. Fetal presentation is Cephalic. Placenta: posterior. Grade: 1 AFI: subjectively normal. Anatomic survey is complete.  The DA and LVOT were seen today. The stomach is again questionably visualized. There is no free peritoneal fluid in the cul de sac. Impression: 1. 2358w6d Viable Singleton Intrauterine pregnancy previously established criteria. 2. Normal Anatomy Scan is now complete. 3. The stomach is questionably visualized otherwise the anatomy scan appears normal. Recommendations: 1.Clinical correlation with the patient's History and Physical Exam. Deanna ArtisElyse S Fairbanks, RT There is a singleton gestation with subjectively normal amniotic fluid volume.  Limited evaluation of the fetal anatomy was performed today, focusing on on anatomic structures not fully visualized at the time of prior study.The visualized fetal anatomical survey appears within normal limits within the resolution of ultrasound as described above, and the anatomic survey is remains incomplete for fetal stomach.  It must be noted that a normal ultrasound is unable to rule out fetal aneuploidy, subtle defects such as small ASD or VDS may also not be visible on imaging. Vena AustriaAndreas Neil Errickson, MD, Merlinda FrederickFACOG [facility], [facility] 12/15/2019, 3:50 PM   US MFM OB COMP + 14 WK  Result Date: 12/24/2019 ----------------------------------------------------------------------  OBSTETRICS REPORT                       (Signed Final 12/24/2019 01:48 pm)  ---------------------------------------------------------------------- Patient Info  ID #:       [id number]  D.O.B.:  May 23, 1990 (29 yrs)  Name:       Debra Mcdaniel                 Visit Date: 12/24/2019 12:06 pm ---------------------------------------------------------------------- Performed By  Attending:        Lin Landsman      Ref. Address:      [address]                    MD                                                              [place], [place],                                                              [place] [address]  Performed By:     Francene Boyers           Location:          [facility]                                                              [facility] [place]                                                              [place]  Referred By:      [name] MD ---------------------------------------------------------------------- Orders  #  Description                           Code        Ordered By  1  [place] MFM OB COMP + 14 WK                01601.09    Yunior Jain ----------------------------------------------------------------------  #  Order #                     Accession #                Episode #  1  [id number]                   [id number]  [id number] ---------------------------------------------------------------------- Indications  [redacted] weeks gestation of pregnancy                 Z3A.25  Obesity complicating pregnancy, second          O99.212  trimester  History of cesarean delivery, currently         O34.219  pregnant ---------------------------------------------------------------------- Fetal Evaluation  Num Of Fetuses:          1  Fetal Heart              147  Rate(bpm):  Cardiac Activity:        Observed  Fetal Lie:               Variable  Presentation:            Variable  Placenta:                Posterior  P. Cord Insertion:        Visualized, central ---------------------------------------------------------------------- Biometry  BPD:      60.1  mm     G. Age:  24w 4d         20  %    CI:         71.73  %    70 - 86                                                          FL/HC:       20.3  %    18.7 - 20.3  HC:      225.9  mm     G. Age:  24w 4d         14  %    HC/AC:       1.11       1.04 - 1.22  AC:      202.8  mm     G. Age:  24w 6d         33  %    FL/BPD:      76.4  %    71 - 87  FL:       45.9  mm     G. Age:  25w 2d         39  %    FL/AC:       22.6  %    20 - 24  HUM:      40.3  mm     G. Age:  24w 3d         28  %  CER:      27.1  mm     G. Age:  24w 2d         30  %  LV:        3.3  mm  CM:        4.7  mm  Est. FW:     755   g    1 lb 11 oz      32  %                     m ---------------------------------------------------------------------- Gestational Age  LMP:           25w 1d        Date:  07/01/19                 EDD:    04/06/20  U/S Today:     24w 6d                                        EDD:    04/08/20  Best:          25w 1d     Det. By:  LMP  (07/01/19)          EDD:    04/06/20 ---------------------------------------------------------------------- Anatomy  Cranium:               Appears normal         Aortic Arch:            Appears normal  Cavum:                 Appears normal         Ductal Arch:            Not well visualized  Ventricles:            Appears normal         Diaphragm:              Appears normal  Choroid Plexus:        Appears normal         Stomach:                Appears normal,                                                                        left sided  Cerebellum:            Appears normal         Abdomen:                Appears normal  Posterior Fossa:       Appears normal         Abdominal Wall:         Appears nml (cord                                                                        insert, abd wall)  Nuchal Fold:           Appears normal         Cord Vessels:           Appears  normal (3  vessel cord)  Face:                  Orbits nl; profile not Kidneys:                Appear normal                         well visualized  Lips:                  Not well visualized    Bladder:                Appears normal  Heart:                 Appears normal         Spine:                  Appears normal                         (4CH, axis, and                         situs)  RVOT:                  Appears normal         Upper Extremities:      Appears normal  LVOT:                  Not well visualized    Lower Extremities:      Appears normal ---------------------------------------------------------------------- Impression  Debra Mcdaniel is here to evaluate fetal stomach as it was not  observed during the anatomy exam at her providers office.  Today we observed a single intrauterine pregnancy with  measurements consistent with dates.  There was normal anatomy and in particular the stomach  was observed.  Given maternal habitus suboptimal views of the fetal  anatomy were observed today however, they were previously  documented on an outside examination.  Debra Mcdaniel has a prior history of cesarean delivery which will  be performed in November at 39 weeks. She also has  chronic headaches thay she states have been well controlled.  Her prior pregnancies were uncomplicated per her report.  At this time given Debra Mcdaniel's BMI I recommend serial  growth exams every 4 weeks.  I discussed today's visit with Dr. Bonney Aid who primary  wanted [place] to assess the fetal stomach with consult if the  stomach was absent. Other elements of her history are well  controlled. ---------------------------------------------------------------------- Recommendations  Continue serial growth every 4 weeks this was not scheduled  at our offices however, if desired please don't hesitate to  contact us.  We appreciate participating in the care of your patient.  ----------------------------------------------------------------------               Lin Landsman, MD Electronically Signed Final Report   12/24/2019 01:48 pm ----------------------------------------------------------------------    Assessment   29 y.o. [id number] at [redacted]w[redacted]d by  04/06/2020, by Last Menstrual Period presenting for routine prenatal visit  Plan   Pregnancy3 Problems (from 07/01/19 to present)    Problem Noted Resolved   Short interval between pregnancies affecting pregnancy, antepartum 09/08/2019 by [name], MD No   Labile blood pressure 12/04/2018 by Jimmey Ralph, MD No   Overview Signed 12/04/2018  4:19 PM by Jimmey Ralph, MD  Pt states white coat , fluctuates when she comes for visits Frequently will have systolic BP in 140 range       Obesity affecting pregnancy, antepartum 09/03/2018 by [name], MD No   Overview Signed 12/04/2018  2:38 PM by Jimmey Ralph, MD    BMI 59  Normal early glucola        Supervision of high-risk pregnancy 08/29/2018 by Oswaldo Conroy, CNM No   Overview Addendum 11/17/2019  4:52 PM by [name], MD    Clinic [facility] Prenatal Labs  Dating LMP = 11 week [place] Blood type: O negative  Genetic Screen NIPS: Normal XY Inheritest: negative Antibody:Negative  Anatomic [place] Incomplete 7/13 Rubella:Immune Varicella: Immune  GTT Early: HgbA1C 5.5 Third trimester: RPR: NR  Rhogam 01/09/19 HBsAg: negative  TDaP vaccine                       Flu Shot: HIV: negative  Baby Food Bottle                            KGM:[id number]  Contraception Considering BTL Pap:01/08/2018 NIL  Hgb  AA   CS/VBAC Repeat C-section 11/10   Support Person              Previous Version   History of cesarean delivery, currently pregnant 08/29/2018 by Oswaldo Conroy, CNM No   Overview Signed 12/04/2018  2:39 PM by Jimmey Ralph, MD    2014 LTCS via [email or web address] in 2014 at [facility] for FTP           Gestational age  appropriate obstetric precautions including but not limited to vaginal bleeding, contractions, leaking of fluid and fetal movement were reviewed in detail with the patient.    - MFM referral fetal stomach  Return in about 4 weeks (around 01/12/2020) for ROB and growth scan.  [name], MD, [facility], [facility] 12/15/2019, 3:48 PM

## 2019-12-15 NOTE — Progress Notes (Signed)
ROB Anatomy scan today 

## 2019-12-16 ENCOUNTER — Other Ambulatory Visit: Payer: Self-pay | Admitting: Obstetrics and Gynecology

## 2019-12-16 DIAGNOSIS — O34219 Maternal care for unspecified type scar from previous cesarean delivery: Secondary | ICD-10-CM

## 2019-12-16 DIAGNOSIS — O09899 Supervision of other high risk pregnancies, unspecified trimester: Secondary | ICD-10-CM

## 2019-12-16 DIAGNOSIS — IMO0002 Reserved for concepts with insufficient information to code with codable children: Secondary | ICD-10-CM

## 2019-12-16 DIAGNOSIS — R0989 Other specified symptoms and signs involving the circulatory and respiratory systems: Secondary | ICD-10-CM

## 2019-12-16 DIAGNOSIS — O99210 Obesity complicating pregnancy, unspecified trimester: Secondary | ICD-10-CM

## 2019-12-21 ENCOUNTER — Ambulatory Visit: Payer: Managed Care, Other (non HMO)

## 2019-12-24 ENCOUNTER — Ambulatory Visit: Payer: Managed Care, Other (non HMO)

## 2019-12-24 ENCOUNTER — Ambulatory Visit: Payer: Managed Care, Other (non HMO) | Attending: Maternal & Fetal Medicine

## 2019-12-24 ENCOUNTER — Other Ambulatory Visit: Payer: Self-pay

## 2019-12-24 DIAGNOSIS — O99210 Obesity complicating pregnancy, unspecified trimester: Secondary | ICD-10-CM

## 2019-12-24 DIAGNOSIS — Z3A25 25 weeks gestation of pregnancy: Secondary | ICD-10-CM

## 2019-12-24 DIAGNOSIS — O09899 Supervision of other high risk pregnancies, unspecified trimester: Secondary | ICD-10-CM

## 2019-12-24 DIAGNOSIS — O0992 Supervision of high risk pregnancy, unspecified, second trimester: Secondary | ICD-10-CM

## 2019-12-24 DIAGNOSIS — O99212 Obesity complicating pregnancy, second trimester: Secondary | ICD-10-CM | POA: Diagnosis not present

## 2019-12-24 DIAGNOSIS — Z0489 Encounter for examination and observation for other specified reasons: Secondary | ICD-10-CM

## 2019-12-24 DIAGNOSIS — IMO0002 Reserved for concepts with insufficient information to code with codable children: Secondary | ICD-10-CM

## 2019-12-24 DIAGNOSIS — R0989 Other specified symptoms and signs involving the circulatory and respiratory systems: Secondary | ICD-10-CM

## 2019-12-24 DIAGNOSIS — O34219 Maternal care for unspecified type scar from previous cesarean delivery: Secondary | ICD-10-CM | POA: Diagnosis not present

## 2020-01-12 ENCOUNTER — Other Ambulatory Visit: Payer: Self-pay | Admitting: Obstetrics and Gynecology

## 2020-01-12 ENCOUNTER — Other Ambulatory Visit (INDEPENDENT_AMBULATORY_CARE_PROVIDER_SITE_OTHER): Payer: Managed Care, Other (non HMO)

## 2020-01-12 ENCOUNTER — Other Ambulatory Visit: Payer: Self-pay

## 2020-01-12 ENCOUNTER — Other Ambulatory Visit: Payer: Managed Care, Other (non HMO)

## 2020-01-12 ENCOUNTER — Ambulatory Visit (INDEPENDENT_AMBULATORY_CARE_PROVIDER_SITE_OTHER): Payer: Managed Care, Other (non HMO) | Admitting: Obstetrics and Gynecology

## 2020-01-12 VITALS — BP 134/84 | HR 95 | Wt 335.0 lb

## 2020-01-12 DIAGNOSIS — O0990 Supervision of high risk pregnancy, unspecified, unspecified trimester: Secondary | ICD-10-CM

## 2020-01-12 DIAGNOSIS — O26893 Other specified pregnancy related conditions, third trimester: Secondary | ICD-10-CM | POA: Diagnosis not present

## 2020-01-12 DIAGNOSIS — Z3A27 27 weeks gestation of pregnancy: Secondary | ICD-10-CM | POA: Diagnosis not present

## 2020-01-12 DIAGNOSIS — O99210 Obesity complicating pregnancy, unspecified trimester: Secondary | ICD-10-CM

## 2020-01-12 DIAGNOSIS — Z3689 Encounter for other specified antenatal screening: Secondary | ICD-10-CM

## 2020-01-12 DIAGNOSIS — O34219 Maternal care for unspecified type scar from previous cesarean delivery: Secondary | ICD-10-CM

## 2020-01-12 DIAGNOSIS — O26892 Other specified pregnancy related conditions, second trimester: Secondary | ICD-10-CM | POA: Diagnosis not present

## 2020-01-12 DIAGNOSIS — Z3A23 23 weeks gestation of pregnancy: Secondary | ICD-10-CM

## 2020-01-12 DIAGNOSIS — Z6791 Unspecified blood type, Rh negative: Secondary | ICD-10-CM

## 2020-01-12 DIAGNOSIS — O09899 Supervision of other high risk pregnancies, unspecified trimester: Secondary | ICD-10-CM

## 2020-01-12 DIAGNOSIS — O0992 Supervision of high risk pregnancy, unspecified, second trimester: Secondary | ICD-10-CM

## 2020-01-12 LAB — POCT URINALYSIS DIPSTICK OB: Glucose, UA: NEGATIVE

## 2020-01-12 MED ORDER — RHO D IMMUNE GLOBULIN 1500 UNIT/2ML IJ SOSY
300.0000 ug | PREFILLED_SYRINGE | Freq: Once | INTRAMUSCULAR | Status: AC
  Administered 2020-01-12: 300 ug via INTRAMUSCULAR

## 2020-01-12 NOTE — Addendum Note (Signed)
Addended by: [place], Daysia Vandenboom B on: 01/12/2020 10:54 AM   Modules accepted: Orders

## 2020-01-12 NOTE — Progress Notes (Signed)
ROB 28 week labs Growth scan today

## 2020-01-12 NOTE — Progress Notes (Signed)
Routine Prenatal Care Visit  Subjective  Debra Mcdaniel is a 29 y.o. G3P2002 at 6045w6d being seen today for ongoing prenatal care.  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  ----------------------------------------------------------------------------------- Patient reports no complaints.   Contractions: Not present. Vag. Bleeding: None.  Movement: Present. Denies leaking of fluid.  ----------------------------------------------------------------------------------- The following portions of the patient's history were reviewed and updated as appropriate: allergies, current medications, past family history, past medical history, past social history, past surgical history and problem list. Problem list updated.   Objective  Blood pressure 134/84, pulse 95, weight (!) 335 lb (152 kg), last menstrual period 07/01/2019, not currently breastfeeding. Pregravid weight 307 lb (139.3 kg) Total Weight Gain 28 lb (12.7 kg) Urinalysis:      Fetal Status: Fetal Heart Rate (bpm): 140   Movement: Present  Presentation: Complete Breech  General:  Alert, oriented and cooperative. Patient is in no acute distress.  Skin: Skin is warm and dry. No rash noted.   Cardiovascular: Normal heart rate noted  Respiratory: Normal respiratory effort, no problems with respiration noted  Abdomen: Soft, gravid, appropriate for gestational age.  Pain/Pressure: Absent     Pelvic:  Cervical exam deferred        Extremities: Normal range of motion.     ental Status: Normal mood and affect. Normal behavior. Normal judgment and thought content.   US OB Follow Up  Result Date: 01/12/2020 Patient Name: Debra SaneLatreacia Viner DOB: 1990/11/27 MRN: [id number] ULTRASOUND REPORT Location: [facility] Date of Service: 01/12/2020 Indications:growth/afi Findings: [name] JimSingleton intrauterine pregnancy is visualized with FHR at 148 BPM. Biometrics give an (U/S) Gestational age of 8786w1d and an (U/S) EDD of 04/11/2020; this correlates with the clinically established Estimated Date of Delivery: 04/06/20. Fetal presentation is Breech. Placenta: fundal. Grade: 2 AFI: 14.9 cm Growth percentile is 45.7.  AC percentile is 72.1. EFW: 1088 g ( 2 lb 6 oz ) Impression: 1. 2945w6d Viable Singleton Intrauterine pregnancy previously established criteria. 2. Growth is 45.7%ile.  AFI is 14.9 cm. Recommendations: 1.Clinical correlation with the patient's History and Physical Exam. Deanna ArtisElyse S Fairbanks, RT There is a singleton gestation with normal amniotic fluid volume. The fetal biometry correlates with established dating.  Limited fetal anatomy was performed.The visualized fetal anatomical survey appears within normal limits within the resolution of ultrasound as described above.  It must be noted that a normal ultrasound is unable to rule out fetal aneuploidy.  Vena AustriaAndreas Kemari Narez, MD, Merlinda FrederickFACOG [facility], [facility] 01/12/2020, 10:24 AM   US OB Follow Up  Result Date: 12/15/2019 Patient Name: Debra SaneLatreacia Brasel DOB: 1990/11/27 MRN: [id number] ULTRASOUND REPORT Location: [facility] Date of Service: 12/15/2019 Indications: Anatomy follow up ultrasound Findings: [name] JimSingleton intrauterine pregnancy is visualized with FHR at 154 BPM. Fetal presentation is Cephalic. Placenta: posterior. Grade: 1 AFI: subjectively normal. Anatomic survey is complete.  The DA and LVOT were seen today. The  stomach is again questionably visualized. There is no free peritoneal fluid in the cul de sac. Impression: 1. 6948w6d Viable Singleton Intrauterine pregnancy previously established criteria. 2. Normal Anatomy  Scan is now complete. 3. The stomach is questionably visualized otherwise the anatomy scan appears normal. Recommendations: 1.Clinical correlation with the patient's History and Physical Exam. Deanna Artis, RT There is a singleton gestation with subjectively normal amniotic fluid volume.  Limited evaluation of the fetal anatomy was performed today, focusing on on anatomic structures not fully visualized at the time of prior study.The visualized fetal anatomical survey appears within normal limits within the resolution of ultrasound as described above, and the anatomic survey is remains incomplete for fetal stomach.  It must be noted that a normal ultrasound is unable to rule out fetal aneuploidy, subtle defects such as small ASD or VDS may also not be visible on imaging. [name], MD, [facility], [facility] 12/15/2019, 3:50 PM   [place] MFM OB COMP + 14 WK  Result Date: 12/24/2019 ----------------------------------------------------------------------  OBSTETRICS REPORT                       (Signed Final 12/24/2019 01:48 pm) ---------------------------------------------------------------------- Patient Info  ID #:       [id number]                          D.O.B.:  June 01, 1990 (29 yrs)  Name:       Debra Sane                 Visit Date: 12/24/2019 12:06 pm ---------------------------------------------------------------------- Performed By  Attending:        Lin Landsman      Ref. Address:      [address]                    MD                                                              [place], [place],                                                              [place] [address]  Performed By:     Francene Boyers           [facility]          [facility]                                                              at [facility]  [place]  Referred By:      [name] MD ---------------------------------------------------------------------- Orders  #  Description                           Code        Ordered By  1  [place] MFM OB COMP + 14 WK                76805.01    Rickesha Veracruz ----------------------------------------------------------------------  #  Order #                     Accession #                Episode #  1  [id number]                   [id number]                 [id number] ---------------------------------------------------------------------- Indications  [redacted] weeks gestation of pregnancy                 Z3A.25  Obesity complicating pregnancy, second          O99.212  trimester  History of cesarean delivery, currently         O34.219  pregnant ---------------------------------------------------------------------- Fetal Evaluation  Num Of Fetuses:          1  Fetal Heart              147  Rate(bpm):  Cardiac Activity:        Observed  Fetal Lie:               Variable  Presentation:            Variable  Placenta:                Posterior  P. Cord Insertion:       Visualized, central ---------------------------------------------------------------------- Biometry  BPD:      60.1  mm     G. Age:  24w 4d         20  %    CI:         71.73  %    70 - 86                                                          FL/HC:       20.3  %    18.7 - 20.3  HC:      225.9  mm     G. Age:  24w 4d         14  %    HC/AC:       1.11       1.04 - 1.22  AC:      202.8  mm     G. Age:  24w 6d         33  %    FL/BPD:      76.4  %    71 - 87  FL:       45.9  mm     G. Age:  25w  2d         39  %    FL/AC:       22.6  %    20 - 24  HUM:      40.3  mm     G. Age:  24w 3d         28  %  CER:      27.1  mm     G. Age:  24w 2d         30  %  LV:         3.3  mm  CM:        4.7  mm  Est. FW:     755   g    1 lb 11 oz      32  %                     m ---------------------------------------------------------------------- Gestational Age  LMP:           25w 1d        Date:  07/01/19                 EDD:    04/06/20  U/S Today:     24w 6d                                        EDD:    04/08/20  Best:          25w 1d     Det. By:  LMP  (07/01/19)          EDD:    04/06/20 ---------------------------------------------------------------------- Anatomy  Cranium:               Appears normal         Aortic Arch:            Appears normal  Cavum:                 Appears normal         Ductal Arch:            Not well visualized  Ventricles:            Appears normal         Diaphragm:              Appears normal  Choroid Plexus:        Appears normal         Stomach:                Appears normal,                                                                        left sided  Cerebellum:            Appears normal         Abdomen:                Appears normal  Posterior Fossa:       Appears normal         Abdominal Wall:  Appears nml (cord                                                                        insert, abd wall)  Nuchal Fold:           Appears normal         Cord Vessels:           Appears normal (3                                                                        vessel cord)  Face:                  Orbits nl; profile not Kidneys:                Appear normal                         well visualized  Lips:                  Not well visualized    Bladder:                Appears normal  Heart:                 Appears normal         Spine:                  Appears normal                         (4CH, axis, and                         situs)  RVOT:                  Appears normal         Upper Extremities:      Appears normal  LVOT:                  Not well visualized    Lower Extremities:      Appears normal  ---------------------------------------------------------------------- Impression  Ms. Dalesandro is here to evaluate fetal stomach as it was not  observed during the anatomy exam at her providers office.  Today we observed a single intrauterine pregnancy with  measurements consistent with dates.  There was normal anatomy and in particular the stomach  was observed.  Given maternal habitus suboptimal views of the fetal  anatomy were observed today however, they were previously  documented on an outside examination.  Ms. Worthington has a prior history of cesarean delivery which will  be performed in November at 39 weeks. She also has  chronic headaches thay she states have been well controlled.  Her prior pregnancies were uncomplicated per her report.  At this time given Ms. Mey's BMI I recommend serial  growth exams every 4 weeks.  I discussed today's visit with Dr. Bonney Aid who primary  wanted [place] to assess the fetal stomach with consult if the  stomach was absent. Other elements of her history are well  controlled. ---------------------------------------------------------------------- Recommendations  Continue serial growth every 4 weeks this was not scheduled  at our offices however, if desired please don't hesitate to  contact us.  We appreciate participating in the care of your patient. ----------------------------------------------------------------------               Lin Landsman, MD Electronically Signed Final Report   12/24/2019 01:48 pm ----------------------------------------------------------------------    Assessment   29 y.o. [id number] at [redacted]w[redacted]d by  04/06/2020, by Last Menstrual Period presenting for routine prenatal visit  Plan   Pregnancy3 Problems (from 07/01/19 to present)    Problem Noted Resolved   Short interval between pregnancies affecting pregnancy, antepartum 09/08/2019 by [name], MD No   Labile blood pressure 12/04/2018 by Jimmey Ralph, MD No   Overview Signed  12/04/2018  4:19 PM by Jimmey Ralph, MD    Pt states white coat , fluctuates when she comes for visits Frequently will have systolic BP in 140 range       Obesity affecting pregnancy, antepartum 09/03/2018 by [name], MD No   Overview Signed 12/04/2018  2:38 PM by Jimmey Ralph, MD    BMI 59  Normal early glucola        Supervision of high-risk pregnancy 08/29/2018 by Oswaldo Conroy, CNM No   Overview Addendum 11/17/2019  4:52 PM by [name], MD    Clinic [facility] Prenatal Labs  Dating LMP = 11 week [place] Blood type: O negative  Genetic Screen NIPS: Normal XY Inheritest: negative Antibody:Negative  Anatomic [place] Incomplete 7/13 Rubella:Immune Varicella: Immune  GTT Early: HgbA1C 5.5 Third trimester: RPR: NR  Rhogam 01/12/20 HBsAg: negative  TDaP vaccine                       Flu Shot: HIV: negative  Baby Food Bottle                            ION:[id number]  Contraception Considering BTL Pap:01/08/2018 NIL  Hgb  AA   CS/VBAC Repeat C-section    Support Person              Previous Version   History of cesarean delivery, currently pregnant 08/29/2018 by Oswaldo Conroy, CNM No   Overview Signed 12/04/2018  2:39 PM by Jimmey Ralph, MD    2014 LTCS via [email or web address] in 2014 at [facility] for FTP           Gestational age appropriate obstetric precautions including but not limited to vaginal bleeding, contractions, leaking of fluid and fetal movement were reviewed in detail with the patient.    Return in about 2 weeks (around 01/26/2020) for ROB.  [name], MD, [facility] Core [facility], [facility] 01/12/2020, 10:26 AM

## 2020-01-13 LAB — 28 WEEK RH+PANEL
Basophils Absolute: 0 10*3/uL (ref 0.0–0.2)
Basos: 0 %
EOS (ABSOLUTE): 0.1 10*3/uL (ref 0.0–0.4)
Eos: 1 %
Gestational Diabetes Screen: 123 mg/dL (ref 65–139)
HIV Screen 4th Generation wRfx: NONREACTIVE
Hematocrit: 33.4 % — ABNORMAL LOW (ref 34.0–46.6)
Hemoglobin: 11 g/dL — ABNORMAL LOW (ref 11.1–15.9)
Immature Grans (Abs): 0 10*3/uL (ref 0.0–0.1)
Immature Granulocytes: 0 %
Lymphocytes Absolute: 1.6 10*3/uL (ref 0.7–3.1)
Lymphs: 20 %
MCH: 30.2 pg (ref 26.6–33.0)
MCHC: 32.9 g/dL (ref 31.5–35.7)
MCV: 92 fL (ref 79–97)
Monocytes Absolute: 0.5 10*3/uL (ref 0.1–0.9)
Monocytes: 7 %
Neutrophils Absolute: 5.7 10*3/uL (ref 1.4–7.0)
Neutrophils: 72 %
Platelets: 318 10*3/uL (ref 150–450)
RBC: 3.64 x10E6/uL — ABNORMAL LOW (ref 3.77–5.28)
RDW: 13.3 % (ref 11.7–15.4)
RPR Ser Ql: NONREACTIVE
WBC: 7.9 10*3/uL (ref 3.4–10.8)

## 2020-01-15 ENCOUNTER — Other Ambulatory Visit: Payer: Self-pay

## 2020-01-15 ENCOUNTER — Ambulatory Visit: Payer: Managed Care, Other (non HMO)

## 2020-01-15 VITALS — BP 106/70

## 2020-01-15 DIAGNOSIS — Z0130 Encounter for examination of blood pressure without abnormal findings: Secondary | ICD-10-CM

## 2020-01-15 NOTE — Progress Notes (Signed)
Pt comes in for BP ck; c/o headache; very slightly dizzy like if has head laid back against the wall and then lifts it she's dizzy for a few seconds; GFM; Pt not eating much and not hydrating very well.  Adv pt BP is really good at 106/70; can take e.s. tylenol two q4-6hrs; drink at least 64oz of water a day; is allowed 16oz of caffeine a day including choc; to eat something nutritious every 3 hrs; may want to lie in a dark room, apply ice to affected area and rest.  Pt aware to call if worsens.

## 2020-01-15 NOTE — Telephone Encounter (Signed)
Patient is added to nurse schedule for today at 3:20

## 2020-01-26 ENCOUNTER — Encounter: Payer: Managed Care, Other (non HMO) | Admitting: Obstetrics

## 2020-01-27 ENCOUNTER — Other Ambulatory Visit: Payer: Self-pay | Admitting: Obstetrics and Gynecology

## 2020-01-27 ENCOUNTER — Ambulatory Visit (INDEPENDENT_AMBULATORY_CARE_PROVIDER_SITE_OTHER): Payer: Managed Care, Other (non HMO) | Admitting: Obstetrics and Gynecology

## 2020-01-27 ENCOUNTER — Other Ambulatory Visit: Payer: Self-pay

## 2020-01-27 ENCOUNTER — Other Ambulatory Visit: Payer: Managed Care, Other (non HMO)

## 2020-01-27 VITALS — BP 111/54 | HR 123 | Wt 343.0 lb

## 2020-01-27 DIAGNOSIS — O99210 Obesity complicating pregnancy, unspecified trimester: Secondary | ICD-10-CM

## 2020-01-27 DIAGNOSIS — Z23 Encounter for immunization: Secondary | ICD-10-CM | POA: Diagnosis not present

## 2020-01-27 DIAGNOSIS — O34219 Maternal care for unspecified type scar from previous cesarean delivery: Secondary | ICD-10-CM

## 2020-01-27 DIAGNOSIS — R0989 Other specified symptoms and signs involving the circulatory and respiratory systems: Secondary | ICD-10-CM

## 2020-01-27 DIAGNOSIS — Z3A30 30 weeks gestation of pregnancy: Secondary | ICD-10-CM | POA: Diagnosis not present

## 2020-01-27 DIAGNOSIS — O09899 Supervision of other high risk pregnancies, unspecified trimester: Secondary | ICD-10-CM

## 2020-01-27 DIAGNOSIS — O0993 Supervision of high risk pregnancy, unspecified, third trimester: Secondary | ICD-10-CM

## 2020-01-27 LAB — POCT URINALYSIS DIPSTICK OB
Glucose, UA: NEGATIVE
POC,PROTEIN,UA: NEGATIVE

## 2020-01-27 NOTE — Progress Notes (Signed)
ROB TDAP given today 

## 2020-01-27 NOTE — Progress Notes (Signed)
Routine Prenatal Care Visit  Subjective  Debra Mcdaniel is a 29 y.o. G3P2002 at [redacted]w[redacted]d being seen today for ongoing prenatal care.  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  ----------------------------------------------------------------------------------- Patient reports no complaints.   Contractions: Not present. Vag. Bleeding: None.  Movement: Present. Denies leaking of fluid.  ----------------------------------------------------------------------------------- The following portions of the patient's history were reviewed and updated as appropriate: allergies, current medications, past family history, past medical history, past social history, past surgical history and problem list. Problem list updated.   Objective  Blood pressure (!) 143/70, pulse (!) 123, weight (!) 343 lb (155.6 kg), last menstrual period 07/01/2019, not currently breastfeeding. Pregravid weight 307 lb (139.3 kg) Total Weight Gain 36 lb (16.3 kg) Urinalysis:      Fetal Status: Fetal Heart Rate (bpm): 150 Fundal Height: 34 cm Movement: Present     General:  Alert, oriented and cooperative. Patient is in no acute distress.  Skin: Skin is warm and dry. No rash noted.   Cardiovascular: Normal heart rate noted  Respiratory: Normal respiratory effort, no problems with respiration noted  Abdomen: Soft, gravid, appropriate for gestational  age. Pain/Pressure: Absent     Pelvic:  Cervical exam deferred        Extremities: Normal range of motion.     ental Status: Normal mood and affect. Normal behavior. Normal judgment and thought content.     Assessment   29 y.o. [id number] at [redacted]w[redacted]d by  04/06/2020, by Last Menstrual Period presenting for routine prenatal visit  Plan   Pregnancy3 Problems (from 07/01/19 to present)    Problem Noted Resolved   Short interval between pregnancies affecting pregnancy, antepartum 09/08/2019 by [name], MD No   Labile blood pressure 12/04/2018 by Jimmey Ralph, MD No   Overview Signed 12/04/2018  4:19 PM by Jimmey Ralph, MD    Pt states white coat , fluctuates when she comes for visits Frequently will have systolic BP in 140 range       Obesity affecting pregnancy, antepartum 09/03/2018 by [name], MD No   Overview Signed 12/04/2018  2:38 PM by Jimmey Ralph, MD    BMI 59  Normal early glucola        Supervision of high-risk pregnancy 08/29/2018 by Oswaldo Conroy, CNM No   Overview Addendum 01/12/2020 10:41 AM by [name], MD    [facility]inic [facility] Prenatal Labs  Dating LMP = 11 week [place] Blood type: O negative  Genetic Screen NIPS: Normal XY Inheritest: negative Antibody:Negative  Anatomic [place] Incomplete 7/13 completed by MFM Rubella:Immune Varicella: Immune  GTT Early: HgbA1C 5.5 Third trimester: RPR: NR  Rhogam 01/12/20 HBsAg: negative  TDaP vaccine                       Flu Shot: HIV: negative  Baby Food Bottle                            AVW:[id number]  Contraception Considering BTL Pap:01/08/2018 NIL  Hgb  AA  CS/VBAC Repeat C-section    Support Person              Previous Version   History of cesarean delivery, currently pregnant 08/29/2018 by Oswaldo Conroy, CNM No   Overview Signed 12/04/2018  2:39 PM by Jimmey Ralph, MD    2014 LTCS via [email or web address] in 2014 at [facility] for FTP           Gestational age appropriate  obstetric precautions including but not limited to vaginal bleeding, contractions, leaking of fluid and fetal movement were reviewed in detail with the patient.    1) History of labile BP with first BP in the mild range, repeat normal and asymptomatic - repeat preeclampsia labs today - BP check 1 week   Return in about 1 week (around 02/03/2020) for ROB/BP check 1 week, 2 week ROB and growth scan.  [name], MD, [facility], [facility] 01/27/2020, 12:17 PM

## 2020-01-28 ENCOUNTER — Other Ambulatory Visit: Payer: Self-pay | Admitting: Obstetrics and Gynecology

## 2020-01-28 ENCOUNTER — Other Ambulatory Visit: Payer: Self-pay

## 2020-01-28 LAB — PROTEIN / CREATININE RATIO, URINE
Creatinine, Urine: 213.6 mg/dL
Creatinine, Urine: 216.7 mg/dL
Protein, Ur: 19.5 mg/dL
Protein, Ur: 15 mg/dL
Protein/Creat Ratio: 91 mg/g creat (ref 0–200)
Protein/Creat Ratio: 69 mg/g creat (ref 0–200)

## 2020-01-29 LAB — COMPREHENSIVE METABOLIC PANEL
ALT: 14 IU/L (ref 0–32)
AST: 7 IU/L (ref 0–40)
Albumin/Globulin Ratio: 1.3 (ref 1.2–2.2)
Albumin: 3.4 g/dL — ABNORMAL LOW (ref 3.9–5.0)
Alkaline Phosphatase: 118 IU/L (ref 44–121)
BUN/Creatinine Ratio: 18 (ref 9–23)
BUN: 12 mg/dL (ref 6–20)
Bilirubin Total: <0.2 mg/dL (ref 0.0–1.2)
CO2: 23 mmol/L (ref 20–29)
Calcium: 8.6 mg/dL — ABNORMAL LOW (ref 8.7–10.2)
Chloride: 106 mmol/L (ref 96–106)
Creatinine, Ser: 0.65 mg/dL (ref 0.57–1.00)
GFR calc Af Amer: 140 mL/min/{1.73_m2} (ref >59)
GFR calc non Af Amer: 121 mL/min/{1.73_m2} (ref >59)
Globulin, Total: 2.7 g/dL (ref 1.5–4.5)
Glucose: 78 mg/dL (ref 65–99)
Potassium: 4.7 mmol/L (ref 3.5–5.2)
Sodium: 140 mmol/L (ref 134–144)
Total Protein: 6.1 g/dL (ref 6.0–8.5)

## 2020-01-29 LAB — CBC
Hematocrit: 34.7 % (ref 34.0–46.6)
Hemoglobin: 11.2 g/dL (ref 11.1–15.9)
MCH: 30.3 pg (ref 26.6–33.0)
MCHC: 32.3 g/dL (ref 31.5–35.7)
MCV: 94 fL (ref 79–97)
Platelets: 348 10*3/uL (ref 150–450)
RBC: 3.7 x10E6/uL — ABNORMAL LOW (ref 3.77–5.28)
RDW: 13.3 % (ref 11.7–15.4)
WBC: 10.7 10*3/uL (ref 3.4–10.8)

## 2020-02-03 ENCOUNTER — Other Ambulatory Visit: Payer: Self-pay

## 2020-02-03 ENCOUNTER — Ambulatory Visit (INDEPENDENT_AMBULATORY_CARE_PROVIDER_SITE_OTHER): Payer: Managed Care, Other (non HMO) | Admitting: Obstetrics

## 2020-02-03 VITALS — BP 110/52 | Wt 341.0 lb

## 2020-02-03 DIAGNOSIS — Z3A31 31 weeks gestation of pregnancy: Secondary | ICD-10-CM

## 2020-02-03 DIAGNOSIS — O99210 Obesity complicating pregnancy, unspecified trimester: Secondary | ICD-10-CM

## 2020-02-03 DIAGNOSIS — O0993 Supervision of high risk pregnancy, unspecified, third trimester: Secondary | ICD-10-CM

## 2020-02-03 LAB — POCT URINALYSIS DIPSTICK OB
Glucose, UA: NEGATIVE
POC,PROTEIN,UA: NEGATIVE

## 2020-02-03 NOTE — Progress Notes (Signed)
C/o here for BP ck. rj

## 2020-02-03 NOTE — Progress Notes (Signed)
Routine Prenatal Care Visit  Subjective  Debra Mcdaniel is a 29 y.o. G3P2002 at [redacted]w[redacted]d being seen today for ongoing prenatal care.  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  ----------------------------------------------------------------------------------- Patient reports no complaints.    .  .   [name] denies.  ----------------------------------------------------------------------------------- The following portions of the patient's history were reviewed and updated as appropriate: allergies, current medications, past family history, past medical history, past social history, past surgical history and problem list. Problem list updated.  Objective  Blood pressure (!) 110/52, weight (!) 341 lb (154.7 kg), last menstrual period 07/01/2019, not currently breastfeeding. Pregravid weight 307 lb (139.3 kg) Total Weight Gain 34 lb (15.4 kg) Urinalysis: Urine Protein Negative  Urine Glucose Negative  Fetal Status:           General:  Alert, oriented and cooperative. Patient is in no acute distress.  Skin: Skin is warm and dry. No rash noted.   Cardiovascular: Normal heart rate noted  Respiratory: Normal respiratory effort, no problems with respiration noted  Abdomen: Soft, gravid, appropriate for gestational age.       Pelvic:  Cervical exam deferred        Extremities: Normal range of motion.     Mental  Status: Normal mood and affect. Normal behavior. Normal judgment and thought content.   Assessment   29 y.o. [id number] at [redacted]w[redacted]d by  04/06/2020, by Last Menstrual Period presenting for routine prenatal visit  Plan   Pregnancy3 Problems (from 07/01/19 to present)    Problem Noted Resolved   Short interval between pregnancies affecting pregnancy, antepartum 09/08/2019 by [name], MD No   Labile blood pressure 12/04/2018 by Jimmey Ralph, MD No   Overview Signed 12/04/2018  4:19 PM by Jimmey Ralph, MD    Pt states white coat , fluctuates when she comes for visits Frequently will have systolic BP in 140 range       Obesity affecting pregnancy, antepartum 09/03/2018 by [name], MD No   Overview Signed 12/04/2018  2:38 PM by Jimmey Ralph, MD    BMI 59  Normal early glucola        Supervision of high-risk pregnancy 08/29/2018 by Oswaldo Conroy, CNM No   Overview Addendum 01/12/2020 10:41 AM by [name], MD    Clinic [facility] Prenatal Labs  Dating LMP = 11 week [place] Blood type: O negative  Genetic Screen NIPS: Normal XY Inheritest: negative Antibody:Negative  Anatomic [place] Incomplete 7/13 completed by MFM Rubella:Immune Varicella: Immune  GTT Early: HgbA1C 5.5 Third trimester: RPR: NR  Rhogam 01/12/20 HBsAg: negative  TDaP vaccine                       Flu Shot: HIV: negative  Baby Food Bottle                            TGG:[id number]  Contraception Considering BTL Pap:01/08/2018 NIL  Hgb  AA   CS/VBAC Repeat C-section    Support Person  Previous Version   History of cesarean delivery, currently pregnant 08/29/2018 by Oswaldo Conroy, CNM No   Overview Signed 12/04/2018  2:39 PM by Jimmey Ralph, MD    2014 LTCS via pfannenstiel in 2014 at [facility] for FTP           Preterm labor symptoms and general obstetric precautions including but not limited to vaginal bleeding, contractions, leaking of fluid and fetal movement were  reviewed in detail with the patient. Please refer to After Visit Summary for other counseling recommendations.  We reviewed SXS of HTN in pregnancy all of which she denies today. Still leaning towards a BTL, lathough her sister and mother had problmes after they had BTLs.  No follow-ups on file.  Mirna Mires, CNM  02/03/2020 3:38 PM

## 2020-02-09 ENCOUNTER — Telehealth: Payer: Self-pay

## 2020-02-09 NOTE — Telephone Encounter (Signed)
Marylu Lund calling regarding FMLA on pt. There are certain areas that are not filled out. Plus need additional information.  [phone]

## 2020-02-10 ENCOUNTER — Other Ambulatory Visit: Payer: Self-pay

## 2020-02-10 ENCOUNTER — Ambulatory Visit (INDEPENDENT_AMBULATORY_CARE_PROVIDER_SITE_OTHER): Payer: Managed Care, Other (non HMO) | Admitting: Advanced Practice Midwife

## 2020-02-10 ENCOUNTER — Encounter: Payer: Self-pay | Admitting: Advanced Practice Midwife

## 2020-02-10 ENCOUNTER — Ambulatory Visit (INDEPENDENT_AMBULATORY_CARE_PROVIDER_SITE_OTHER): Payer: Managed Care, Other (non HMO)

## 2020-02-10 VITALS — BP 122/70 | Wt 345.0 lb

## 2020-02-10 DIAGNOSIS — O09893 Supervision of other high risk pregnancies, third trimester: Secondary | ICD-10-CM | POA: Diagnosis not present

## 2020-02-10 DIAGNOSIS — O99210 Obesity complicating pregnancy, unspecified trimester: Secondary | ICD-10-CM

## 2020-02-10 DIAGNOSIS — Z3A32 32 weeks gestation of pregnancy: Secondary | ICD-10-CM

## 2020-02-10 DIAGNOSIS — O34219 Maternal care for unspecified type scar from previous cesarean delivery: Secondary | ICD-10-CM | POA: Diagnosis not present

## 2020-02-10 DIAGNOSIS — O0993 Supervision of high risk pregnancy, unspecified, third trimester: Secondary | ICD-10-CM

## 2020-02-10 DIAGNOSIS — O99213 Obesity complicating pregnancy, third trimester: Secondary | ICD-10-CM

## 2020-02-10 DIAGNOSIS — O09899 Supervision of other high risk pregnancies, unspecified trimester: Secondary | ICD-10-CM

## 2020-02-10 DIAGNOSIS — Z3A31 31 weeks gestation of pregnancy: Secondary | ICD-10-CM

## 2020-02-10 NOTE — Progress Notes (Signed)
U/s today. No vb. No lof.  

## 2020-02-10 NOTE — Progress Notes (Signed)
Routine Prenatal Care Visit  Subjective  Debra Mcdaniel is a 29 y.o. G3P2002 at [redacted]w[redacted]d being seen today for ongoing prenatal care.  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  ----------------------------------------------------------------------------------- Patient reports she is doing well. She is wondering about her c/section date. I will schedule her and let her know the date with Dr Bonney Aid.  She has changed her mind about tubal ligation and plans Mirena IUD. Contractions: Not present. Vag. Bleeding: None.  Movement: Present. Leaking Fluid denies.  ----------------------------------------------------------------------------------- The following portions of the patient's history were reviewed and updated as appropriate: allergies, current medications, past family history, past medical history, past social history, past surgical history and problem list. Problem list updated.  Objective  Blood pressure 122/70, weight (!) 345 lb (156.5 kg), last menstrual period 07/01/2019 Pregravid weight 307 lb (139.3 kg) Total Weight Gain 38 lb (17.2 kg) Urinalysis: Urine Protein    Urine Glucose    Fetal Status: Fetal Heart Rate (bpm): 146   Movement: Present  Presentation: Vertex   Growth: 39.1%, AC 52.4%, 3 pounds 15 ounces, AFI 14.9 cm  General:  Alert, oriented and cooperative. Patient is in no acute distress.  Skin:  Skin is warm and dry. No rash noted.   Cardiovascular: Normal heart rate noted  Respiratory: Normal respiratory effort, no problems with respiration noted  Abdomen: Soft, gravid, appropriate for gestational age. Pain/Pressure: Absent     Pelvic:  Cervical exam deferred        Extremities: Normal range of motion.  Edema: Trace  Mental Status: Normal mood and affect. Normal behavior. Normal judgment and thought content.   Assessment   29 y.o. [id number] at [redacted]w[redacted]d by  04/06/2020, by Last Menstrual Period presenting for routine prenatal visit  Plan   Pregnancy3 Problems (from 07/01/19 to present)    Problem Noted Resolved   Short interval between pregnancies affecting pregnancy, antepartum 09/08/2019 by [name], MD No   Labile blood pressure 12/04/2018 by Jimmey Ralph, MD No   Overview Signed 12/04/2018  4:19 PM by Jimmey Ralph, MD    Pt states white coat , fluctuates when she comes for visits Frequently will have systolic BP in 140 range       Obesity affecting pregnancy, antepartum 09/03/2018 by [name], MD No   Overview Signed 12/04/2018  2:38 PM by Jimmey Ralph, MD    BMI 59  Normal early glucola        Supervision of high-risk pregnancy 08/29/2018 by Oswaldo Conroy, CNM No   Overview Addendum 01/12/2020 10:41 AM by [name], MD    Clinic [facility] Prenatal Labs  Dating LMP = 11 week [place] Blood type: O negative  Genetic Screen NIPS: Normal XY Inheritest: negative Antibody:Negative  Anatomic [place] Incomplete 7/13 completed by MFM Rubella:Immune Varicella: Immune  GTT Early: HgbA1C 5.5 Third trimester: RPR: NR  Rhogam 01/12/20 HBsAg: negative  TDaP vaccine                       Flu Shot: HIV: negative  Baby Food Bottle  EHU:[id number]  Contraception Considering BTL Pap:01/08/2018 NIL  Hgb  AA   CS/VBAC Repeat C-section    Support Person              Previous Version   History of cesarean delivery, currently  pregnant 08/29/2018 by Oswaldo Conroy, CNM No   Overview Signed 12/04/2018  2:39 PM by Jimmey Ralph, MD    2014 LTCS via pfannenstiel in 2014 at [facility] for FTP           Preterm labor symptoms and general obstetric precautions including but not limited to vaginal bleeding, contractions, leaking of fluid and fetal movement were reviewed in detail with the patient.    Return in about 2 weeks (around 02/24/2020) for afi/nst/rob.  Tresea Mall, CNM 02/10/2020 3:06 PM

## 2020-02-12 ENCOUNTER — Telehealth: Payer: Self-pay | Admitting: Obstetrics and Gynecology

## 2020-02-12 NOTE — Telephone Encounter (Signed)
Its 10/12 she will get a call from scheduling

## 2020-02-12 NOTE — Telephone Encounter (Signed)
Waiting on her to refax form, then ammending it and faxing it back

## 2020-02-12 NOTE — Telephone Encounter (Signed)
Patient is calling to follow up on scheduling an C- section. Patient states JEG said she was going to get the process started. Please advise

## 2020-02-16 ENCOUNTER — Other Ambulatory Visit: Payer: Self-pay

## 2020-02-16 ENCOUNTER — Encounter
Admission: RE | Admit: 2020-02-16 | Discharge: 2020-02-16 | Disposition: A | Discharge status: Home / Self Care | Payer: Managed Care, Other (non HMO) | Source: Ambulatory Visit | Attending: Anesthesiology | Admitting: Anesthesiology

## 2020-02-16 NOTE — Consult Note (Signed)
[facility] Anesthesia Consultation  Debra Mcdaniel JQB:[id number] DOB: [birth year] DOA: 02/16/2020 PCP: Barbette Reichmann, MD   Requesting physician: Dr. Bonney Aid Date of consultation: 02/16/20 Reason for consultation: Obesity during pregnancy  CHIEF COMPLAINT:  Obesity during pregnancy  HISTORY OF PRESENT ILLNESS: Debra Mcdaniel  is a 29 y.o. female with a known history of obesity during pregnancy. This is her third pregnancy, 2 prior cesarean deliveries. Denies hx of cardiovascular disease. Denies hx of asthma. Denies personal or family hx of bleeding disorders.   PAST MEDICAL HISTORY:   Past Medical History:  Diagnosis Date  . Anxiety   . BMI 50.0-59.9, adult (HCC)   . Depression   . Headache   . IBS (irritable bowel syndrome)     PAST SURGICAL HISTORY:  Past Surgical History:  Procedure Laterality Date  . CESAREAN SECTION    . CESAREAN SECTION N/A 03/15/2019   Procedure: CESAREAN SECTION;  Surgeon: [name], MD;  Location: [facility] ORS;  Service: Obstetrics;  Laterality: N/A;  . COLONOSCOPY WITH PROPOFOL N/A 07/24/2017   Procedure: COLONOSCOPY WITH PROPOFOL;  Surgeon: Toledo, Boykin Nearing, MD;  Location: [facility] ENDOSCOPY;  Service: Gastroenterology;  Laterality: N/A;  . ESOPHAGOGASTRODUODENOSCOPY (EGD) WITH PROPOFOL N/A 07/24/2017   Procedure: ESOPHAGOGASTRODUODENOSCOPY (EGD) WITH PROPOFOL;  Surgeon: Toledo, Boykin Nearing, MD;  Location: [facility] ENDOSCOPY;  Service: Gastroenterology;  Laterality: N/A;  . MIrena   02/2013    SOCIAL HISTORY:  Social History   Tobacco Use  . Smoking status: Never Smoker  . Smokeless tobacco: Never Used  Substance Use Topics  . Alcohol use: Not Currently    FAMILY HISTORY:  Family History  Problem Relation Age of Onset  . Hypertension Mother   . Hypothyroidism Mother   . Obesity Mother   . Thyroid disease Mother   . Hypertension Maternal Grandmother   . Hypertension Maternal Grandfather   .  Hypertension Paternal Grandmother   . Hypertension Paternal Grandfather   . Vision loss Father   . Breast cancer Neg Hx   . Ovarian cancer Neg Hx     DRUG ALLERGIES:  Allergies  Allergen Reactions  . Nickel Rash    REVIEW OF SYSTEMS:   RESPIRATORY: No cough, shortness of breath, wheezing.  CARDIOVASCULAR: No chest pain, orthopnea, edema.  HEMATOLOGY: No anemia, easy bruising or bleeding SKIN: No rash or lesion. NEUROLOGIC: No tingling, numbness, weakness.  PSYCHIATRY: No anxiety or depression.   MEDICATIONS AT HOME:  Prior to Admission medications   Medication Sig Start Date End Date Taking? Authorizing Provider  buPROPion (WELLBUTRIN XL) 300 MG 24 hr tablet Take 1 tablet (300 mg total) by mouth daily. 07/31/19   [name], MD  Prenatal Vit-Fe Fumarate-FA (MULTIVITAMIN-PRENATAL) 27-0.8 MG TABS tablet Take 1 tablet by mouth daily.    [provider]      PHYSICAL EXAMINATION:   VITAL SIGNS: Last menstrual period 07/01/2019, not currently breastfeeding.  GENERAL:  29 y.o.-year-old patient no acute distress.  HEENT: Head atraumatic, normocephalic. Oropharynx and nasopharynx clear. MP 2, TM distance >3 cm, normal mouth opening. LUNGS: No use of accessory muscles of respiration.   EXTREMITIES: No pedal edema, cyanosis, or clubbing.  NEUROLOGIC: normal gait PSYCHIATRIC: The patient is alert and oriented x 3.  SKIN: No obvious rash, lesion, or ulcer.    IMPRESSION AND PLAN:   Debra Mcdaniel  is a 29 y.o. female presenting with obesity during pregnancy. BMI is currently 63 at [redacted] weeks gestation.   Airway exam reassuring. Spinal interspaces minimally  palpable.   Plan for repeat cesarean delivery at 39 weeks. Discussed plan for spinal anesthesia. Discussed GA if emergency cesarean delivery is required.   Plan for delivery at [facility].

## 2020-02-17 NOTE — Telephone Encounter (Signed)
-----   Message from Tresea Mall, CNM sent at 02/12/2020  4:37 PM EDT ----- No I meant 24th. She is [redacted]w[redacted]d on the 23rd and AMS said no to that day when he is actually in the OR. She needs to be 39 weeks. She is on the book on L&D. SDJ is aware of the surgery also. ----- Message ----- FromLilia Pro H Sent: 02/12/2020  11:53 AM EDT To: Tresea Mall, CNM  DOS 03/30/20 is a Wednesday. Did you mean 11/23?  ----- Message ----- From: Tresea Mall, CNM Sent: 02/11/2020  12:53 PM EDT To: April H Hawley  Surgery Booking Request Patient Full Name:  Debra Mcdaniel  MRN: [id number]  DOB: 12-26-1990  Surgeon: AMS/SDJ Requested Surgery Date and Time: 03/30/20 Primary Diagnosis AND Code: repeat c/section Secondary Diagnosis and Code:  Surgical Procedure: Cesarean Section RNFA Requested?: No L&D Notification: Yes Admission Status: surgery admit Length of Surgery: 60 min Special Case Needs: Yes On Q pump H&P:  Phone Interview???: Interpreter: No Medical Clearance:   Special Scheduling Instructions:  Any known health/anesthesia issues, diabetes, sleep apnea, latex allergy, defibrillator/pacemaker?:  Acuity: P1   (P1 highest, P2 delay may cause harm, P3 low, elective gyn, P4 lowest)

## 2020-02-17 NOTE — Telephone Encounter (Signed)
Called patient to schedule c/s w [facility]  DOS 11/24 @ 8:00am  H&P 11/16 @ 3:50   Covid testing 11/23 @ 9-10am, [facility] [employer], drive up and wear mask. Advised pt to quarantine until DOS.  Pre-admit phone call appointment 11/18 @ 1-5pm  Advised that pt may also receive calls from the hospital pharmacy and pre-service center.  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 No secondary insurance.

## 2020-02-22 ENCOUNTER — Ambulatory Visit (INDEPENDENT_AMBULATORY_CARE_PROVIDER_SITE_OTHER): Payer: Managed Care, Other (non HMO) | Admitting: Obstetrics and Gynecology

## 2020-02-22 ENCOUNTER — Ambulatory Visit (INDEPENDENT_AMBULATORY_CARE_PROVIDER_SITE_OTHER): Payer: Managed Care, Other (non HMO)

## 2020-02-22 ENCOUNTER — Other Ambulatory Visit: Payer: Self-pay

## 2020-02-22 VITALS — BP 122/70 | Ht 62.0 in | Wt 345.4 lb

## 2020-02-22 DIAGNOSIS — O0993 Supervision of high risk pregnancy, unspecified, third trimester: Secondary | ICD-10-CM | POA: Diagnosis not present

## 2020-02-22 DIAGNOSIS — O34219 Maternal care for unspecified type scar from previous cesarean delivery: Secondary | ICD-10-CM

## 2020-02-22 DIAGNOSIS — O99213 Obesity complicating pregnancy, third trimester: Secondary | ICD-10-CM | POA: Diagnosis not present

## 2020-02-22 DIAGNOSIS — Z3A33 33 weeks gestation of pregnancy: Secondary | ICD-10-CM

## 2020-02-22 DIAGNOSIS — Z6791 Unspecified blood type, Rh negative: Secondary | ICD-10-CM

## 2020-02-22 DIAGNOSIS — O99210 Obesity complicating pregnancy, unspecified trimester: Secondary | ICD-10-CM

## 2020-02-22 DIAGNOSIS — R0989 Other specified symptoms and signs involving the circulatory and respiratory systems: Secondary | ICD-10-CM

## 2020-02-22 DIAGNOSIS — O09899 Supervision of other high risk pregnancies, unspecified trimester: Secondary | ICD-10-CM

## 2020-02-22 DIAGNOSIS — O26893 Other specified pregnancy related conditions, third trimester: Secondary | ICD-10-CM

## 2020-02-22 LAB — FETAL NONSTRESS TEST

## 2020-02-22 NOTE — Progress Notes (Signed)
Routine Prenatal Care Visit  Subjective  Debra Mcdaniel is a 29 y.o. G3P2002 at [redacted]w[redacted]d being seen today for ongoing prenatal care.  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  ----------------------------------------------------------------------------------- Patient reports no complaints.   Contractions: Not present. Vag. Bleeding: None.  Movement: Present. Denies leaking of fluid.  ----------------------------------------------------------------------------------- The following portions of the patient's history were reviewed and updated as appropriate: allergies, current medications, past family history, past medical history, past social history, past surgical history and problem list. Problem list updated.   Objective  Blood pressure 122/70, height 5\' 2"  (1.575 m), weight (!) 345 lb 6.4 oz (156.7 kg), last menstrual period 07/01/2019, not currently breastfeeding. Pregravid weight 307 lb (139.3 kg) Total Weight Gain 38 lb 6.4 oz (17.4 kg)  Body mass index is 63.17 kg/m.  Urinalysis:      Fetal Status: Fetal Heart Rate (bpm): 140   Movement: Present     General:  Alert, oriented and cooperative. Patient is in no acute distress.  Skin: Skin is warm and dry. No rash noted.   Cardiovascular: Normal heart rate noted  Respiratory: Normal respiratory effort, no problems with respiration noted  Abdomen: Soft,  gravid, appropriate for gestational age. Pain/Pressure: Absent     Pelvic:  Cervical exam deferred        Extremities: Normal range of motion.     ental Status: Normal mood and affect. Normal behavior. Normal judgment and thought content.   07/03/2019 OB Limited  Result Date: 02/22/2020 Patient Name: Debra Mcdaniel DOB: 1990-07-05 MRN: 1990 ULTRASOUND REPORT Location: [facility] Date of Service: 02/22/2020 Indications:AFI Findings: 02/24/2020 intrauterine pregnancy is visualized with FHR at 131 BPM. Fetal presentation is Cephalic. Placenta: fundal. Grade: 2 AFI: 18.7 cm Impression: 1. [redacted]w[redacted]d Viable Singleton Intrauterine pregnancy dated by previously established criteria. 2. AFI is 18.7 cm. Recommendations: 1.Clinical correlation with the patient's History and Physical Exam. [redacted]w[redacted]d, RT There is a singleton gestation with normal amniotic fluid volume. The visualized fetal anatomy appears within normal limits within the resolution of ultrasound as described above.  It must be noted that a normal ultrasound is unable to rule out fetal aneuploidy.  Deanna Artis, MD, [name], [facility] 02/22/2020, 4:20 PM   02/24/2020 OB Follow Up  Result Date: 02/15/2020 Patient Name: Debra Mcdaniel DOB: 1991/02/16 MRN: 1990 ULTRASOUND REPORT Location: [facility] Date of Service: 02/10/2020 Indications:growth/afi Findings: 04/11/2020 intrauterine pregnancy is visualized with FHR at 146 BPM. Biometrics give an (U/S) Gestational age of [redacted]w[redacted]d and an (U/S) EDD of 04/10/2020; this correlates with the clinically established Estimated Date of Delivery: 04/06/20. Fetal presentation is Cephalic. Placenta: posterior. Grade: 2 AFI: 14.9 cm Growth percentile is 39.1%.  AC percentile is 52.4%. EFW: 1782 g  ( 3 lb 15 oz ) Impression: 1. [redacted]w[redacted]d Viable Singleton Intrauterine pregnancy previously established criteria. 2. Growth is 39.1 %ile.  AFI is 14.9 cm. Recommendations: 1.Clinical correlation  with the patient's History and Physical Exam. [redacted]w[redacted]d, RT Review of ULTRASOUND.    I have personally reviewed images and report  of recent ultrasound done at [facility].    Plan of management to be discussed with patient. Annamarie Major, MD, FACOG [facility], [facility] 02/15/2020  4:35 PM  Baseline: 130 Variability: moderate Accelerations: present Decelerations: absent Tocometry: N/A The patient was monitored for 30 minutes, fetal heart rate tracing was deemed reactive, category I tracing,  Assessment   29 y.o. G3P2002 at [redacted]w[redacted]d by  04/06/2020, by Last Menstrual Period presenting for routine prenatal visit  Plan   Pregnancy3 Problems (from 07/01/19 to present)    Problem Noted Resolved   Short interval between pregnancies affecting pregnancy, antepartum 09/08/2019 by [name], MD No   Labile blood pressure 12/04/2018 by Jimmey Ralph, MD No   Overview Signed 12/04/2018  4:19 PM by Jimmey Ralph, MD    Pt states white coat , fluctuates when she comes for visits Frequently will have systolic BP in 140 range       Obesity affecting pregnancy, antepartum 09/03/2018 by [name], MD No   Overview Signed 12/04/2018  2:38 PM by Jimmey Ralph, MD    BMI 59  Normal early glucola        Supervision of high-risk pregnancy 08/29/2018 by Oswaldo Conroy, CNM No   Overview Addendum 01/12/2020 10:41 AM by [name], MD    Clinic [facility] Prenatal Labs  Dating LMP = 11 week [place] Blood type: O negative  Genetic Screen NIPS: Normal XY Inheritest: negative Antibody:Negative  Anatomic [place] Incomplete 7/13 completed by MFM Rubella:Immune Varicella: Immune  GTT Early: HgbA1C 5.5 Third trimester: RPR: NR  Rhogam 01/12/20 HBsAg: negative  TDaP vaccine                       Flu Shot: HIV: negative  Baby Food Bottle                            [id number]  Contraception Considering BTL Pap:01/08/2018 NIL  Hgb  AA   CS/VBAC Repeat C-section      Support Person              Previous Version   History of cesarean delivery, currently pregnant 08/29/2018 by Oswaldo Conroy, CNM No   Overview Signed 12/04/2018  2:39 PM by Jimmey Ralph, MD    2014 LTCS via pfannenstiel in 2014 at [facility] for FTP           Gestational age appropriate obstetric precautions including but not limited to vaginal bleeding, contractions, leaking of fluid and fetal movement were reviewed in detail with the patient.    Return in about 1 week (around 02/29/2020) for ROB, NST, AFI.  [name], MD, [facility], [facility] 02/22/2020, 4:55 PM

## 2020-03-03 ENCOUNTER — Ambulatory Visit (INDEPENDENT_AMBULATORY_CARE_PROVIDER_SITE_OTHER): Payer: Managed Care, Other (non HMO)

## 2020-03-03 ENCOUNTER — Ambulatory Visit (INDEPENDENT_AMBULATORY_CARE_PROVIDER_SITE_OTHER): Payer: Managed Care, Other (non HMO) | Admitting: Obstetrics

## 2020-03-03 ENCOUNTER — Other Ambulatory Visit: Payer: Self-pay

## 2020-03-03 VITALS — BP 120/62 | Wt 350.0 lb

## 2020-03-03 DIAGNOSIS — O09899 Supervision of other high risk pregnancies, unspecified trimester: Secondary | ICD-10-CM | POA: Diagnosis not present

## 2020-03-03 DIAGNOSIS — Z3A35 35 weeks gestation of pregnancy: Secondary | ICD-10-CM

## 2020-03-03 DIAGNOSIS — O34219 Maternal care for unspecified type scar from previous cesarean delivery: Secondary | ICD-10-CM | POA: Diagnosis not present

## 2020-03-03 DIAGNOSIS — O0993 Supervision of high risk pregnancy, unspecified, third trimester: Secondary | ICD-10-CM

## 2020-03-03 DIAGNOSIS — O0990 Supervision of high risk pregnancy, unspecified, unspecified trimester: Secondary | ICD-10-CM

## 2020-03-03 DIAGNOSIS — O99210 Obesity complicating pregnancy, unspecified trimester: Secondary | ICD-10-CM | POA: Diagnosis not present

## 2020-03-03 LAB — POCT URINALYSIS DIPSTICK OB: POC,PROTEIN,UA: NEGATIVE

## 2020-03-03 LAB — FETAL NONSTRESS TEST

## 2020-03-03 NOTE — Progress Notes (Signed)
No concerns.rj 

## 2020-03-03 NOTE — Progress Notes (Signed)
Routine Prenatal Care Visit  Subjective  Debra Mcdaniel is a 29 y.o. G3P2002 at [redacted]w[redacted]d being seen today for ongoing prenatal care.  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  ----------------------------------------------------------------------------------- Patient reports no complaints.  She had an AFI today and NST. Contractions: Not present. Vag. Bleeding: None.  Movement: Present. Leaking Fluid denies.  ----------------------------------------------------------------------------------- The following portions of the patient's history were reviewed and updated as appropriate: allergies, current medications, past family history, past medical history, past social history, past surgical history and problem list. Problem list updated.  Objective  Last menstrual period 07/01/2019, not currently breastfeeding. Pregravid weight 307 lb (139.3 kg) Total Weight Gain 43 lb (19.5 kg) Urinalysis: Urine Protein Negative  Urine Glucose (!) Trace  Fetal Status: Fetal Heart Rate (bpm): RNST   Movement: Present     General:  Alert, oriented and cooperative. Patient is in no acute distress.  Skin: Skin is warm and dry. No rash noted.   Cardiovascular: Normal heart rate noted  Respiratory: Normal respiratory effort, no problems with respiration noted  Abdomen: Soft, gravid, appropriate for gestational age. Pain/Pressure: Absent      Pelvic:  Cervical exam deferred        Extremities: Normal range of motion.  Edema: None  Mental Status: Normal mood and affect. Normal behavior. Normal judgment and thought content.   AFI today is 15.1 NST is reactive. Assessment   29 y.o. [id number] at [redacted]w[redacted]d by  04/06/2020, by Last Menstrual Period presenting for routine prenatal visit  Plan   Pregnancy3 Problems (from 07/01/19 to present)    Problem Noted Resolved   Short interval between pregnancies affecting pregnancy, antepartum 09/08/2019 by [name], MD No   Labile blood pressure 12/04/2018 by Jimmey Ralph, MD No   Overview Signed 12/04/2018  4:19 PM by Jimmey Ralph, MD    Pt states white coat , fluctuates when she comes for visits Frequently will have systolic BP in 140 range       Obesity affecting pregnancy, antepartum 09/03/2018 by [name], MD No   Overview Signed 12/04/2018  2:38 PM by Jimmey Ralph, MD    BMI 59  Normal early glucola        Supervision of high-risk pregnancy 08/29/2018 by Oswaldo Conroy, CNM No   Overview Addendum 01/12/2020 10:41 AM by [name], MD    Clinic [facility] Prenatal Labs  Dating LMP = 11 week [place] Blood type: O negative  Genetic Screen NIPS: Normal XY Inheritest: negative Antibody:Negative  Anatomic [place] Incomplete 7/13 completed by MFM Rubella:Immune Varicella: Immune  GTT Early: HgbA1C 5.5 Third trimester: RPR: NR  Rhogam 01/12/20 HBsAg: negative  TDaP vaccine                       Flu Shot: HIV: negative  Baby Food Bottle                            [id number]  Contraception Considering BTL Pap:01/08/2018 NIL  Hgb  AA   CS/VBAC Repeat  C-section    Support Person              Previous Version   History of cesarean delivery, currently pregnant 08/29/2018 by Oswaldo Conroy, CNM No   Overview Signed 12/04/2018  2:39 PM by Jimmey Ralph, MD    2014 LTCS via pfannenstiel in 2014 at [facility] for FTP           Term labor symptoms  and general obstetric precautions including but not limited to vaginal bleeding, contractions, leaking of fluid and fetal movement were reviewed in detail with the patient. Please refer to After Visit Summary for other counseling recommendations.  Her repeat CS is set for 03/30/2020 wit Dr. Bonney Aid.  Return in about 1 week (around 03/10/2020) for return OB, NST.  Mirna Mires, CNM  03/03/2020 3:31 PM

## 2020-03-09 ENCOUNTER — Other Ambulatory Visit (HOSPITAL_COMMUNITY)
Admission: RE | Admit: 2020-03-09 | Discharge: 2020-03-09 | Disposition: A | Discharge status: Home / Self Care | Payer: Managed Care, Other (non HMO) | Source: Ambulatory Visit | Attending: Advanced Practice Midwife | Admitting: Advanced Practice Midwife

## 2020-03-09 ENCOUNTER — Other Ambulatory Visit: Payer: Self-pay

## 2020-03-09 ENCOUNTER — Ambulatory Visit (INDEPENDENT_AMBULATORY_CARE_PROVIDER_SITE_OTHER): Payer: Managed Care, Other (non HMO) | Admitting: Advanced Practice Midwife

## 2020-03-09 ENCOUNTER — Encounter: Payer: Self-pay | Admitting: Advanced Practice Midwife

## 2020-03-09 VITALS — BP 126/84 | Wt 354.0 lb

## 2020-03-09 DIAGNOSIS — O99213 Obesity complicating pregnancy, third trimester: Secondary | ICD-10-CM

## 2020-03-09 DIAGNOSIS — Z3A36 36 weeks gestation of pregnancy: Secondary | ICD-10-CM

## 2020-03-09 DIAGNOSIS — Z113 Encounter for screening for infections with a predominantly sexual mode of transmission: Secondary | ICD-10-CM | POA: Diagnosis present

## 2020-03-09 DIAGNOSIS — O0993 Supervision of high risk pregnancy, unspecified, third trimester: Secondary | ICD-10-CM

## 2020-03-09 DIAGNOSIS — Z3685 Encounter for antenatal screening for Streptococcus B: Secondary | ICD-10-CM

## 2020-03-09 LAB — FETAL NONSTRESS TEST

## 2020-03-09 NOTE — Progress Notes (Signed)
NST today. GBS/Aptima today. No vb. No lof.

## 2020-03-09 NOTE — Progress Notes (Signed)
Routine Prenatal Care Visit  Subjective  Debra Mcdaniel is a 29 y.o. G3P2002 at [redacted]w[redacted]d being seen today for ongoing prenatal care.  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  ----------------------------------------------------------------------------------- Patient reports general discomforts of third trimester.   Contractions: Not present. Vag. Bleeding: None.  Movement: Present. Leaking Fluid denies.  ----------------------------------------------------------------------------------- The following portions of the patient's history were reviewed and updated as appropriate: allergies, current medications, past family history, past medical history, past social history, past surgical history and problem list. Problem list updated.  Objective  Blood pressure 126/84, weight (!) 354 lb (160.6 kg), last menstrual period 07/01/2019, not currently breastfeeding. Pregravid weight 307 lb (139.3 kg) Total Weight Gain 47 lb (21.3 kg) Urinalysis: Urine Protein    Urine Glucose    Fetal Status: Fetal Heart Rate (bpm): 140   Movement: Present      NST: reactive 30 minute tracing, 140 bpm moderate variability, +accelerations, -decelerations  General:  Alert, oriented and cooperative. Patient is in no acute distress.  Skin: Skin is warm and dry. No rash noted.   Cardiovascular: Normal heart rate noted  Respiratory: Normal  respiratory effort, no problems with respiration noted  Abdomen: Soft, gravid, appropriate for gestational age. Pain/Pressure: Present     Pelvic:  gbs/aptima collected        Extremities: Normal range of motion.  Edema: Trace  Mental Status: Normal mood and affect. Normal behavior. Normal judgment and thought content.   Assessment   29 y.o. [id number] at [redacted]w[redacted]d by  04/06/2020, by Last Menstrual Period presenting for routine prenatal visit  Plan   Pregnancy3 Problems (from 07/01/19 to present)    Problem Noted Resolved   Short interval between pregnancies affecting pregnancy, antepartum 09/08/2019 by [name], MD No   Labile blood pressure 12/04/2018 by Jimmey Ralph, MD No   Overview Signed 12/04/2018  4:19 PM by Jimmey Ralph, MD    Pt states white coat , fluctuates when she comes for visits Frequently will have systolic BP in 140 range       Obesity affecting pregnancy, antepartum 09/03/2018 by [name], MD No   Overview Signed 12/04/2018  2:38 PM by Jimmey Ralph, MD    BMI 59  Normal early glucola        Supervision of high-risk pregnancy 08/29/2018 by Oswaldo Conroy, CNM No   Overview Addendum 01/12/2020 10:41 AM by [name], MD    [facility] Prenatal Labs  Dating LMP = 11 week [place] Blood type: O negative  Genetic Screen NIPS: Normal XY Inheritest: negative Antibody:Negative  Anatomic [place] Incomplete 7/13 completed by MFM Rubella:Immune Varicella: Immune  GTT Early: HgbA1C 5.5 Third trimester: RPR: NR  Rhogam 01/12/20 HBsAg: negative  TDaP vaccine                       Flu Shot: HIV: negative  Baby Food Bottle                            [id number]  Contraception  Considering BTL Pap:01/08/2018 NIL  Hgb  AA   CS/VBAC Repeat C-section    Support Person              Previous Version   History of cesarean delivery, currently pregnant 08/29/2018 by Oswaldo Conroy, CNM No   Overview Signed 12/04/2018  2:39 PM by Jimmey Ralph, MD    2014 LTCS via [email or web address] in 2014 at [facility] for FTP           Preterm labor symptoms and general obstetric precautions including but not limited to vaginal bleeding, contractions, leaking of fluid and fetal movement were reviewed in detail with the patient.   Return in about 1 week (around 03/16/2020) for afi/nst/rob.  Tresea Mall, CNM 03/09/2020 3:52 PM

## 2020-03-11 LAB — CERVICOVAGINAL ANCILLARY ONLY
Chlamydia: NEGATIVE
Comment: NEGATIVE
Comment: NEGATIVE
Comment: NORMAL
Neisseria Gonorrhea: NEGATIVE
Trichomonas: NEGATIVE

## 2020-03-11 LAB — STREP GP B NAA: Strep Gp B NAA: POSITIVE — AB

## 2020-03-16 ENCOUNTER — Ambulatory Visit: Payer: Managed Care, Other (non HMO)

## 2020-03-16 ENCOUNTER — Encounter: Payer: Managed Care, Other (non HMO) | Admitting: Obstetrics and Gynecology

## 2020-03-16 DIAGNOSIS — O99213 Obesity complicating pregnancy, third trimester: Secondary | ICD-10-CM

## 2020-03-16 DIAGNOSIS — O0993 Supervision of high risk pregnancy, unspecified, third trimester: Secondary | ICD-10-CM

## 2020-03-22 ENCOUNTER — Ambulatory Visit (INDEPENDENT_AMBULATORY_CARE_PROVIDER_SITE_OTHER): Payer: Managed Care, Other (non HMO) | Admitting: Obstetrics and Gynecology

## 2020-03-22 ENCOUNTER — Encounter: Payer: Self-pay | Admitting: Obstetrics and Gynecology

## 2020-03-22 ENCOUNTER — Other Ambulatory Visit: Payer: Self-pay

## 2020-03-22 VITALS — BP 132/72 | Ht 62.0 in | Wt 356.0 lb

## 2020-03-22 DIAGNOSIS — O26893 Other specified pregnancy related conditions, third trimester: Secondary | ICD-10-CM

## 2020-03-22 DIAGNOSIS — Z3A37 37 weeks gestation of pregnancy: Secondary | ICD-10-CM

## 2020-03-22 DIAGNOSIS — O09899 Supervision of other high risk pregnancies, unspecified trimester: Secondary | ICD-10-CM

## 2020-03-22 DIAGNOSIS — O99210 Obesity complicating pregnancy, unspecified trimester: Secondary | ICD-10-CM

## 2020-03-22 DIAGNOSIS — O34219 Maternal care for unspecified type scar from previous cesarean delivery: Secondary | ICD-10-CM

## 2020-03-22 DIAGNOSIS — O0993 Supervision of high risk pregnancy, unspecified, third trimester: Secondary | ICD-10-CM

## 2020-03-22 DIAGNOSIS — Z6791 Unspecified blood type, Rh negative: Secondary | ICD-10-CM

## 2020-03-22 LAB — POCT URINALYSIS DIPSTICK OB
Glucose, UA: NEGATIVE
POC,PROTEIN,UA: NEGATIVE

## 2020-03-22 NOTE — H&P (View-Only) (Signed)
Obstetric H&P   Chief Complaint: ROB  Prenatal Care Provider: [facility]  History of Present Illness: 29 y.o. [id number] [redacted]w[redacted]d by 04/06/2020, by Last Menstrual Period presenting for ROB and C-section consents.  The patient reports +FM, no LOF, no VB, no regular contractions.  This pregnancy is complicated by a history of 2 prior cesarean section as well as obesity Body mass index is 65.11 kg/m.   Pregravid weight 307 lb (139.3 kg) Total Weight Gain 47 lb (21.3 kg)  Pregnancy3 Problems (from 07/01/19 to present)    Problem Noted Resolved   Short interval between pregnancies affecting pregnancy, antepartum 09/08/2019 by [name], MD No   Labile blood pressure 12/04/2018 by Jimmey Ralph, MD No   Overview Signed 12/04/2018  4:19 PM by Jimmey Ralph, MD    Pt states white coat , fluctuates when she comes for visits Frequently will have systolic BP in 140 range       Obesity affecting pregnancy, antepartum 09/03/2018 by [name], MD No   Overview Signed 12/04/2018  2:38 PM by Jimmey Ralph, MD    BMI 59  Normal early glucola        Supervision of high-risk pregnancy 08/29/2018 by Oswaldo Conroy, CNM No   Overview Addendum 01/12/2020 10:41 AM by [name], MD    [facility] Prenatal Labs  Dating LMP = 11 week [place] Blood type: O negative  Genetic Screen NIPS: Normal XY Inheritest: negative Antibody:Negative  Anatomic [place] Incomplete 7/13 completed by MFM Rubella:Immune Varicella: Immune  GTT Early: HgbA1C 5.5 Third trimester: RPR: NR  Rhogam 01/12/20 HBsAg: negative  TDaP vaccine                       Flu Shot: HIV: negative  Baby Food Bottle                            [id number]  Contraception Considering BTL Pap:01/08/2018 NIL  Hgb  AA   CS/VBAC Repeat C-section    Support Person              Previous Version   History of cesarean delivery, currently pregnant 08/29/2018 by Oswaldo Conroy, CNM No   Overview Signed 12/04/2018  2:39 PM  by Jimmey Ralph, MD    2014 LTCS via [email or web address] in 2014 at [facility] for FTP           Review of Systems: 10 point review of systems negative unless otherwise noted in HPI  Past Medical History: Patient Active Problem List   Diagnosis Date Noted  . Short interval between pregnancies affecting pregnancy, antepartum 09/08/2019  . Uterine scar from previous cesarean delivery affecting pregnancy 03/15/2019  . Labile blood pressure 12/04/2018    Pt states white coat , fluctuates when she comes for visits Frequently will have systolic BP in 140 range    . Obesity affecting pregnancy, antepartum 09/03/2018    BMI 59  Normal early glucola     . Supervision of high-risk pregnancy 08/29/2018    [facility] Prenatal Labs  Dating LMP = 11 week [place] Blood type: O negative  Genetic Screen NIPS: Normal XY Inheritest: negative Antibody:Negative  Anatomic [place] Incomplete 7/13 completed by MFM Rubella:Immune Varicella: Immune  GTT Early: HgbA1C 5.5 Third trimester: RPR: NR  Rhogam 01/12/20 HBsAg: negative  TDaP vaccine  Flu Shot: HIV: negative  Baby Food Bottle                            [id number]:positive  Contraception Considering BTL Pap:01/08/2018 NIL  Hgb  AA   CS/VBAC Repeat C-section    Support Person           . BMI 50.0-59.9, adult (HCC) 08/29/2018    BMI >=40 [ ]  early 1h gtt -  [ ]  u/s for dating [ ]   [ ]  nutritional goals [ ]  folic acid 1mg  [ ]  bASA (>12 weeks) [ ]  consider nutrition consult [ ]  consider maternal EKG 1st trimester [ ]  Growth u/s 28 [ ] , 32 [ ] , 36 weeks [ ]  [ ]  NST/AFI weekly 36+ weeks (36[] , 37[] , 38[] , 39[] , 40[] ) [ ]  IOL by 41 weeks (scheduled, prn [] )   . History of cesarean delivery, currently pregnant 08/29/2018    2014 [facility] pfannenstiel [facility]    . GAD (generalized anxiety disorder) 11/15/2017    Patient has difficulty getting over things impacts her daily life affects her GI disturbances and her HA  s Offered Zoloft    . Headache disorder 11/15/2017    Offered referral to neurology  Reports normal ophthalmology exam annually    . Intractable chronic migraine without aura and without status migrainosus 09/04/2017  . Irritable bowel syndrome with diarrhea 05/17/2017    Negative upper GI and colonoscopy in 2019    . Rh negative status during pregnancy 08/31/2012    Overview:  O neg - needs RhoGam     Past Surgical History: Past Surgical History:  Procedure Laterality Date  . CESAREAN SECTION    . CESAREAN SECTION N/A 03/15/2019   Procedure: CESAREAN SECTION;  Surgeon: Nadara MustardHarris, Robert P, MD;  Location: [facility] ORS;  Service: Obstetrics;  Laterality: N/A;  . COLONOSCOPY WITH PROPOFOL N/A 07/24/2017   Procedure: COLONOSCOPY WITH PROPOFOL;  Surgeon: Toledo, Boykin Nearingeodoro K, MD;  Location: [facility] ENDOSCOPY;  Service: Gastroenterology;  Laterality: N/A;  . ESOPHAGOGASTRODUODENOSCOPY (EGD) WITH PROPOFOL N/A 07/24/2017   Procedure: ESOPHAGOGASTRODUODENOSCOPY (EGD) WITH PROPOFOL;  Surgeon: Toledo, Boykin Nearingeodoro K, MD;  Location: [facility] ENDOSCOPY;  Service: Gastroenterology;  Laterality: N/A;  . MIrena   02/2013    Past Obstetric History: # 1 - Date: 12/22/12, Sex: Female, Weight: 8 lb 10 oz (3.912 kg), GA: None, Delivery: C-Section, Low Transverse, Apgar1: None, Apgar5: None, Living: Living, Birth Comments: None  # 2 - Date: 03/15/19, Sex: Female, Weight: 7 lb 15 oz (3.6 kg), GA: 5344w1d, Delivery: C-Section, Low Transverse, Apgar1: 8, Apgar5: 9, Living: Living, Birth Comments: umbilical hernia  # 3 - Date: None, Sex: None, Weight: None, GA: None, Delivery: None, Apgar1: None, Apgar5: None, Living: None, Birth Comments: None  Family History: Family History  Problem Relation Age of Onset  . Hypertension Mother   . Hypothyroidism Mother   . Obesity Mother   . Thyroid disease Mother   . Hypertension Maternal Grandmother   . Hypertension Maternal Grandfather   . Hypertension Paternal Grandmother   .  Hypertension Paternal Grandfather   . Vision loss Father   . Breast cancer Neg Hx   . Ovarian cancer Neg Hx     Social History: Social History   Socioeconomic History  . Marital status: Married    Spouse name: Not on file  . Number of children: Not on file  . Years of education: Not on file  .  Highest education level: Not on file  Occupational History  . Occupation: [employer]  . Smoking status: Never Smoker  . Smokeless tobacco: Never Used  Vaping Use  . Vaping Use: Never used  Substance and Sexual Activity  . Alcohol use: Not Currently  . Drug use: No  . Sexual activity: Yes    Birth control/protection: None  Other Topics Concern  . Not on file  Social History Narrative   ** Merged History Encounter **       Social Determinants of Health   Financial Resource Strain:   . Difficulty of Paying Living Expenses: Not on file  Food Insecurity:   . Worried About Programme researcher, broadcasting/film/video in the Last Year: Not on file  . Ran Out of Food in the Last Year: Not on file  Transportation Needs:   . Lack of Transportation (Medical): Not on file  . Lack of Transportation (Non-Medical): Not on file  Physical Activity:   . Days of Exercise per Week: Not on file  . Minutes of Exercise per Session: Not on file  Stress:   . Feeling of Stress : Not on file  Social Connections:   . Frequency of Communication with Friends and Family: Not on file  . Frequency of Social Gatherings with Friends and Family: Not on file  . Attends Religious Services: Not on file  . Active Member of Clubs or Organizations: Not on file  . Attends Banker Meetings: Not on file  . Marital Status: Not on file  Intimate Partner Violence:   . Fear of Current or Ex-Partner: Not on file  . Emotionally Abused: Not on file  . Physically Abused: Not on file  . Sexually Abused: Not on file    Medications: Prior to Admission medications   Medication Sig Start Date End Date Taking? Authorizing  Provider  acetaminophen (TYLENOL) 500 MG tablet Take 1,000 mg by mouth every 6 (six) hours as needed for moderate pain or headache.   Yes [provider]  buPROPion (WELLBUTRIN XL) 300 MG 24 hr tablet Take 1 tablet (300 mg total) by mouth daily. 07/31/19  Yes [name], MD  Prenatal Vit-Fe Fumarate-FA (PRENATAL PO) Take 1 tablet by mouth daily.   Yes [provider]    Allergies: Allergies  Allergen Reactions  . Nickel Rash    Physical Exam: Vitals: Blood pressure 132/72, height 5\' 2"  (1.575 m), weight (!) 356 lb (161.5 kg), last menstrual period 07/01/2019, unknown if currently breastfeeding.  Urine Dip Protein: N/A  FHT: 140, moderate, +accels, decels  General: NAD HEENT: normocephalic, anicteric Pulmonary: No increased work of breathing Cardiovascular: RRR, distal pulses 2+ Abdomen: Gravid, non-tender Leopolds: vtx Genitourinary: deferred Extremities: no edema, erythema, or tenderness Neurologic: Grossly intact Psychiatric: mood appropriate, affect full  Labs: Results for orders placed or performed in visit on 03/22/20 (from the past 24 hour(s))  POC Urinalysis Dipstick OB     Status: None   Collection Time: 03/22/20  4:06 PM  Result Value Ref Range   Color, UA     Clarity, UA     Glucose, UA Negative Negative   Bilirubin, UA     Ketones, UA     Spec Grav, UA     Blood, UA     pH, UA     POC,PROTEIN,UA Negative Negative, Trace, Small (1+), Moderate (2+), Large (3+), 4+   Urobilinogen, UA     Nitrite, UA     Leukocytes, UA  Appearance     Odor      Assessment: 29 y.o. [id number] [redacted]w[redacted]d by 04/06/2020, by Last Menstrual Period presenting for ROB and C-section consents  Plan: 1) The patient was counseled regarding risk and benefits to proceeding with Cesarean section to expedite delivery.  Risk of cesarean section were discussed including risk of bleeding and need for potential intraoperative or postoperative blood transfusion with a rate of  approximately 5% quoted for all Cesarean sections, risk of injury to adjacent organs including but not limited to bowl and bladder, the need for additional surgical procedures to address such injuries, and the risk of infection.   - discussed postoperative Lovenox given BMI - will plan on provena placement to decrease seroma risk    2) Fetus - normal APT throughout third trimester  3) PNL - Blood type O/Negative/-- (05/04 1551) / Anti-bodyscreen Negative (05/04 1551) / Rubella 3.13 (05/04 1551) / Varicella Immune / RPR Non Reactive (09/07 1055) / HBsAg Negative (05/04 1551) / HIV Non Reactive (09/07 1055) / 1-hr OGTT 123 / GBS Positive/-- (11/03 1642)  4) Immunization History -  Immunization History  Administered Date(s) Administered  . Tdap 01/27/2020    5) Disposition -   [name], MD, [facility], [facility] 03/22/2020, 4:12 PM

## 2020-03-22 NOTE — Progress Notes (Signed)
PRE OP VISIT C SECTION.

## 2020-03-22 NOTE — Progress Notes (Signed)
Obstetric H&P   Chief Complaint: ROB  Prenatal Care Provider: [facility]  History of Present Illness: 29 y.o. [id number] [redacted]w[redacted]d by 04/06/2020, by Last Menstrual Period presenting for ROB and C-section consents.  The patient reports +FM, no LOF, no VB, no regular contractions.  This pregnancy is complicated by a history of 2 prior cesarean section as well as obesity Body mass index is 65.11 kg/m.   Pregravid weight 307 lb (139.3 kg) Total Weight Gain 47 lb (21.3 kg)  Pregnancy3 Problems (from 07/01/19 to present)    Problem Noted Resolved   Short interval between pregnancies affecting pregnancy, antepartum 09/08/2019 by [name], MD No   Labile blood pressure 12/04/2018 by Jimmey Ralph, MD No   Overview Signed 12/04/2018  4:19 PM by Jimmey Ralph, MD    Pt states white coat , fluctuates when she comes for visits Frequently will have systolic BP in 140 range       Obesity affecting pregnancy, antepartum 09/03/2018 by [name], MD No   Overview Signed 12/04/2018  2:38 PM by Jimmey Ralph, MD    BMI 59  Normal early glucola        Supervision of high-risk pregnancy 08/29/2018 by Oswaldo Conroy, CNM No   Overview Addendum 01/12/2020 10:41 AM by [name], MD    [facility] Prenatal Labs  Dating LMP = 11 week [place] Blood type: O negative  Genetic Screen NIPS: Normal XY Inheritest: negative Antibody:Negative  Anatomic [place] Incomplete 7/13 completed by MFM Rubella:Immune Varicella: Immune  GTT Early: HgbA1C 5.5 Third trimester: RPR: NR  Rhogam 01/12/20 HBsAg: negative  TDaP vaccine                       Flu Shot: HIV: negative  Baby Food Bottle                            HWE:[id number]  Contraception Considering BTL Pap:01/08/2018 NIL  Hgb  AA   CS/VBAC Repeat C-section    Support Person              Previous Version   History of cesarean delivery, currently pregnant 08/29/2018 by Oswaldo Conroy, CNM No   Overview Signed 12/04/2018  2:39 PM  by Jimmey Ralph, MD    2014 LTCS via pfannenstiel in 2014 at [facility] for FTP           Review of Systems: 10 point review of systems negative unless otherwise noted in HPI  Past Medical History: Patient Active Problem List   Diagnosis Date Noted  . Short interval between pregnancies affecting pregnancy, antepartum 09/08/2019  . Uterine scar from previous cesarean delivery affecting pregnancy 03/15/2019  . Labile blood pressure 12/04/2018    Pt states white coat , fluctuates when she comes for visits Frequently will have systolic BP in 140 range    . Obesity affecting pregnancy, antepartum 09/03/2018    BMI 59  Normal early glucola     . Supervision of high-risk pregnancy 08/29/2018    [facility] Prenatal Labs  Dating LMP = 11 week [place] Blood type: O negative  Genetic Screen NIPS: Normal XY Inheritest: negative Antibody:Negative  Anatomic [place] Incomplete 7/13 completed by MFM Rubella:Immune Varicella: Immune  GTT Early: HgbA1C 5.5 Third trimester: RPR: NR  Rhogam 01/12/20 HBsAg: negative  TDaP vaccine  Flu Shot: HIV: negative  Baby Food Bottle                            GBS:positive  Contraception Considering BTL Pap:01/08/2018 NIL  Hgb  AA   CS/VBAC Repeat C-section    Support Person           . BMI 50.0-59.9, adult (HCC) 08/29/2018    BMI >=40 [ ] early 1h gtt -  [ ] u/s for dating [ ]  [ ] nutritional goals [ ] folic acid 1mg [ ] bASA (>12 weeks) [ ] consider nutrition consult [ ] consider maternal EKG 1st trimester [ ] Growth u/s 28 [ ], 32 [ ], 36 weeks [ ] [ ] NST/AFI weekly 36+ weeks (36[], 37[], 38[], 39[], 40[]) [ ] IOL by 41 weeks (scheduled, prn [])   . History of cesarean delivery, currently pregnant 08/29/2018    2014 LTCS via pfannenstiel in 2014 at [facility] for FTP    . GAD (generalized anxiety disorder) 11/15/2017    Patient has difficulty getting over things impacts her daily life affects her GI disturbances and her HA  s Offered Zoloft    . Headache disorder 11/15/2017    Offered referral to neurology  Reports normal ophthalmology exam annually    . Intractable chronic migraine without aura and without status migrainosus 09/04/2017  . Irritable bowel syndrome with diarrhea 05/17/2017    Negative upper GI and colonoscopy in 2019    . Rh negative status during pregnancy 08/31/2012    Overview:  O neg - needs RhoGam     Past Surgical History: Past Surgical History:  Procedure Laterality Date  . CESAREAN SECTION    . CESAREAN SECTION N/A 03/15/2019   Procedure: CESAREAN SECTION;  Surgeon: Harris, Robert P, MD;  Location: [facility] ORS;  Service: Obstetrics;  Laterality: N/A;  . COLONOSCOPY WITH PROPOFOL N/A 07/24/2017   Procedure: COLONOSCOPY WITH PROPOFOL;  Surgeon: Toledo, Teodoro K, MD;  Location: [facility] ENDOSCOPY;  Service: Gastroenterology;  Laterality: N/A;  . ESOPHAGOGASTRODUODENOSCOPY (EGD) WITH PROPOFOL N/A 07/24/2017   Procedure: ESOPHAGOGASTRODUODENOSCOPY (EGD) WITH PROPOFOL;  Surgeon: Toledo, Teodoro K, MD;  Location: [facility] ENDOSCOPY;  Service: Gastroenterology;  Laterality: N/A;  . MIrena   02/2013    Past Obstetric History: # 1 - Date: 12/22/12, Sex: Female, Weight: 8 lb 10 oz (3.912 kg), GA: None, Delivery: C-Section, Low Transverse, Apgar1: None, Apgar5: None, Living: Living, Birth Comments: None  # 2 - Date: 03/15/19, Sex: Female, Weight: 7 lb 15 oz (3.6 kg), GA: [redacted]w[redacted]d, Delivery: C-Section, Low Transverse, Apgar1: 8, Apgar5: 9, Living: Living, Birth Comments: umbilical hernia  # 3 - Date: None, Sex: None, Weight: None, GA: None, Delivery: None, Apgar1: None, Apgar5: None, Living: None, Birth Comments: None  Family History: Family History  Problem Relation Age of Onset  . Hypertension Mother   . Hypothyroidism Mother   . Obesity Mother   . Thyroid disease Mother   . Hypertension Maternal Grandmother   . Hypertension Maternal Grandfather   . Hypertension Paternal Grandmother   .  Hypertension Paternal Grandfather   . Vision loss Father   . Breast cancer Neg Hx   . Ovarian cancer Neg Hx     Social History: Social History   Socioeconomic History  . Marital status: Married    Spouse name: Not on file  . Number of children: Not on file  . Years of education: Not on file  .   Highest education level: Not on file  Occupational History  . Occupation: [employer]  . Smoking status: Never Smoker  . Smokeless tobacco: Never Used  Vaping Use  . Vaping Use: Never used  Substance and Sexual Activity  . Alcohol use: Not Currently  . Drug use: No  . Sexual activity: Yes    Birth control/protection: None  Other Topics Concern  . Not on file  Social History Narrative   ** Merged History Encounter **       Social Determinants of Health   Financial Resource Strain:   . Difficulty of Paying Living Expenses: Not on file  Food Insecurity:   . Worried About Programme researcher, broadcasting/film/video in the Last Year: Not on file  . Ran Out of Food in the Last Year: Not on file  Transportation Needs:   . Lack of Transportation (Medical): Not on file  . Lack of Transportation (Non-Medical): Not on file  Physical Activity:   . Days of Exercise per Week: Not on file  . Minutes of Exercise per Session: Not on file  Stress:   . Feeling of Stress : Not on file  Social Connections:   . Frequency of Communication with Friends and Family: Not on file  . Frequency of Social Gatherings with Friends and Family: Not on file  . Attends Religious Services: Not on file  . Active Member of Clubs or Organizations: Not on file  . Attends Banker Meetings: Not on file  . Marital Status: Not on file  Intimate Partner Violence:   . Fear of Current or Ex-Partner: Not on file  . Emotionally Abused: Not on file  . Physically Abused: Not on file  . Sexually Abused: Not on file    Medications: Prior to Admission medications   Medication Sig Start Date End Date Taking? Authorizing  Provider  acetaminophen (TYLENOL) 500 MG tablet Take 1,000 mg by mouth every 6 (six) hours as needed for moderate pain or headache.   Yes [provider]  buPROPion (WELLBUTRIN XL) 300 MG 24 hr tablet Take 1 tablet (300 mg total) by mouth daily. 07/31/19  Yes [name], MD  Prenatal Vit-Fe Fumarate-FA (PRENATAL PO) Take 1 tablet by mouth daily.   Yes [provider]    Allergies: Allergies  Allergen Reactions  . Nickel Rash    Physical Exam: Vitals: Blood pressure 132/72, height 5\' 2"  (1.575 m), weight (!) 356 lb (161.5 kg), last menstrual period 07/01/2019, unknown if currently breastfeeding.  Urine Dip Protein: N/A  FHT: 140, moderate, +accels, decels  General: NAD HEENT: normocephalic, anicteric Pulmonary: No increased work of breathing Cardiovascular: RRR, distal pulses 2+ Abdomen: Gravid, non-tender Leopolds: vtx Genitourinary: deferred Extremities: no edema, erythema, or tenderness Neurologic: Grossly intact Psychiatric: mood appropriate, affect full  Labs: Results for orders placed or performed in visit on 03/22/20 (from the past 24 hour(s))  POC Urinalysis Dipstick OB     Status: None   Collection Time: 03/22/20  4:06 PM  Result Value Ref Range   Color, UA     Clarity, UA     Glucose, UA Negative Negative   Bilirubin, UA     Ketones, UA     Spec Grav, UA     Blood, UA     pH, UA     POC,PROTEIN,UA Negative Negative, Trace, Small (1+), Moderate (2+), Large (3+), 4+   Urobilinogen, UA     Nitrite, UA     Leukocytes, UA  Appearance     Odor      Assessment: 29 y.o. [id number] [redacted]w[redacted]d by 04/06/2020, by Last Menstrual Period presenting for ROB and C-section consents  Plan: 1) The patient was counseled regarding risk and benefits to proceeding with Cesarean section to expedite delivery.  Risk of cesarean section were discussed including risk of bleeding and need for potential intraoperative or postoperative blood transfusion with a rate of  approximately 5% quoted for all Cesarean sections, risk of injury to adjacent organs including but not limited to bowl and bladder, the need for additional surgical procedures to address such injuries, and the risk of infection.   - discussed postoperative Lovenox given BMI - will plan on provena placement to decrease seroma risk    2) Fetus - normal APT throughout third trimester  3) PNL - Blood type O/Negative/-- (05/04 1551) / Anti-bodyscreen Negative (05/04 1551) / Rubella 3.13 (05/04 1551) / Varicella Immune / RPR Non Reactive (09/07 1055) / HBsAg Negative (05/04 1551) / HIV Non Reactive (09/07 1055) / 1-hr OGTT 123 / GBS Positive/-- (11/03 1642)  4) Immunization History -  Immunization History  Administered Date(s) Administered  . Tdap 01/27/2020    5) Disposition -   [name], MD, [facility], [facility] 03/22/2020, 4:12 PM

## 2020-03-24 ENCOUNTER — Encounter
Admission: RE | Admit: 2020-03-24 | Discharge: 2020-03-24 | Disposition: A | Discharge status: Home / Self Care | Payer: Managed Care, Other (non HMO) | Source: Ambulatory Visit | Attending: Obstetrics and Gynecology | Admitting: Obstetrics and Gynecology

## 2020-03-24 DIAGNOSIS — Z01812 Encounter for preprocedural laboratory examination: Secondary | ICD-10-CM | POA: Diagnosis not present

## 2020-03-24 HISTORY — DX: Other complications of anesthesia, initial encounter: T88.59XA

## 2020-03-24 HISTORY — DX: Nausea with vomiting, unspecified: R11.2

## 2020-03-24 HISTORY — DX: Other specified postprocedural states: Z98.890

## 2020-03-24 HISTORY — DX: Family history of other specified conditions: Z84.89

## 2020-03-24 NOTE — Patient Instructions (Addendum)
Your procedure is scheduled on:03/30/20-  Wednesday Report to the Registration Desk on the 1st floor of the Medical Mall. You are to arrive at Medical mall at 6:00 am on 03/30/20  REMEMBER: Instructions that are not followed completely may result in serious medical risk, up to and including death; or upon the discretion of your surgeon and anesthesiologist your surgery may need to be rescheduled.  Do not eat food after midnight the night before surgery.  No gum chewing, lozengers or hard candies.  You may however, drink CLEAR liquids up to 2 hours before you are scheduled to arrive for your surgery. Do not drink anything within 2 hours of your scheduled arrival time.  Clear liquids include: - water  - apple juice without pulp - gatorade (not RED, PURPLE, OR BLUE) - black coffee or tea (Do NOT add milk or creamers to the coffee or tea) Do NOT drink anything that is not on this list.  TAKE THESE MEDICATIONS THE MORNING OF SURGERY WITH A SIP OF WATER: - acetaminophen (TYLENOL) 500 MG tablet if needed.  One week prior to surgery: Stop Anti-inflammatories (NSAIDS) such as Advil, Aleve, Ibuprofen, Motrin, Naproxen, Naprosyn and Aspirin based products such as Excedrin, Goodys Powder, BC Powder.   Stop ANY OVER THE COUNTER supplements until after surgery. (However, you may continue taking Vitamin D, Vitamin B, and multivitamin up until the day before surgery.)  No Alcohol for 24 hours before or after surgery.  No Smoking including e-cigarettes for 24 hours prior to surgery.  No chewable tobacco products for at least 6 hours prior to surgery.  No nicotine patches on the day of surgery.  Do not use any "recreational" drugs for at least a week prior to your surgery.  Please be advised that the combination of cocaine and anesthesia may have negative outcomes, up to and including death. If you test positive for cocaine, your surgery will be cancelled.  On the morning of surgery brush your  teeth with toothpaste and water, you may rinse your mouth with mouthwash if you wish. Do not swallow any toothpaste or mouthwash.  Do not wear jewelry, make-up, hairpins, clips or nail polish.  Do not wear lotions, powders, or perfumes.   Do not shave body from the neck down 48 hours prior to surgery just in case you cut yourself which could leave a site for infection.  Also, freshly shaved skin may become irritated if using the CHG soap.  Contact lenses, hearing aids and dentures may not be worn into surgery.  Do not bring valuables to the hospital. [facility] is not responsible for any missing/lost belongings or valuables.   Use CHG Soap or wipes as directed on instruction sheet. Contact PCP pertaining to concerns about sensitive skin before using this product.  Notify your doctor if there is any change in your medical condition (cold, fever, infection).  Wear comfortable clothing (specific to your surgery type) to the hospital.  Plan for stool softeners for home use; pain medications have a tendency to cause constipation. You can also help prevent constipation by eating foods high in fiber such as fruits and vegetables and drinking plenty of fluids as your diet allows.  After surgery, you can help prevent lung complications by doing breathing exercises.  Take deep breaths and cough every 1-2 hours. Your doctor may order a device called an Incentive Spirometer to help you take deep breaths. When coughing or sneezing, hold a pillow firmly against your incision with both hands. This  is called "splinting." Doing this helps protect your incision. It also decreases belly discomfort.  If you are being admitted to the hospital overnight, leave your suitcase in the car. After surgery it may be brought to your room.  If you are being discharged the day of surgery, you will not be allowed to drive home. You will need a responsible adult (18 years or older) to drive you home and stay with you  that night.   If you are taking public transportation, you will need to have a responsible adult (18 years or older) with you. Please confirm with your physician that it is acceptable to use public transportation.   Please call the Pre-admissions Testing Dept. at [phone] if you have any questions about these instructions.  Visitation Policy:  Patients undergoing a surgery or procedure may have one family member or support person with them as long as that person is not COVID-19 positive or experiencing its symptoms.  That person may remain in the waiting area during the procedure.  Inpatient Visitation Update:   In an effort to ensure the safety of our team members and our patients, we are implementing a change to our visitation policy:  Effective Monday, Aug. 9, at 7 a.m., inpatients will be allowed one support person.  o The support person may change daily.  o The support person must pass our screening, gel in and out, and wear a mask at all times, including in the patient's room.  o Patients must also wear a mask when staff or their support person are in the room.  o Masking is required regardless of vaccination status.  Systemwide, no visitors 17 or younger.

## 2020-03-28 ENCOUNTER — Telehealth: Payer: Self-pay | Admitting: Obstetrics and Gynecology

## 2020-03-28 NOTE — Telephone Encounter (Signed)
Pt called requesting to speak with nurse. She has questions regarding her C section.

## 2020-03-29 ENCOUNTER — Other Ambulatory Visit: Payer: Self-pay

## 2020-03-29 ENCOUNTER — Other Ambulatory Visit
Admission: RE | Admit: 2020-03-29 | Discharge: 2020-03-29 | Disposition: A | Discharge status: Home / Self Care | Payer: Managed Care, Other (non HMO) | Source: Ambulatory Visit | Attending: Obstetrics and Gynecology | Admitting: Obstetrics and Gynecology

## 2020-03-29 DIAGNOSIS — Z20822 Contact with and (suspected) exposure to covid-19: Secondary | ICD-10-CM | POA: Insufficient documentation

## 2020-03-29 DIAGNOSIS — Z01812 Encounter for preprocedural laboratory examination: Secondary | ICD-10-CM | POA: Insufficient documentation

## 2020-03-29 LAB — TYPE AND SCREEN
ABO/RH(D): O NEG
Antibody Screen: NEGATIVE
Extend sample reason: UNDETERMINED

## 2020-03-29 LAB — CBC
HCT: 36.5 % (ref 36.0–46.0)
Hemoglobin: 11.6 g/dL — ABNORMAL LOW (ref 12.0–15.0)
MCH: 29.3 pg (ref 26.0–34.0)
MCHC: 31.8 g/dL (ref 30.0–36.0)
MCV: 92.2 fL (ref 80.0–100.0)
Platelets: 303 10*3/uL (ref 150–400)
RBC: 3.96 MIL/uL (ref 3.87–5.11)
RDW: 14.5 % (ref 11.5–15.5)
WBC: 6.8 10*3/uL (ref 4.0–10.5)
nRBC: 0 % (ref 0.0–0.2)

## 2020-03-29 LAB — BASIC METABOLIC PANEL
Anion gap: 10 (ref 5–15)
BUN: 10 mg/dL (ref 6–20)
CO2: 22 mmol/L (ref 22–32)
Calcium: 8.6 mg/dL — ABNORMAL LOW (ref 8.9–10.3)
Chloride: 105 mmol/L (ref 98–111)
Creatinine, Ser: 0.74 mg/dL (ref 0.44–1.00)
GFR, Estimated: >60 mL/min (ref >60)
Glucose, Bld: 149 mg/dL — ABNORMAL HIGH (ref 70–99)
Potassium: 3.6 mmol/L (ref 3.5–5.1)
Sodium: 137 mmol/L (ref 135–145)

## 2020-03-29 LAB — SARS CORONAVIRUS 2 (TAT 6-24 HRS): SARS Coronavirus 2: NEGATIVE

## 2020-03-30 ENCOUNTER — Encounter
Admission: RE | Disposition: A | Discharge status: Home / Self Care | Payer: Self-pay | Source: Home / Self Care | Attending: Obstetrics and Gynecology

## 2020-03-30 ENCOUNTER — Inpatient Hospital Stay
Admission: RE | Admit: 2020-03-30 | Discharge: 2020-04-01 | DRG: 787 | Disposition: A | Discharge status: Home / Self Care | Payer: Managed Care, Other (non HMO) | Attending: Obstetrics and Gynecology | Admitting: Obstetrics and Gynecology

## 2020-03-30 ENCOUNTER — Encounter: Payer: Self-pay | Admitting: Obstetrics and Gynecology

## 2020-03-30 ENCOUNTER — Inpatient Hospital Stay: Payer: Managed Care, Other (non HMO) | Admitting: Anesthesiology

## 2020-03-30 DIAGNOSIS — O99893 Other specified diseases and conditions complicating puerperium: Secondary | ICD-10-CM | POA: Diagnosis not present

## 2020-03-30 DIAGNOSIS — R200 Anesthesia of skin: Secondary | ICD-10-CM | POA: Diagnosis not present

## 2020-03-30 DIAGNOSIS — D62 Acute posthemorrhagic anemia: Secondary | ICD-10-CM | POA: Diagnosis not present

## 2020-03-30 DIAGNOSIS — O99344 Other mental disorders complicating childbirth: Secondary | ICD-10-CM | POA: Diagnosis present

## 2020-03-30 DIAGNOSIS — O09293 Supervision of pregnancy with other poor reproductive or obstetric history, third trimester: Secondary | ICD-10-CM | POA: Diagnosis not present

## 2020-03-30 DIAGNOSIS — Z3A39 39 weeks gestation of pregnancy: Secondary | ICD-10-CM | POA: Diagnosis not present

## 2020-03-30 DIAGNOSIS — O99824 Streptococcus B carrier state complicating childbirth: Secondary | ICD-10-CM | POA: Diagnosis present

## 2020-03-30 DIAGNOSIS — F411 Generalized anxiety disorder: Secondary | ICD-10-CM | POA: Diagnosis present

## 2020-03-30 DIAGNOSIS — Z3A37 37 weeks gestation of pregnancy: Secondary | ICD-10-CM | POA: Diagnosis not present

## 2020-03-30 DIAGNOSIS — O9081 Anemia of the puerperium: Secondary | ICD-10-CM | POA: Diagnosis not present

## 2020-03-30 DIAGNOSIS — O26893 Other specified pregnancy related conditions, third trimester: Secondary | ICD-10-CM | POA: Diagnosis present

## 2020-03-30 DIAGNOSIS — O99214 Obesity complicating childbirth: Secondary | ICD-10-CM | POA: Diagnosis present

## 2020-03-30 DIAGNOSIS — Z20822 Contact with and (suspected) exposure to covid-19: Secondary | ICD-10-CM | POA: Diagnosis present

## 2020-03-30 DIAGNOSIS — Z6791 Unspecified blood type, Rh negative: Secondary | ICD-10-CM | POA: Diagnosis not present

## 2020-03-30 DIAGNOSIS — O34219 Maternal care for unspecified type scar from previous cesarean delivery: Secondary | ICD-10-CM | POA: Diagnosis present

## 2020-03-30 DIAGNOSIS — O34211 Maternal care for low transverse scar from previous cesarean delivery: Secondary | ICD-10-CM | POA: Diagnosis present

## 2020-03-30 SURGERY — Surgical Case
Anesthesia: Choice

## 2020-03-30 MED ORDER — SODIUM CHLORIDE 0.9 % IV SOLN
INTRAVENOUS | Status: DC | PRN
  Administered 2020-03-30: 50 ug/min via INTRAVENOUS
  Administered 2020-03-30: 25 ug/min via INTRAVENOUS
  Administered 2020-03-30: 100 ug/min via INTRAVENOUS

## 2020-03-30 MED ORDER — BUPIVACAINE 0.25 % ON-Q PUMP DUAL CATH 400 ML
400.0000 mL | INJECTION | Status: DC
  Filled 2020-03-30: qty 400

## 2020-03-30 MED ORDER — PHENYLEPHRINE HCL (PRESSORS) 10 MG/ML IV SOLN
INTRAVENOUS | Status: AC
  Filled 2020-03-30: qty 1

## 2020-03-30 MED ORDER — LACTATED RINGERS IV SOLN: INTRAVENOUS | Status: DC

## 2020-03-30 MED ORDER — ORAL CARE MOUTH RINSE: 15.0000 mL | Freq: Once | OROMUCOSAL | Status: AC

## 2020-03-30 MED ORDER — PHENYLEPHRINE HCL (PRESSORS) 10 MG/ML IV SOLN: INTRAVENOUS | Status: DC | PRN

## 2020-03-30 MED ORDER — DEXTROSE 5 % IV SOLN
3.0000 g | INTRAVENOUS | Status: AC
  Administered 2020-03-30: 3 g via INTRAVENOUS
  Filled 2020-03-30: qty 3000

## 2020-03-30 MED ORDER — FENTANYL CITRATE (PF) 100 MCG/2ML IJ SOLN
INTRAMUSCULAR | Status: DC | PRN
  Administered 2020-03-30: 15 ug via INTRATHECAL

## 2020-03-30 MED ORDER — ACETAMINOPHEN 500 MG PO TABS
1000.0000 mg | ORAL_TABLET | Freq: Four times a day (QID) | ORAL | Status: DC
  Administered 2020-03-30 – 2020-03-31 (×5): 1000 mg via ORAL
  Filled 2020-03-30 (×5): qty 2

## 2020-03-30 MED ORDER — BUPIVACAINE HCL 0.5 % IJ SOLN
20.0000 mL | INTRAMUSCULAR | Status: AC
  Filled 2020-03-30: qty 20

## 2020-03-30 MED ORDER — DIPHENHYDRAMINE HCL 25 MG PO CAPS: 25.0000 mg | ORAL_CAPSULE | Freq: Four times a day (QID) | ORAL | Status: DC | PRN

## 2020-03-30 MED ORDER — DIBUCAINE (PERIANAL) 1 % EX OINT: 1.0000 "application " | TOPICAL_OINTMENT | CUTANEOUS | Status: DC | PRN

## 2020-03-30 MED ORDER — ZOLPIDEM TARTRATE 5 MG PO TABS: 5.0000 mg | ORAL_TABLET | Freq: Every evening | ORAL | Status: DC | PRN

## 2020-03-30 MED ORDER — COCONUT OIL OIL: 1.0000 "application " | TOPICAL_OIL | Status: DC | PRN

## 2020-03-30 MED ORDER — GLYCOPYRROLATE 0.2 MG/ML IJ SOLN
INTRAMUSCULAR | Status: AC
  Filled 2020-03-30: qty 1

## 2020-03-30 MED ORDER — ENOXAPARIN SODIUM 40 MG/0.4ML ~~LOC~~ SOLN
40.0000 mg | SUBCUTANEOUS | Status: DC
  Filled 2020-03-30: qty 0.4

## 2020-03-30 MED ORDER — WITCH HAZEL-GLYCERIN EX PADS: 1.0000 "application " | MEDICATED_PAD | CUTANEOUS | Status: DC | PRN

## 2020-03-30 MED ORDER — ONDANSETRON HCL 4 MG/2ML IJ SOLN
INTRAMUSCULAR | Status: AC
  Filled 2020-03-30: qty 2

## 2020-03-30 MED ORDER — OXYCODONE HCL 5 MG PO TABS
5.0000 mg | ORAL_TABLET | ORAL | Status: DC | PRN
  Administered 2020-03-30: 5 mg via ORAL
  Filled 2020-03-30: qty 1

## 2020-03-30 MED ORDER — SODIUM CHLORIDE 0.9 % IV SOLN: INTRAVENOUS | Status: DC | PRN

## 2020-03-30 MED ORDER — MENTHOL 3 MG MT LOZG
1.0000 | LOZENGE | OROMUCOSAL | Status: DC | PRN
  Filled 2020-03-30: qty 9

## 2020-03-30 MED ORDER — BUPIVACAINE IN DEXTROSE 0.75-8.25 % IT SOLN
INTRATHECAL | Status: DC | PRN
  Administered 2020-03-30: 1.8 mL via INTRATHECAL

## 2020-03-30 MED ORDER — BUPIVACAINE HCL (PF) 0.25 % IJ SOLN
INTRAMUSCULAR | Status: AC
  Filled 2020-03-30: qty 30

## 2020-03-30 MED ORDER — LIDOCAINE HCL (PF) 1 % IJ SOLN
INTRAMUSCULAR | Status: AC
  Filled 2020-03-30: qty 30

## 2020-03-30 MED ORDER — GLYCOPYRROLATE 0.2 MG/ML IJ SOLN
INTRAMUSCULAR | Status: DC | PRN
  Administered 2020-03-30: .2 mg via INTRAVENOUS

## 2020-03-30 MED ORDER — MENTHOL 3 MG MT LOZG
1.0000 | LOZENGE | OROMUCOSAL | Status: DC | PRN
  Administered 2020-03-30: 3 mg via ORAL
  Filled 2020-03-30: qty 9

## 2020-03-30 MED ORDER — SIMETHICONE 80 MG PO CHEW
80.0000 mg | CHEWABLE_TABLET | Freq: Three times a day (TID) | ORAL | Status: DC
  Administered 2020-03-30 – 2020-04-01 (×5): 80 mg via ORAL
  Filled 2020-03-30 (×5): qty 1

## 2020-03-30 MED ORDER — SENNOSIDES-DOCUSATE SODIUM 8.6-50 MG PO TABS
2.0000 | ORAL_TABLET | ORAL | Status: DC
  Administered 2020-03-30 – 2020-03-31 (×2): 2 via ORAL
  Filled 2020-03-30 (×2): qty 2

## 2020-03-30 MED ORDER — SOD CITRATE-CITRIC ACID 500-334 MG/5ML PO SOLN
30.0000 mL | ORAL | Status: AC
  Administered 2020-03-30: 30 mL via ORAL
  Filled 2020-03-30 (×3): qty 15

## 2020-03-30 MED ORDER — OXYCODONE-ACETAMINOPHEN 5-325 MG PO TABS: 1.0000 | ORAL_TABLET | ORAL | Status: DC | PRN

## 2020-03-30 MED ORDER — IBUPROFEN 800 MG PO TABS: 800.0000 mg | ORAL_TABLET | Freq: Three times a day (TID) | ORAL | Status: DC

## 2020-03-30 MED ORDER — KETOROLAC TROMETHAMINE 30 MG/ML IJ SOLN
30.0000 mg | Freq: Four times a day (QID) | INTRAMUSCULAR | Status: DC
  Administered 2020-03-30 – 2020-03-31 (×2): 30 mg via INTRAVENOUS
  Filled 2020-03-30 (×2): qty 1

## 2020-03-30 MED ORDER — OXYTOCIN-SODIUM CHLORIDE 30-0.9 UT/500ML-% IV SOLN
2.5000 [IU]/h | INTRAVENOUS | Status: AC
  Administered 2020-03-30: 2.5 [IU]/h via INTRAVENOUS
  Filled 2020-03-30: qty 500

## 2020-03-30 MED ORDER — OXYCODONE HCL 5 MG PO TABS
10.0000 mg | ORAL_TABLET | ORAL | Status: AC | PRN
  Administered 2020-03-31 (×2): 10 mg via ORAL
  Filled 2020-03-30 (×2): qty 2

## 2020-03-30 MED ORDER — FAMOTIDINE 20 MG PO TABS: 20.0000 mg | ORAL_TABLET | Freq: Once | ORAL | Status: DC

## 2020-03-30 MED ORDER — LACTATED RINGERS IV SOLN: Freq: Once | INTRAVENOUS | Status: AC

## 2020-03-30 MED ORDER — EPHEDRINE SULFATE 50 MG/ML IJ SOLN
INTRAMUSCULAR | Status: DC | PRN
  Administered 2020-03-30 (×2): 10 mg via INTRAVENOUS

## 2020-03-30 MED ORDER — SIMETHICONE 80 MG PO CHEW
80.0000 mg | CHEWABLE_TABLET | ORAL | Status: DC
  Administered 2020-03-30 – 2020-03-31 (×2): 80 mg via ORAL
  Filled 2020-03-30 (×2): qty 1

## 2020-03-30 MED ORDER — ONDANSETRON HCL 4 MG/2ML IJ SOLN
INTRAMUSCULAR | Status: DC | PRN
  Administered 2020-03-30: 4 mg via INTRAVENOUS

## 2020-03-30 MED ORDER — BUPROPION HCL ER (XL) 300 MG PO TB24
300.0000 mg | ORAL_TABLET | Freq: Every day | ORAL | Status: DC
  Filled 2020-03-30 (×3): qty 1

## 2020-03-30 MED ORDER — ENOXAPARIN SODIUM 80 MG/0.8ML ~~LOC~~ SOLN
0.5000 mg/kg | SUBCUTANEOUS | Status: DC
  Administered 2020-03-31 – 2020-04-01 (×2): 75 mg via SUBCUTANEOUS
  Filled 2020-03-30 (×2): qty 0.8

## 2020-03-30 MED ORDER — SIMETHICONE 80 MG PO CHEW: 80.0000 mg | CHEWABLE_TABLET | ORAL | Status: DC | PRN

## 2020-03-30 MED ORDER — LIDOCAINE 5 % EX PTCH
1.0000 | MEDICATED_PATCH | CUTANEOUS | Status: DC
  Administered 2020-03-31: 1 via TRANSDERMAL
  Filled 2020-03-30 (×2): qty 1

## 2020-03-30 MED ORDER — OXYCODONE HCL 5 MG PO TABS
5.0000 mg | ORAL_TABLET | ORAL | Status: AC | PRN
  Administered 2020-03-30: 5 mg via ORAL
  Filled 2020-03-30: qty 1

## 2020-03-30 MED ORDER — OXYTOCIN 10 UNIT/ML IJ SOLN
INTRAMUSCULAR | Status: AC
  Filled 2020-03-30: qty 2

## 2020-03-30 MED ORDER — IBUPROFEN 800 MG PO TABS
ORAL_TABLET | ORAL | Status: AC
  Administered 2020-03-30: 800 mg via ORAL
  Filled 2020-03-30: qty 1

## 2020-03-30 MED ORDER — CHLORHEXIDINE GLUCONATE 0.12 % MT SOLN
15.0000 mL | Freq: Once | OROMUCOSAL | Status: AC
  Administered 2020-03-30: 15 mL via OROMUCOSAL
  Filled 2020-03-30: qty 15

## 2020-03-30 MED ORDER — FENTANYL CITRATE (PF) 100 MCG/2ML IJ SOLN
INTRAMUSCULAR | Status: AC
  Filled 2020-03-30: qty 2

## 2020-03-30 MED ORDER — PRENATAL MULTIVITAMIN CH
1.0000 | ORAL_TABLET | Freq: Every day | ORAL | Status: DC
  Administered 2020-03-31 – 2020-04-01 (×2): 1 via ORAL
  Filled 2020-03-30 (×2): qty 1

## 2020-03-30 MED ORDER — AMMONIA AROMATIC IN INHA
RESPIRATORY_TRACT | Status: AC
  Filled 2020-03-30: qty 10

## 2020-03-30 MED ORDER — OXYTOCIN-SODIUM CHLORIDE 30-0.9 UT/500ML-% IV SOLN
INTRAVENOUS | Status: DC | PRN
  Administered 2020-03-30: 250 mL/h via INTRAVENOUS
  Administered 2020-03-30: 400 mL via INTRAVENOUS

## 2020-03-30 MED ORDER — BUPIVACAINE HCL (PF) 0.5 % IJ SOLN
INTRAMUSCULAR | Status: AC
  Filled 2020-03-30: qty 30

## 2020-03-30 MED ORDER — MISOPROSTOL 200 MCG PO TABS
ORAL_TABLET | ORAL | Status: AC
  Filled 2020-03-30: qty 4

## 2020-03-30 MED ORDER — FENTANYL CITRATE (PF) 100 MCG/2ML IJ SOLN: 25.0000 ug | INTRAMUSCULAR | Status: DC | PRN

## 2020-03-30 SURGICAL SUPPLY — 42 items
BAG COUNTER SPONGE EZ (MISCELLANEOUS) ×2 IMPLANT
CANISTER SUCT 3000ML PPV (MISCELLANEOUS) ×3 IMPLANT
CATH KIT ON-Q SILVERSOAK 5IN (CATHETERS) ×6 IMPLANT
CHLORAPREP W/TINT 26 (MISCELLANEOUS) ×6 IMPLANT
CLOSURE WOUND 1/2 X4 (GAUZE/BANDAGES/DRESSINGS) ×1
COUNTER SPONGE BAG EZ (MISCELLANEOUS) ×1
DERMABOND ADVANCED (GAUZE/BANDAGES/DRESSINGS) ×2
DERMABOND ADVANCED .7 DNX12 (GAUZE/BANDAGES/DRESSINGS) ×1 IMPLANT
DRESSING PREVENA PLUS CUSTOM (GAUZE/BANDAGES/DRESSINGS) ×1 IMPLANT
DRESSING SURGICEL FIBRLLR 1X2 (HEMOSTASIS) ×1 IMPLANT
DRSG OPSITE POSTOP 4X10 (GAUZE/BANDAGES/DRESSINGS) ×3 IMPLANT
DRSG PREVENA PLUS CUSTOM (GAUZE/BANDAGES/DRESSINGS) ×3
DRSG SURGICEL FIBRILLAR 1X2 (HEMOSTASIS) ×3
DRSG TELFA 3X8 NADH (GAUZE/BANDAGES/DRESSINGS) ×3 IMPLANT
ELECT CAUTERY BLADE 6.4 (BLADE) ×3 IMPLANT
ELECT REM PT RETURN 9FT ADLT (ELECTROSURGICAL) ×3
ELECTRODE REM PT RTRN 9FT ADLT (ELECTROSURGICAL) ×1 IMPLANT
GAUZE SPONGE 4X4 12PLY STRL (GAUZE/BANDAGES/DRESSINGS) ×3 IMPLANT
GLOVE BIO SURGEON STRL SZ7 (GLOVE) ×3 IMPLANT
GLOVE INDICATOR 7.5 STRL GRN (GLOVE) ×3 IMPLANT
GLOVE SKINSENSE NS SZ7.0 (GLOVE) ×2
GLOVE SKINSENSE STRL SZ7.0 (GLOVE) ×1 IMPLANT
GLOVE SURG SYN 7.5  E (GLOVE) ×2
GLOVE SURG SYN 7.5 E (GLOVE) ×1 IMPLANT
GOWN STRL REUS W/ TWL LRG LVL3 (GOWN DISPOSABLE) ×3 IMPLANT
GOWN STRL REUS W/TWL LRG LVL3 (GOWN DISPOSABLE) ×6
KIT PREVENA INCISION MGT20CM45 (CANNISTER) ×3 IMPLANT
MANIFOLD NEPTUNE II (INSTRUMENTS) ×3 IMPLANT
NS IRRIG 1000ML POUR BTL (IV SOLUTION) ×3 IMPLANT
PACK C SECTION AR (MISCELLANEOUS) ×3 IMPLANT
PAD OB MATERNITY 4.3X12.25 (PERSONAL CARE ITEMS) ×3 IMPLANT
PAD PREP 24X41 OB/GYN DISP (PERSONAL CARE ITEMS) ×3 IMPLANT
PENCIL SMOKE ULTRAEVAC 22 CON (MISCELLANEOUS) ×3 IMPLANT
SPONGE LAP 18X18 RF (DISPOSABLE) ×6 IMPLANT
STRIP CLOSURE SKIN 1/2X4 (GAUZE/BANDAGES/DRESSINGS) ×2 IMPLANT
SUT CHROMIC 0 CT 1 (SUTURE) ×3 IMPLANT
SUT MNCRL AB 4-0 PS2 18 (SUTURE) ×3 IMPLANT
SUT PDS AB 1 TP1 96 (SUTURE) ×6 IMPLANT
SUT PLAIN 2 0 XLH (SUTURE) ×3 IMPLANT
SUT VIC AB 0 CTX 36 (SUTURE) ×10
SUT VIC AB 0 CTX36XBRD ANBCTRL (SUTURE) ×5 IMPLANT
SUT VIC AB 2-0 CT1 36 (SUTURE) ×3 IMPLANT

## 2020-03-30 NOTE — Transfer of Care (Signed)
Immediate Anesthesia Transfer of Care Note  Patient: Debra Mcdaniel  Procedure(s) Performed: CESAREAN SECTION (N/A )  Patient Location: PACU and Mother/Baby  Anesthesia Type:Spinal  Level of Consciousness: awake  Airway & Oxygen Therapy: Patient Spontanous Breathing  Post-op Assessment: Report given to RN  Post vital signs: stable  Last Vitals:  Vitals Value Taken Time  BP    Temp    Pulse    Resp    SpO2      Last Pain:  Vitals:   03/30/20 0705  TempSrc:   PainSc: 0-No pain         Complications: No complications documented.

## 2020-03-30 NOTE — Discharge Summary (Signed)
OB Discharge Summary     Patient Name: Debra Mcdaniel DOB: 1990-09-16 MRN: [id number]  Date of admission: 03/30/2020 Delivering MD: [name]   Date of discharge: 04/01/2020  Admitting diagnosis: Uterine scar from previous cesarean delivery affecting pregnancy [O34.219] Intrauterine pregnancy: [redacted]w[redacted]d     Secondary diagnosis:  Active Problems:   Uterine scar from previous cesarean delivery affecting pregnancy  Additional problems: obesity in pregnancy, short pregnancy itnerval      Discharge diagnosis: Term Pregnancy Delivered                                                                                                Post partum procedures:rhogam  Augmentation: N/A  Complications: None  Hospital course:  Sceduled C/S   29 y.o. yo G3P3003 at [redacted]w[redacted]d was admitted to the hospital 03/30/2020 for scheduled cesarean section with the following indication:Elective Repeat.Delivery details are as follows:  Membrane Rupture Time/Date: 9:16 AM ,03/30/2020   Delivery Method:C-Section, Low Transverse  with involvement of the contractile portion of the uterus.  Details of operation can be found in separate operative note.  Patient had an uncomplicated postpartum course.  She is ambulating, tolerating a regular diet, passing flatus, and urinating well. Patient is discharged home in stable condition on  04/01/20        Newborn Data: Birth date:03/30/2020  Birth time:9:17 AM  Gender:Female  Living status:Living  Apgars:8 ,9  Weight:3680 g     Physical exam  Vitals:   03/31/20 1634 03/31/20 2306 03/31/20 2350 04/01/20 0749  BP: 119/69 138/76  131/73  Pulse: (!) 102 96  96  Resp: 18 20  20   Temp: 98.7 F (37.1 C) 98 F (36.7 C) 98.9 F (37.2 C) 97.7 F (36.5 C)  TempSrc: Oral Oral Oral Oral  SpO2: 99% 99%  100%  Weight:      Height:       General: alert, cooperative and no distress Lochia: appropriate Uterine Fundus: firm Incision: Healing well with no significant  drainage DVT Evaluation: No evidence of DVT seen on physical exam. Labs: Lab Results  Component Value Date   WBC 8.5 03/31/2020   HGB 9.4 (L) 03/31/2020   HCT 29.6 (L) 03/31/2020   MCV 93.1 03/31/2020   PLT 251 03/31/2020   CMP Latest Ref Rng & Units 03/29/2020  Glucose 70 - 99 mg/dL 03/31/2020)  BUN 6 - 20 mg/dL 10  Creatinine 502(D - 7.41 mg/dL 2.87  Sodium 8.67 - 672 mmol/L 137  Potassium 3.5 - 5.1 mmol/L 3.6  Chloride 98 - 111 mmol/L 105  CO2 22 - 32 mmol/L 22  Calcium 8.9 - 10.3 mg/dL 094)  Total Protein 6.0 - 8.5 g/dL -  Total Bilirubin 0.0 - 1.2 mg/dL -  Alkaline Phos 44 - 7.0(J IU/L -  AST 0 - 40 IU/L -  ALT 0 - 32 IU/L -    Discharge instruction: per After Visit Summary and "Baby and Me Booklet".  After visit meds:  Allergies as of 04/01/2020      Reactions   Nickel Rash      Medication List  TAKE these medications   acetaminophen 500 MG tablet Commonly known as: TYLENOL Take 1,000 mg by mouth every 6 (six) hours as needed for moderate pain or headache.   buPROPion 300 MG 24 hr tablet Commonly known as: Wellbutrin XL Take 1 tablet (300 mg total) by mouth daily.   docusate sodium 100 MG capsule Commonly known as: Colace Take 1 capsule (100 mg total) by mouth 2 (two) times daily as needed.   enoxaparin 80 MG/0.8ML injection Commonly known as: LOVENOX Inject 0.8 mLs (80 mg total) into the skin daily for 10 days. Start taking on: April 02, 2020   PRENATAL PO Take 1 tablet by mouth daily.            Discharge Care Instructions  (From admission, onward)         Start     Ordered   04/01/20 0000  Discharge wound care:       Comments: SHOWER DAILY Wash incision gently with soap and water.  Call office with any drainage, redness, or firmness of the incision.   04/01/20 1149          Diet: routine diet  Activity: Advance as tolerated. Pelvic rest for 6 weeks.   Outpatient follow up:1 week Follow up Appt: Future Appointments  Date Time  Provider Department Center  04/08/2020  2:00 PM [name], MD [facility] None   Follow up Visit:No follow-ups on file.  Postpartum contraception: IUD Mirena  Newborn Data: Live born female  Birth Weight: 8 lb 1.8 oz (3680 g) APGAR: 8, 9  Newborn Delivery   Birth date/time: 03/30/2020 09:17:00 Delivery type: C-Section, Low Transverse Trial of labor: No C-section categorization: Repeat      Baby Feeding: Bottle Disposition:home with mother   04/01/2020 Natale Milch, MD

## 2020-03-30 NOTE — Interval H&P Note (Signed)
History and Physical Interval Note:  03/30/2020 7:49 AM  Debra Mcdaniel  has presented today for surgery, with the diagnosis of repeat c/section.  The various methods of treatment have been discussed with the patient and family. After consideration of risks, benefits and other options for treatment, the patient has consented to  Procedure(s): CESAREAN SECTION (N/A) as a surgical intervention.  The patient's history has been reviewed, patient examined, no change in status, stable for surgery.  I have reviewed the patient's chart and labs.  Questions were answered to the patient's satisfaction.     [name]

## 2020-03-30 NOTE — Progress Notes (Signed)
Pt arrived to [facility] for scheduled caesarean section.

## 2020-03-30 NOTE — Anesthesia Preprocedure Evaluation (Signed)
Anesthesia Evaluation  Patient identified by MRN, date of birth, ID band Patient awake    Reviewed: Allergy & Precautions, H&P , NPO status , Patient's Chart, lab work & pertinent test results, reviewed documented beta blocker date and time   History of Anesthesia Complications (+) PONV, Family history of anesthesia reaction and history of anesthetic complications  Airway Mallampati: III   Neck ROM: full    Dental  (+) Teeth Intact   Pulmonary neg pulmonary ROS,    Pulmonary exam normal        Cardiovascular Exercise Tolerance: Poor negative cardio ROS Normal cardiovascular exam Rhythm:regular Rate:Normal     Neuro/Psych  Headaches, PSYCHIATRIC DISORDERS Anxiety Depression    GI/Hepatic negative GI ROS, Neg liver ROS,   Endo/Other  Morbid obesity  Renal/GU negative Renal ROS  negative genitourinary   Musculoskeletal   Abdominal   Peds  Hematology negative hematology ROS (+)   Anesthesia Other Findings Past Medical History: No date: Anxiety No date: BMI 50.0-59.9, adult (HCC) No date: Complication of anesthesia     Comment:  has NV right before both csections No date: Depression No date: Family history of adverse reaction to anesthesia     Comment:  mother but not sure what happened. No date: Headache No date: IBS (irritable bowel syndrome) No date: PONV (postoperative nausea and vomiting)     Comment:  had after 1 of her c sections Past Surgical History: No date: CESAREAN SECTION 03/15/2019: CESAREAN SECTION; N/A     Comment:  Procedure: CESAREAN SECTION;  Surgeon: [name],              MD;  Location: [facility] OR[facility];  Service: Obstetrics;                Laterality: N/A; 07/24/2017: COLONOSCOPY WITH PROPOFOL; N/A     Comment:  Procedure: COLONOSCOPY WITH PROPOFOL;  Surgeon: Toledo,               Boykin Nearing, MD;  Location: [facility];  Service:               Gastroenterology;  Laterality:  N/A; 07/24/2017: ESOPHAGOGASTRODUODENOSCOPY (EGD) WITH PROPOFOL; N/A     Comment:  Procedure: ESOPHAGOGASTRODUODENOSCOPY (EGD) WITH               PROPOFOL;  Surgeon: Toledo, Boykin Nearing, MD;  Location:               [facility];  Service: Gastroenterology;  Laterality:               N/A; 02/2013: MIrena  BMI    Body Mass Index: 60.36 kg/m     Reproductive/Obstetrics negative OB ROS                             Anesthesia Physical Anesthesia Plan  ASA: III  Anesthesia Plan: Spinal   Post-op Pain Management:    Induction:   PONV Risk Score and Plan: 4 or greater  Airway Management Planned:   Additional Equipment:   Intra-op Plan:   Post-operative Plan:   Informed Consent: I have reviewed the patients History and Physical, chart, labs and discussed the procedure including the risks, benefits and alternatives for the proposed anesthesia with the patient or authorized representative who has indicated his/her understanding and acceptance.     Dental Advisory Given  Plan Discussed with: CRNA  Anesthesia Plan Comments:  Anesthesia Quick Evaluation

## 2020-03-30 NOTE — Op Note (Addendum)
Preoperative Diagnosis: 1) 29 y.o. [id number] at [redacted]w[redacted]d history of 2 prior cesarean sections 2) Short interval pregnancy 3) Morbid obesity Body mass index is 60.36 kg/m.   Postoperative Diagnosis: G3P3003 at [redacted]w[redacted]d history of 2 prior cesarean sections 2) Short interval pregnancy 3) Morbid obesity Body mass index is 60.36 kg/m.  Operation Performed: Repeat low transverse C-section with entry into the contractile portion (treat as classical with subsequent pregnancies)  via pfannenstiel skin incision   Indiciation: History of prior cesarean desiring repeat  Anesthesia: Spinal  Primary Surgeon: [name], MD   Assistant:Stephen Jean Rosenthal, MD this surgery required a high level surgical assistant with none other readily available   Preoperative Antibiotics: 3g ancef  Estimated Blood Loss: 2081 mL  IV Fluids:  Urine Output:: pending  Drains or Tubes: Foley to gravity drainage, ON-Q catheter system  Implants: none  Specimens Removed: none  Complications: none  Intraoperative Findings:  Normal tubes ovaries and uterus.  Dense scarring of the rectus fascia and omental adhesive disease.  The incision involved the contractile portion of the uterine muscle.  Delivery resulted in the birth of a liveborn female, APGAR (1 MIN): 8   APGAR (5 MINS): 9    Patient Condition:stable  Procedure in Detail:  Patient was taken to the operating room were she was administered regional anesthesia.  She was positioned in the supine position, prepped and draped in the  Usual sterile fashion.  Prior to proceeding with the case a time out was performed and the level of anesthetic was checked and noted to be adequate.  Utilizing the scalpel a pfannenstiel skin incision was made 2cm above the pubic symphysis utilizing the patient's pre-existing scar and carried down sharply to the the level of the rectus fascia.  The fascia was incised in the midline using the scalpel and then extended using mayo  scissors.  The superior border of the rectus fascia was grasped with two Kocher clamps and the underlying rectus muscles were dissected of the fascia using blunt dissection.  The median raphae was incised using Mayo scissors.   The inferior border of the rectus fascia was dissected of the rectus muscles in a similar fashion.  The midline was identified, the peritoneum was entered bluntly and expanded using manual tractions.  The uterus was noted to be in a none rotated position.  Dense adhesions of omentum were encountered which were clamped with Kelly clamps, transected using the bovie, then tied of with 2-0 Vicryl free ties.  Next the bladder blade was placed retracting the bladder caudally.  A bladder flap was not created.  A low transverse incision was scored on the lower uterine segment.  The hysterotomy was entered bluntly using the operators finger.  The hysterotomy incision was extended using manual traction.  The operators hand was placed within the hysterotomy position noting the fetus to be within the OA position.  The vertex was grasped, flexed, brought to the incision, and delivered a traumatically using fundal pressure applied to the incision applied by Dr. Jean Rosenthal.  The remainder of the body delivered with ease.  The infant was suctioned, cord was clamped and cut before handing off to the awaiting neonatologist.  The placenta was delivered using manual extraction.  The uterus was exteriorized, wiped clean of clots and debris using two moist laps.  The hysterotomy was closed using a two layer closure of 0 Vicryl, with the first being a running locked, the second a vertical imbricating.  The uterus was returned to the  abdomen.  The peritoneal gutters were wiped clean of clots and debris using two moist laps.  The hysterotomy incision was re-inspected noted to be hemostatic. The rectus muscles were inspected noted to be hemostatic.  The fascia was closed using a looped #1 PDS in a running fashion  taking 1cm by 1cm bites.  The subcutaneous tissue was irrigated using warm saline, hemostasis achieved using the bovie.  The subcutaneous dead space was greater than 3cm and was closed.  The subcutaneous dead space was obliterated by using a 53-T 0 Chromic in a running fashion.    The skin was closed using staples.  Sponge needle and instrument counts were corrects times two.  The patient tolerated the procedure well and was taken to the recovery room in stable condition.

## 2020-03-30 NOTE — Progress Notes (Signed)
PHARMACIST - PHYSICIAN COMMUNICATION  CONCERNING:  Enoxaparin (Lovenox) for DVT Prophylaxis    RECOMMENDATION: Patient was prescribed enoxaprin 40mg  q24 hours for VTE prophylaxis.   Filed Weights   03/30/20 0621  Weight: (!) 149.7 kg (330 lb)    Body mass index is 60.36 kg/m.  Estimated Creatinine Clearance: 148.6 mL/min (by C-G formula based on SCr of 0.74 mg/dL).   Based on [facility] policy patient is candidate for enoxaparin 0.5mg /kg TBW SQ every 24 hours based on BMI being >30.   DESCRIPTION: Pharmacy has adjusted enoxaparin dose per [facility] policy.  Patient is now receiving enoxaparin 75 mg every q24 hours    Debra Mcdaniel, PharmD Clinical Pharmacist  03/30/2020 11:30 AM

## 2020-03-30 NOTE — Anesthesia Procedure Notes (Signed)
Spinal  Patient location during procedure: OR Staffing Performed: anesthesiologist  Preanesthetic Checklist Completed: patient identified, IV checked, site marked, risks and benefits discussed, surgical consent, monitors and equipment checked, pre-op evaluation and timeout performed Spinal Block Patient position: sitting Prep: DuraPrep Patient monitoring: heart rate, cardiac monitor, continuous pulse ox and blood pressure Approach: midline Location: L3-4 Injection technique: single-shot Needle Needle type: Sprotte  Needle gauge: 24 G Needle length: 9 cm Assessment Sensory level: T4 Additional Notes Very difficult spinal secondary to body habitus.  Transient paresthesia right side but ultimately able to obtain clear csf after 3 rd site central pass of 24 sprotte.  Vsst. Fhr stable 150

## 2020-03-30 NOTE — Progress Notes (Signed)
Brief Anesthesia Note  Called by bedside RN due to pt complaints of sharp pain at spinal site as well as lower leg and foot numbness.    On bedside evaluation, pt reports that her legs felt "mostly normal" this evening but then when she went to briefly get out of bed she noticed numbness on her bilateral lower legs and the dorsal and plantar aspects of both feet.  She also feels some "pins and needles" sensations that are coming and going. She also noticed some lower leg swelling around the same time.  She also felt some sharp pain at her spinal site that lasted for a brief period of time and has since resolved.  Also c/o incisional pain in her abdomen.  She still has her foley catheter in place.  She has not had a BM.  On exam, she reports mild tenderness on deep palpation at spinal site. LE strength is normal. 2+ pitting edema in BLE.  Pt has been in bed all day.  My suspicion for epidural hematoma is low at this time.  More likely paresthesias from her positioning and LE edema.  I discussed red flag symptoms of epidural hematoma with patient and RN.  I advised that she needs to get out of bed and walk as much as possible.  I added lidocaine patch for incision site to her pain regimen.  She will get frequent neuro checks overnight, and both pt and RN know to notify me immediately in the case of any new or worsening symptoms.    Karleen Hampshire, MD Anesthesiology

## 2020-03-31 LAB — CBC
HCT: 29.6 % — ABNORMAL LOW (ref 36.0–46.0)
Hemoglobin: 9.4 g/dL — ABNORMAL LOW (ref 12.0–15.0)
MCH: 29.6 pg (ref 26.0–34.0)
MCHC: 31.8 g/dL (ref 30.0–36.0)
MCV: 93.1 fL (ref 80.0–100.0)
Platelets: 251 10*3/uL (ref 150–400)
RBC: 3.18 MIL/uL — ABNORMAL LOW (ref 3.87–5.11)
RDW: 14.6 % (ref 11.5–15.5)
WBC: 8.5 10*3/uL (ref 4.0–10.5)
nRBC: 0 % (ref 0.0–0.2)

## 2020-03-31 LAB — FETAL SCREEN: Fetal Screen: NEGATIVE

## 2020-03-31 MED ORDER — OXYCODONE HCL 5 MG PO TABS
5.0000 mg | ORAL_TABLET | ORAL | Status: DC | PRN
  Administered 2020-03-31: 10 mg via ORAL
  Filled 2020-03-31 (×2): qty 2

## 2020-03-31 MED ORDER — RHO D IMMUNE GLOBULIN 1500 UNIT/2ML IJ SOSY
300.0000 ug | PREFILLED_SYRINGE | Freq: Once | INTRAMUSCULAR | Status: AC
  Administered 2020-03-31: 300 ug via INTRAMUSCULAR
  Filled 2020-03-31: qty 2

## 2020-03-31 MED ORDER — IBUPROFEN 800 MG PO TABS
800.0000 mg | ORAL_TABLET | Freq: Three times a day (TID) | ORAL | Status: DC
  Administered 2020-03-31 – 2020-04-01 (×2): 800 mg via ORAL
  Filled 2020-03-31 (×2): qty 1

## 2020-03-31 MED ORDER — ACETAMINOPHEN 500 MG PO TABS
1000.0000 mg | ORAL_TABLET | Freq: Four times a day (QID) | ORAL | Status: DC
  Administered 2020-03-31 – 2020-04-01 (×3): 1000 mg via ORAL
  Filled 2020-03-31 (×3): qty 2

## 2020-03-31 MED ORDER — IBUPROFEN 800 MG PO TABS
800.0000 mg | ORAL_TABLET | Freq: Three times a day (TID) | ORAL | Status: AC
  Administered 2020-03-31: 800 mg via ORAL
  Filled 2020-03-31: qty 1

## 2020-03-31 NOTE — Progress Notes (Signed)
Subjective: Postpartum Day 1: Cesarean Delivery Patient reports nausea, vomiting, tolerating PO, + flatus and no problems voiding.    Objective: Vital signs in last 24 hours: Temp:  [98 F (36.7 C)-99.5 F (37.5 C)] 98.4 F (36.9 C) (11/25 0848) Pulse Rate:  [77-119] 110 (11/25 0848) Resp:  [15-30] 20 (11/25 0848) BP: (121-134)/(66-91) 126/69 (11/25 0848) SpO2:  [94 %-100 %] 99 % (11/25 0848)  Physical Exam:  General: alert, cooperative and morbidly obese Lochia: appropriate Uterine Fundus: firm Incision: healing well, no significant drainage. Wound vac is tightly sealed. DVT Evaluation: No evidence of DVT seen on physical exam. Negative Homan's sign. No cords or calf tenderness.  Recent Labs    03/29/20 0832 03/31/20 0642  HGB 11.6* 9.4*  HCT 36.5 29.6*    Assessment/Plan: Status post Cesarean section. Doing well postoperatively.  Continue current care.  Mirna Mires 03/31/2020, 11:46 AM

## 2020-03-31 NOTE — Anesthesia Post-op Follow-up Note (Signed)
  Anesthesia Pain Follow-up Note  Patient: Debra Mcdaniel  Day #: 1  Date of Follow-up: 03/31/2020 Time: 7:36 AM  Last Vitals:  Vitals:   03/31/20 0500 03/31/20 0600  BP:    Pulse: (!) 104 100  Resp:    Temp:    SpO2: 94% 94%    Level of Consciousness: alert  Pain: mild   Side Effects:None  Catheter Site Exam:clean  Anti-Coag Meds (From admission, onward)   Start     Dose/Rate Route Frequency Ordered Stop   03/31/20 0800  enoxaparin (LOVENOX) injection 75 mg        0.5 mg/kg  149.7 kg Subcutaneous Every 24 hours 03/30/20 1130         Plan: D/C from anesthesia care at surgeon's request  Eriberto Felch,  Alessandra Bevels

## 2020-03-31 NOTE — Anesthesia Postprocedure Evaluation (Signed)
Anesthesia Post Note  Patient: Debra Mcdaniel  Procedure(s) Performed: CESAREAN SECTION (N/A )  Patient location during evaluation: Mother Baby Anesthesia Type: Spinal Level of consciousness: oriented and awake and alert Pain management: pain level controlled Vital Signs Assessment: post-procedure vital signs reviewed and stable Respiratory status: spontaneous breathing and respiratory function stable Cardiovascular status: blood pressure returned to baseline and stable Postop Assessment: no headache, no backache, no apparent nausea or vomiting and able to ambulate Anesthetic complications: no Comments: Patient states she has started to feel better since getting out of bed and moving. No problems with ambulation or voiding.   No complications documented.   Last Vitals:  Vitals:   03/31/20 0500 03/31/20 0600  BP:    Pulse: (!) 104 100  Resp:    Temp:    SpO2: 94% 94%    Last Pain:  Vitals:   03/31/20 0330  TempSrc: Oral  PainSc:                  Rica Mast

## 2020-04-01 LAB — RHOGAM INJECTION: Unit division: 0

## 2020-04-01 MED ORDER — GUAIFENESIN ER 600 MG PO TB12
600.0000 mg | ORAL_TABLET | Freq: Two times a day (BID) | ORAL | Status: DC | PRN
  Administered 2020-04-01: 600 mg via ORAL
  Filled 2020-04-01 (×3): qty 1

## 2020-04-01 MED ORDER — DOCUSATE SODIUM 100 MG PO CAPS: 100.0000 mg | ORAL_CAPSULE | Freq: Two times a day (BID) | ORAL | 0 refills | Status: DC | PRN

## 2020-04-01 MED ORDER — ENOXAPARIN SODIUM 80 MG/0.8ML ~~LOC~~ SOLN: 80.0000 mg | SUBCUTANEOUS | 0 refills | Status: DC

## 2020-04-01 MED ORDER — IBUPROFEN 800 MG PO TABS
800.0000 mg | ORAL_TABLET | Freq: Three times a day (TID) | ORAL | Status: DC
  Administered 2020-04-01: 800 mg via ORAL
  Filled 2020-04-01: qty 1

## 2020-04-01 NOTE — Discharge Instructions (Signed)
Postpartum Care After Cesarean Delivery This sheet gives you information about how to care for yourself from the time you deliver your baby to up to 6-12 weeks after delivery (postpartum period). Your health care provider may also give you more specific instructions. If you have problems or questions, contact your health care provider. Follow these instructions at home: Medicines  Take over-the-counter and prescription medicines only as told by your health care provider.  If you were prescribed an antibiotic medicine, take it as told by your health care provider. Do not stop taking the antibiotic even if you start to feel better.  Ask your health care provider if the medicine prescribed to you: ? Requires you to avoid driving or using heavy machinery. ? Can cause constipation. You may need to take actions to prevent or treat constipation, such as:  Drink enough fluid to keep your urine pale yellow.  Take over-the-counter or prescription medicines.  Eat foods that are high in fiber, such as beans, whole grains, and fresh fruits and vegetables.  Limit foods that are high in fat and processed sugars, such as fried or sweet foods. Activity  Gradually return to your normal activities as told by your health care provider.  Avoid activities that take a lot of effort and energy (are strenuous) until approved by your health care provider. Walking at a slow to moderate pace is usually safe. Ask your health care provider what activities are safe for you. ? Do not lift anything that is heavier than your baby or 10 lb (4.5 kg) as told by your health care provider. ? Do not vacuum, climb stairs, or drive a car for as long as told by your health care provider.  If possible, have someone help you at home until you are able to do your usual activities yourself.  Rest as much as possible. Try to rest or take naps while your baby is sleeping. Vaginal bleeding  It is normal to have vaginal bleeding  (lochia) after delivery. Wear a sanitary pad to absorb vaginal bleeding and discharge. ? During the first week after delivery, the amount and appearance of lochia is often similar to a menstrual period. ? Over the next few weeks, it will gradually decrease to a dry, yellow-brown discharge. ? For most women, lochia stops completely by 4-6 weeks after delivery. Vaginal bleeding can vary from woman to woman.  Change your sanitary pads frequently. Watch for any changes in your flow, such as: ? A sudden increase in volume. ? A change in color. ? Large blood clots.  If you pass a blood clot, save it and call your health care provider to discuss. Do not flush blood clots down the toilet before you get instructions from your health care provider.  Do not use tampons or douches until your health care provider says this is safe.  If you are not breastfeeding, your period should return 6-8 weeks after delivery. If you are breastfeeding, your period may return anytime between 8 weeks after delivery and the time that you stop breastfeeding. Perineal care   If your C-section (Cesarean section) was unplanned, and you were allowed to labor and push before delivery, you may have pain, swelling, and discomfort of the tissue between your vaginal opening and your anus (perineum). You may also have an incision in the tissue (episiotomy) or the tissue may have torn during delivery. Follow these instructions as told by your health care provider: ? Keep your perineum clean and dry as told by   your health care provider. Use medicated pads and pain-relieving sprays and creams as directed. ? If you have an episiotomy or vaginal tear, check the area every day for signs of infection. Check for:  Redness, swelling, or pain.  Fluid or blood.  Warmth.  Pus or a bad smell. ? You may be given a squirt bottle to use instead of wiping to clean the perineum area after you go to the bathroom. As you start healing, you may use  the squirt bottle before wiping yourself. Make sure to wipe gently. ? To relieve pain caused by an episiotomy, vaginal tear, or hemorrhoids, try taking a warm sitz bath 2-3 times a day. A sitz bath is a warm water bath that is taken while you are sitting down. The water should only come up to your hips and should cover your buttocks. Breast care  Within the first few days after delivery, your breasts may feel heavy, full, and uncomfortable (breast engorgement). You may also have milk leaking from your breasts. Your health care provider can suggest ways to help relieve breast discomfort. Breast engorgement should go away within a few days.  If you are breastfeeding: ? Wear a bra that supports your breasts and fits you well. ? Keep your nipples clean and dry. Apply creams and ointments as told by your health care provider. ? You may need to use breast pads to absorb milk leakage. ? You may have uterine contractions every time you breastfeed for several weeks after delivery. Uterine contractions help your uterus return to its normal size. ? If you have any problems with breastfeeding, work with your health care provider or a lactation consultant.  If you are not breastfeeding: ? Avoid touching your breasts as this can make your breasts produce more milk. ? Wear a well-fitting bra and use cold packs to help with swelling. ? Do not squeeze out (express) milk. This causes you to make more milk. Intimacy and sexuality  Ask your health care provider when you can engage in sexual activity. This may depend on your: ? Risk of infection. ? Healing rate. ? Comfort and desire to engage in sexual activity.  You are able to get pregnant after delivery, even if you have not had your period. If desired, talk with your health care provider about methods of family planning or birth control (contraception). Lifestyle  Do not use any products that contain nicotine or tobacco, such as cigarettes, e-cigarettes,  and chewing tobacco. If you need help quitting, ask your health care provider.  Do not drink alcohol, especially if you are breastfeeding. Eating and drinking   Drink enough fluid to keep your urine pale yellow.  Eat high-fiber foods every day. These may help prevent or relieve constipation. High-fiber foods include: ? Whole grain cereals and breads. ? Brown rice. ? Beans. ? Fresh fruits and vegetables.  Take your prenatal vitamins until your postpartum checkup or until your health care provider tells you it is okay to stop. General instructions  Keep all follow-up visits for you and your baby as told by your health care provider. Most women visit their health care provider for a postpartum checkup within the first 3-6 weeks after delivery. Contact a health care provider if you:  Feel unable to cope with the changes that a new baby brings to your life, and these feelings do not go away.  Feel unusually sad or worried.  Have breasts that are painful, hard, or turn red.  Have a fever.    Have trouble holding urine or keeping urine from leaking.  Have little or no interest in activities you used to enjoy.  Have not breastfed at all and you have not had a menstrual period for 12 weeks after delivery.  Have stopped breastfeeding and you have not had a menstrual period for 12 weeks after you stopped breastfeeding.  Have questions about caring for yourself or your baby.  Pass a blood clot from your vagina. Get help right away if you:  Have chest pain.  Have difficulty breathing.  Have sudden, severe leg pain.  Have severe pain or cramping in your abdomen.  Bleed from your vagina so much that you fill more than one sanitary pad in one hour. Bleeding should not be heavier than your heaviest period.  Develop a severe headache.  Faint.  Have blurred vision or spots in your vision.  Have a bad-smelling vaginal discharge.  Have thoughts about hurting yourself or your  baby. If you ever feel like you may hurt yourself or others, or have thoughts about taking your own life, get help right away. You can go to your nearest emergency department or call:  Your local emergency services (911 in the [place]  A suicide crisis helpline, such as the National Suicide Prevention Lifeline at 213-253-2861. This is open 24 hours a day. Summary  The period of time from when you deliver your baby to up to 6-12 weeks after delivery is called the postpartum period.  Gradually return to your normal activities as told by your health care provider.  Keep all follow-up visits for you and your baby as told by your health care provider. This information is not intended to replace advice given to you by your health care provider. Make sure you discuss any questions you have with your health care provider. Document Revised: 12/11/2017 Document Reviewed: 12/11/2017 Elsevier Patient Education  2020 [employer]. Postpartum Baby Blues The postpartum period begins right after the birth of a baby. During this time, there is often a lot of joy and excitement. It is also a time of many changes in the life of the parents. No matter how many times a mother gives birth, each child brings new challenges to the family, including different ways of relating to one another. It is common to have feelings of excitement along with confusing changes in moods, emotions, and thoughts. You may feel happy one minute and sad or stressed the next. These feelings of sadness usually happen in the period right after you have your baby, and they go away within a week or two. This is called the "baby blues." What are the causes? There is no known cause of baby blues. It is likely caused by a combination of factors. However, changes in hormone levels after childbirth are believed to trigger some of the symptoms. Other factors that can play a role in these mood changes include:  Lack of sleep.  Stressful life  events, such as poverty, caring for a loved one, or death of a loved one.  Genetics. What are the signs or symptoms? Symptoms of this condition include:  Brief changes in mood, such as going from extreme happiness to sadness.  Decreased concentration.  Difficulty sleeping.  Crying spells and tearfulness.  Loss of appetite.  Irritability.  Anxiety. If the symptoms of baby blues last for more than 2 weeks or become more severe, you may have postpartum depression. How is this diagnosed? This condition is diagnosed based on an evaluation of your  symptoms. There are no medical or lab tests that lead to a diagnosis, but there are various questionnaires that a health care provider may use to identify women with the baby blues or postpartum depression. How is this treated? Treatment is not needed for this condition. The baby blues usually go away on their own in 1-2 weeks. Social support is often all that is needed. You will be encouraged to get adequate sleep and rest. Follow these instructions at home: Lifestyle      Get as much rest as you can. Take a nap when the baby sleeps.  Exercise regularly as told by your health care provider. Some women find yoga and walking to be helpful.  Eat a balanced and nourishing diet. This includes plenty of fruits and vegetables, whole grains, and lean proteins.  Do little things that you enjoy. Have a cup of tea, take a bubble bath, read your favorite magazine, or listen to your favorite music.  Avoid alcohol.  Ask for help with household chores, cooking, grocery shopping, or running errands. Do not try to do everything yourself. Consider hiring a postpartum doula to help. This is a professional who specializes in providing support to new mothers.  Try not to make any major life changes during pregnancy or right after giving birth. This can add stress. General instructions  Talk to people close to you about how you are feeling. Get support  from your partner, family members, friends, or other new moms. You may want to join a support group.  Find ways to cope with stress. This may include: ? Writing your thoughts and feelings in a journal. ? Spending time outside. ? Spending time with people who make you laugh.  Try to stay positive in how you think. Think about the things you are grateful for.  Take over-the-counter and prescription medicines only as told by your health care provider.  Let your health care provider know if you have any concerns.  Keep all postpartum visits as told by your health care provider. This is important. Contact a health care provider if:  Your baby blues do not go away after 2 weeks. Get help right away if:  You have thoughts of taking your own life (suicidal thoughts).  You think you may harm the baby or other people.  You see or hear things that are not there (hallucinations). Summary  After giving birth, you may feel happy one minute and sad or stressed the next. Feelings of sadness that happen right after the baby is born and go away after a week or two are called the "baby blues."  You can manage the baby blues by getting enough rest, eating a healthy diet, exercising, spending time with supportive people, and finding ways to cope with stress.  If feelings of sadness and stress last longer than 2 weeks or get in the way of caring for your baby, talk to your health care provider. This may mean you have postpartum depression. This information is not intended to replace advice given to you by your health care provider. Make sure you discuss any questions you have with your health care provider. Document Revised: 08/15/2018 Document Reviewed: 06/19/2016 Elsevier Patient Education  2020 [employer].

## 2020-04-01 NOTE — Progress Notes (Signed)
Patient ID: Debra Mcdaniel, female   DOB: 07/21/1990, 29 y.o.   MRN: [id number]   Subjective:   She is doing well. She is ambulating in room. She is tolerating a regular diet. She has had flatus. She is voiding. Normal lochia. Pain is controlled  Objective:  Blood pressure 131/73, pulse 96, temperature 97.7 F (36.5 C), temperature source Oral, resp. rate 20, height 5\' 2"  (1.575 m), weight (!) 149.7 kg, last menstrual period 07/01/2019, SpO2 100 %, unknown if currently breastfeeding.  General: NAD Pulmonary: no increased work of breathing Abdomen: non-distended, non-tender, fundus firm at level of umbilicus Incision: Provena is in place Extremities: no edema, no erythema, no tenderness  Results for orders placed or performed during the hospital encounter of 03/30/20 (from the past 72 hour(s))  Fetal screen     Status: None   Collection Time: 03/31/20  6:42 AM  Result Value Ref Range   Fetal Screen      NEG Performed at [facility], [address] [place], [place]   Rhogam injection     Status: None (Preliminary result)   Collection Time: 03/31/20  6:42 AM  Result Value Ref Range   Unit Number 04/02/20    Blood Component Type RHIG    Unit division 00    Status of Unit ISSUED    Transfusion Status      OK TO TRANSFUSE Performed at [facility], [address] [place], [place]   CBC     Status: Abnormal   Collection Time: 03/31/20  6:42 AM  Result Value Ref Range   WBC 8.5 4.0 - 10.5 K/uL   RBC 3.18 (L) 3.87 - 5.11 MIL/uL   Hemoglobin 9.4 (L) 12.0 - 15.0 g/dL   HCT 04/02/20 (L) 36 - 46 %   MCV 93.1 80.0 - 100.0 fL   MCH 29.6 26.0 - 34.0 pg   MCHC 31.8 30.0 - 36.0 g/dL   RDW 93.8 18.2 - 99.3 %   Platelets 251 150 - 400 K/uL   nRBC 0.0 0.0 - 0.2 %    Comment: Performed at [facility], [address] [place], [place]     Assessment:   29 y.o. 26 postoperativeday # 2   Plan:  1) Acute blood loss  anemia - hemodynamically stable and asymptomatic - po ferrous sulfate  2) Blood Type --/--/O NEG (11/23 03-07-1996)   3) Rubella 3.13 (05/04 1551) / Varicella immune  4) TDAP status - up to date Immunization History  Administered Date(s) Administered  . Tdap 01/27/2020    5) Feeding- bottle   6) Contraception - Mirena IUD planned postpartum  7) DVT prophylaxis- Lovenox, patient needs injection teaching.   8) Disposition - home today.   01/29/2020 MD [facility], [facility] 04/01/2020 11:45 AM

## 2020-04-01 NOTE — Progress Notes (Signed)
Pt discharged with infant. Discharge instructions, prescriptions, and follow up appointments given to and reviewed with patient. Pt verbalized understanding. To be escorted out by auxillary.  °

## 2020-04-08 ENCOUNTER — Telehealth: Payer: Self-pay | Admitting: Obstetrics and Gynecology

## 2020-04-08 ENCOUNTER — Other Ambulatory Visit: Payer: Self-pay

## 2020-04-08 ENCOUNTER — Ambulatory Visit (INDEPENDENT_AMBULATORY_CARE_PROVIDER_SITE_OTHER): Payer: Managed Care, Other (non HMO) | Admitting: Obstetrics and Gynecology

## 2020-04-08 ENCOUNTER — Encounter: Payer: Self-pay | Admitting: Obstetrics and Gynecology

## 2020-04-08 VITALS — BP 128/72 | Ht 62.0 in | Wt 331.0 lb

## 2020-04-08 DIAGNOSIS — Z4889 Encounter for other specified surgical aftercare: Secondary | ICD-10-CM

## 2020-04-08 NOTE — Progress Notes (Signed)
      Postoperative Follow-up Patient presents post op from RLTCS 1weeks ago for repeat.  Subjective: Patient reports some improvement in her preop symptoms. Eating a regular diet without difficulty. Pain is not well controlled.  Medications being used: prescription NSAID's including ibuprofen (Motrin).  Activity: normal activities of daily living.  Objective: Blood pressure 128/72, height 5\' 2"  (1.575 m), weight (!) 331 lb (150.1 kg), unknown if currently breastfeeding.  General: NAD Pulmonary: no increased work of breathing Abdomen: soft, non-tender, non-distended, incision D/C/I (staples removed) Extremities: no edema Neurologic: normal gait    Admission on 03/30/2020, Discharged on 04/01/2020  Component Date Value Ref Range Status  . Fetal Screen 03/31/2020    Final                   Value:NEG Performed at [facility], [address] [place], [place]   . Unit Number 03/31/2020 04/02/2020   Final  . Blood Component Type 03/31/2020 RHIG   Final  . Unit division 03/31/2020 00   Final  . Status of Unit 03/31/2020 ISSUED,FINAL   Final  . Transfusion Status 03/31/2020    Final                   Value:OK TO TRANSFUSE Performed at [facility], [address], [place], [place]   . WBC 03/31/2020 8.5  4.0 - 10.5 K/uL Final  . RBC 03/31/2020 3.18* 3.87 - 5.11 MIL/uL Final  . Hemoglobin 03/31/2020 9.4* 12.0 - 15.0 g/dL Final  . HCT 04/02/2020 29.6* 36 - 46 % Final  . MCV 03/31/2020 93.1  80.0 - 100.0 fL Final  . MCH 03/31/2020 29.6  26.0 - 34.0 pg Final  . MCHC 03/31/2020 31.8  30.0 - 36.0 g/dL Final  . RDW 04/02/2020 14.6  11.5 - 15.5 % Final  . Platelets 03/31/2020 251  150 - 400 K/uL Final  . nRBC 03/31/2020 0.0  0.0 - 0.2 % Final   Performed at [facility], [address] [place], [place]    Assessment: 29 y.o. s/p RLTCS stable  Plan: Patient has done well after surgery with no apparent complications.   I have discussed the post-operative course to date, and the expected progress moving forward.  The patient understands what complications to be concerned about.  I will see the patient in routine follow up, or sooner if needed.    Activity plan: No heavy lifting   27, MD, [name], [facility] 04/08/2020, 2:30 PM

## 2020-04-08 NOTE — Telephone Encounter (Signed)
Patient coming in 05/17/2019 at 3:50 with Dr Bonney Aid for Mirena insertion.

## 2020-04-11 NOTE — Telephone Encounter (Signed)
Noted. Will order to arrive by apt date/time (05/16/2020)

## 2020-05-16 ENCOUNTER — Ambulatory Visit (INDEPENDENT_AMBULATORY_CARE_PROVIDER_SITE_OTHER): Payer: Managed Care, Other (non HMO) | Admitting: Obstetrics and Gynecology

## 2020-05-16 ENCOUNTER — Telehealth: Payer: Self-pay | Admitting: Obstetrics and Gynecology

## 2020-05-16 ENCOUNTER — Other Ambulatory Visit: Payer: Self-pay

## 2020-05-16 ENCOUNTER — Encounter: Payer: Self-pay | Admitting: Obstetrics and Gynecology

## 2020-05-16 VITALS — BP 126/72 | Ht 61.0 in | Wt 324.0 lb

## 2020-05-16 DIAGNOSIS — Z30430 Encounter for insertion of intrauterine contraceptive device: Secondary | ICD-10-CM

## 2020-05-16 DIAGNOSIS — O99345 Other mental disorders complicating the puerperium: Secondary | ICD-10-CM

## 2020-05-16 DIAGNOSIS — F53 Postpartum depression: Secondary | ICD-10-CM

## 2020-05-16 MED ORDER — SERTRALINE HCL 50 MG PO TABS: 50.0000 mg | ORAL_TABLET | Freq: Every day | ORAL | 2 refills | Status: DC

## 2020-05-16 NOTE — Progress Notes (Signed)
Postpartum Visit  Chief Complaint:  Chief Complaint  Patient presents with  . Post-op Follow-up    6wk postpartum, IUD insertion. RM 4    History of Present Illness: Patient is a 30 y.o. [id number] presents for postpartum visit.  Date of delivery: 03/30/2020 Cesarean Section: Elective repeat Pregnancy or labor problems:  no Any problems since the delivery:  no  Newborn Details:  SINGLETON :  1. BabyGender female. Maternal Details:  Breast or formula feeding: plans to bottle feed Intercourse: No  Contraception after delivery: No  Any bowel or bladder issues: No  Post partum depression/anxiety noted:  yes Edinburgh Post-Partum Depression Score:22 Date of last PAP: 01/08/2018  NIL and HR HPV negative   Review of Systems: Review of Systems  Constitutional: Negative.   Gastrointestinal: Negative.   Genitourinary: Negative.   Psychiatric/Behavioral: Positive for depression. Negative for hallucinations, memory loss, substance abuse and suicidal ideas. The patient is nervous/anxious and has insomnia.     The following portions of the patient's history were reviewed and updated as appropriate: allergies, current medications, past family history, past medical history, past social history, past surgical history and problem list.  Past Medical History:  Past Medical History:  Diagnosis Date  . Anxiety   . BMI 50.0-59.9, adult (HCC)   . Complication of anesthesia    has NV right before both csections  . Depression   . Family history of adverse reaction to anesthesia    mother but not sure what happened.  . Headache   . IBS (irritable bowel syndrome)   . PONV (postoperative nausea and vomiting)    had after 1 of her c sections    Past Surgical History:  Past Surgical History:  Procedure Laterality Date  . CESAREAN SECTION    . CESAREAN SECTION N/A 03/15/2019   Procedure: CESAREAN SECTION;  Surgeon: [name], MD;  Location: [facility];  Service: Obstetrics;  Laterality:  N/A;  . CESAREAN SECTION N/A 03/30/2020   Procedure: CESAREAN SECTION;  Surgeon: [name], MD;  Location: [facility];  Service: Obstetrics;  Laterality: N/A;  . COLONOSCOPY WITH PROPOFOL N/A 07/24/2017   Procedure: COLONOSCOPY WITH PROPOFOL;  Surgeon: Toledo, Boykin Nearing, MD;  Location: [facility];  Service: Gastroenterology;  Laterality: N/A;  . ESOPHAGOGASTRODUODENOSCOPY (EGD) WITH PROPOFOL N/A 07/24/2017   Procedure: ESOPHAGOGASTRODUODENOSCOPY (EGD) WITH PROPOFOL;  Surgeon: Toledo, Boykin Nearing, MD;  Location: [facility];  Service: Gastroenterology;  Laterality: N/A;  . MIrena   02/2013    Family History:  Family History  Problem Relation Age of Onset  . Hypertension Mother   . Hypothyroidism Mother   . Obesity Mother   . Thyroid disease Mother   . Hypertension Maternal Grandmother   . Hypertension Maternal Grandfather   . Hypertension Paternal Grandmother   . Hypertension Paternal Grandfather   . Vision loss Father   . Breast cancer Neg Hx   . Ovarian cancer Neg Hx     Social History:  Social History   Socioeconomic History  . Marital status: Married    Spouse name: marcus  . Number of children: 2  . Years of education: Not on file  . Highest education level: Not on file  Occupational History  . Occupation: [employer]  . Smoking status: Never Smoker  . Smokeless tobacco: Never Used  Vaping Use  . Vaping Use: Never used  Substance and Sexual Activity  . Alcohol use: Not Currently  . Drug use: No  .  Sexual activity: Yes    Birth control/protection: None  Other Topics Concern  . Not on file  Social History Narrative   ** Merged History Encounter **       Social Determinants of Health   Financial Resource Strain: Not on file  Food Insecurity: Not on file  Transportation Needs: Not on file  Physical Activity: Not on file  Stress: Not on file  Social Connections: Not on file  Intimate Partner Violence: Not on file    Allergies:   Allergies  Allergen Reactions  . Nickel Rash    Medications: Prior to Admission medications   Medication Sig Start Date End Date Taking? Authorizing Provider  acetaminophen (TYLENOL) 500 MG tablet Take 1,000 mg by mouth every 6 (six) hours as needed for moderate pain or headache.    [provider]  buPROPion (WELLBUTRIN XL) 300 MG 24 hr tablet Take 1 tablet (300 mg total) by mouth daily. 07/31/19   [name], MD  docusate sodium (COLACE) 100 MG capsule Take 1 capsule (100 mg total) by mouth 2 (two) times daily as needed. 04/01/20 04/01/21  Schuman, Jaquelyn Bitter, MD  enoxaparin (LOVENOX) 80 MG/0.8ML injection Inject 0.8 mLs (80 mg total) into the skin daily for 10 days. 04/02/20 04/12/20  Natale Milch, MD  Prenatal Vit-Fe Fumarate-FA (PRENATAL PO) Take 1 tablet by mouth daily.    [provider]    Physical Exam Blood pressure 126/72, height 5\' 1"  (1.549 m), weight (!) 324 lb (147 kg), last menstrual period 05/09/2020, unknown if currently breastfeeding.  General: NAD HEENT: normocephalic, anicteric Pulmonary: No increased work of breathing Abdomen: NABS, soft, non-tender, non-distended.  Umbilicus without lesions.  No hepatomegaly, splenomegaly or masses palpable. No evidence of hernia. Incision D/C/I Genitourinary:  External: Normal external female genitalia.  Normal urethral meatus, normal  Bartholin's and Skene's glands.    Vagina: Normal vaginal mucosa, no evidence of prolapse.    Cervix: Grossly normal in appearance, no bleeding  Uterus: Non-enlarged, mobile, normal contour.  No CMT  Adnexa: ovaries non-enlarged, no adnexal masses  Rectal: deferred Extremities: no edema, erythema, or tenderness Neurologic: Grossly intact Psychiatric: mood appropriate, affect full   GYNECOLOGY OFFICE PROCEDURE NOTE  Debra Mcdaniel is a 30 y.o. G3P3003 here for a Mirena IUD insertion. No GYN concerns.  Last pap smear was on 01/2018 and was normal.  The  patient is currently using postpartum state and abstinance for contraception and her LMP is Patient's last menstrual period was 05/09/2020..  The indication for her IUD is contraception/cycle control.  IUD Insertion Procedure Note Patient identified, informed consent performed, consent signed.   Discussed risks of irregular bleeding, cramping, infection, malpositioning, expulsion or uterine perforation of the IUD (1:1000 placements)  which may require further procedure such as laparoscopy.  IUD while effective at preventing pregnancy do not prevent transmission of sexually transmitted diseases and use of barrier methods for this purpose was discussed. Time out was performed.  Urine pregnancy test negative.  Speculum placed in the vagina.  Cervix visualized.  Cleaned with Betadine x 2.  Grasped anteriorly with a single tooth tenaculum.  Uterus sounded to 9 cm. IUD placed per manufacturer's recommendations.  Strings trimmed to 3 cm. Tenaculum was removed, good hemostasis noted.  Patient tolerated procedure well.   Patient was given post-procedure instructions.  She was advised to have backup contraception for one week.  Patient was also asked to check IUD strings periodically and follow up in 6 weeks for IUD check.  Edinburgh Postnatal Depression Scale - 05/16/20 1604      Edinburgh Postnatal Depression Scale:  In the Past 7 Days   I have been able to laugh and see the funny side of things. 2    I have looked forward with enjoyment to things. 2    I have blamed myself unnecessarily when things went wrong. 3    I have been anxious or worried for no good reason. 3    I have felt scared or panicky for no good reason. 3    Things have been getting on top of me. 2    I have been so unhappy that I have had difficulty sleeping. 2    I have felt sad or miserable. 3    I have been so unhappy that I have been crying. 2    The thought of harming myself has occurred to me. 0    Edinburgh Postnatal  Depression Scale Total 22           Assessment: 30 y.o. [id number] presenting for 6 week postpartum visit  Plan: Problem List Items Addressed This Visit   None   Visit Diagnoses    Postpartum depression    -  Primary   Relevant Medications   sertraline (ZOLOFT) 50 MG tablet      1) Contraception - Education given regarding options for contraception, as well as compatibility with breast feeding if applicable.  Patient plans on IUD for contraception.  2)  Pap - ASCCP guidelines and rational discussed.  ASCCP guidelines and rational discussed.  Patient opts for every 3 years screening interval  3) Patient underwent screening for postpartum depression - switch from Wellbutrin 300mg  to Zoloft 50mg  with plans to re-evaluate in 1 week and titrate up as necessary  4) Return in about 1 week (around 05/23/2020) for 1 week medication follow, 6 week IUD string check.   , MD, 05/25/2020 [facility], [facility] 05/16/2020, 4:45 PM

## 2020-05-16 NOTE — Telephone Encounter (Signed)
Pt called wanting to confirm prescription was sent over for her apt today at 3:50 pm. Pt requesting call back w/ conformation.

## 2020-05-17 ENCOUNTER — Telehealth: Payer: Self-pay

## 2020-05-17 NOTE — Telephone Encounter (Signed)
It can be pushed until her follow up visit next week, all we did was an IUD but she is having medications changed for depression

## 2020-05-17 NOTE — Telephone Encounter (Signed)
Pt is requesting to push her return to work date after appt yesterday. Please advise if this is ok to push until she is seen again in February.

## 2020-05-18 NOTE — Telephone Encounter (Signed)
Pt aware.

## 2020-05-19 NOTE — Telephone Encounter (Signed)
I'm ok with the follow up being over the phone

## 2020-05-27 ENCOUNTER — Ambulatory Visit (INDEPENDENT_AMBULATORY_CARE_PROVIDER_SITE_OTHER): Payer: Managed Care, Other (non HMO) | Admitting: Obstetrics and Gynecology

## 2020-05-27 ENCOUNTER — Other Ambulatory Visit: Payer: Self-pay

## 2020-05-27 DIAGNOSIS — F53 Postpartum depression: Secondary | ICD-10-CM

## 2020-05-27 DIAGNOSIS — O99345 Other mental disorders complicating the puerperium: Secondary | ICD-10-CM | POA: Diagnosis not present

## 2020-05-27 MED ORDER — VENLAFAXINE HCL ER 75 MG PO CP24: 75.0000 mg | ORAL_CAPSULE | Freq: Every day | ORAL | 3 refills | Status: DC

## 2020-05-27 NOTE — Progress Notes (Signed)
I connected with Debra Mcdaniel on 05/27/20 at  3:10 PM EST by telephone and verified that I am speaking with the correct person using two identifiers.   I discussed the limitations, risks, security and privacy concerns of performing an evaluation and management service by telephone and the availability of in person appointments. I also discussed with the patient that there may be a patient responsible charge related to this service. The patient expressed understanding and agreed to proceed.  The patient was at home I spoke with the patient from my workstation phone The names of people involved in this encounter were: Debra Mcdaniel , and Debra Mcdaniel   Obstetrics & Gynecology Office Visit   Chief Complaint:  Chief Complaint  Patient presents with  . Follow-up    Phone visit - medication f/u no change, not doing any better    History of Present Illness: The patient is a 30 y.o. female presenting follow up for symptoms of depression.  The patient is currently taking Zoloft 50mg  for the management of her symptoms.  She has not had any recent situational stressors other than recent child birth.  She reports symptoms of anhedonia, day time somnolence, insomnia, irritability, social anxiety, agorophobia, feelings of guilt and feelings of worthlessness.  She denies risk taking behavior, suicidal ideation, homicidal ideation, auditory hallucinations and visual hallucinations. Symptoms have remained unchanged since last visit.     The patient does have a pre-existing history of depression and anxiety.  She  does not a prior history of suicide attempts.  Previous treatment tied include Zoloft and Welbutrin  Review of Systems: Review of Systems  Constitutional: Negative.   Gastrointestinal: Negative for nausea.  Genitourinary: Negative.   Neurological: Positive for headaches.  Psychiatric/Behavioral: Positive for depression. Negative for hallucinations, memory loss, substance abuse and  suicidal ideas. The patient is nervous/anxious and has insomnia.      Past Medical History:  Past Medical History:  Diagnosis Date  . Anxiety   . BMI 50.0-59.9, adult (HCC)   . Complication of anesthesia    has NV right before both csections  . Depression   . Family history of adverse reaction to anesthesia    mother but not sure what happened.  . Headache   . IBS (irritable bowel syndrome)   . PONV (postoperative nausea and vomiting)    had after 1 of her c sections    Past Surgical History:  Past Surgical History:  Procedure Laterality Date  . CESAREAN SECTION    . CESAREAN SECTION N/A 03/15/2019   Procedure: CESAREAN SECTION;  Surgeon: 13/12/2018, MD;  Location: [facility];  Service: Obstetrics;  Laterality: N/A;  . CESAREAN SECTION N/A 03/30/2020   Procedure: CESAREAN SECTION;  Surgeon: 04/01/2020, MD;  Location: [facility];  Service: Obstetrics;  Laterality: N/A;  . COLONOSCOPY WITH PROPOFOL N/A 07/24/2017   Procedure: COLONOSCOPY WITH PROPOFOL;  Surgeon: Toledo, 07/26/2017, MD;  Location: [facility];  Service: Gastroenterology;  Laterality: N/A;  . ESOPHAGOGASTRODUODENOSCOPY (EGD) WITH PROPOFOL N/A 07/24/2017   Procedure: ESOPHAGOGASTRODUODENOSCOPY (EGD) WITH PROPOFOL;  Surgeon: Toledo, 07/26/2017, MD;  Location: [facility];  Service: Gastroenterology;  Laterality: N/A;  . MIrena   02/2013    Gynecologic History: Patient's last menstrual period was 05/09/2020.  Obstetric History: 07/07/2020  Family History:  Family History  Problem Relation Age of Onset  . Hypertension Mother   . Hypothyroidism Mother   . Obesity Mother   . Thyroid disease Mother   . Hypertension  Maternal Grandmother   . Hypertension Maternal Grandfather   . Hypertension Paternal Grandmother   . Hypertension Paternal Grandfather   . Vision loss Father   . Breast cancer Neg Hx   . Ovarian cancer Neg Hx     Social History:  Social History   Socioeconomic History  . Marital  status: Married    Spouse name: marcus  . Number of children: 2  . Years of education: Not on file  . Highest education level: Not on file  Occupational History  . Occupation: [employer]  . Smoking status: Never Smoker  . Smokeless tobacco: Never Used  Vaping Use  . Vaping Use: Never used  Substance and Sexual Activity  . Alcohol use: Not Currently  . Drug use: No  . Sexual activity: Yes    Birth control/protection: None  Other Topics Concern  . Not on file  Social History Narrative   ** Merged History Encounter **       Social Determinants of Health   Financial Resource Strain: Not on file  Food Insecurity: Not on file  Transportation Needs: Not on file  Physical Activity: Not on file  Stress: Not on file  Social Connections: Not on file  Intimate Partner Violence: Not on file    Allergies:  Allergies  Allergen Reactions  . Nickel Rash    Medications: Prior to Admission medications   Medication Sig Start Date End Date Taking? Authorizing Provider  acetaminophen (TYLENOL) 500 MG tablet Take 1,000 mg by mouth every 6 (six) hours as needed for moderate pain or headache.   Yes [provider]  docusate sodium (COLACE) 100 MG capsule Take 1 capsule (100 mg total) by mouth 2 (two) times daily as needed. 04/01/20 04/01/21 Yes Schuman, Jaquelyn Bitter, MD  Prenatal Vit-Fe Fumarate-FA (PRENATAL PO) Take 1 tablet by mouth daily.   Yes [provider]  sertraline (ZOLOFT) 50 MG tablet Take 1 tablet (50 mg total) by mouth daily. 05/16/20  Yes Debra Austria, MD  enoxaparin (LOVENOX) 80 MG/0.8ML injection Inject 0.8 mLs (80 mg total) into the skin daily for 10 days. 04/02/20 04/12/20  Natale Milch, MD    Physical Exam Vitals: There were no vitals filed for this visit. Patient's last menstrual period was 05/09/2020.  No physical exam as this was a remote telephone visit to promote social distancing during the current COVID-19 Pandemic     Edinburgh Postnatal Depression Scale - 05/27/20 1439      Edinburgh Postnatal Depression Scale:  In the Past 7 Days   I have been able to laugh and see the funny side of things. 2    I have looked forward with enjoyment to things. 2    I have blamed myself unnecessarily when things went wrong. 3    I have been anxious or worried for no good reason. 3    I have felt scared or panicky for no good reason. 2    Things have been getting on top of me. 3    I have been so unhappy that I have had difficulty sleeping. 3    I have felt sad or miserable. 2    I have been so unhappy that I have been crying. 2    The thought of harming myself has occurred to me. 0    Edinburgh Postnatal Depression Scale Total 22           No flowsheet data found.  Depression screen [facility] 2/9  12/24/2019 01/01/2019  Decreased Interest 0 0  Down, Depressed, Hopeless 0 0  PHQ - 2 Score 0 0    Depression screen [facility] 2/9 12/24/2019 01/01/2019  Decreased Interest 0 0  Down, Depressed, Hopeless 0 0  PHQ - 2 Score 0 0     Assessment: 30 y.o. [id number] follow up postpartum depression  Plan: Problem List Items Addressed This Visit   None   Visit Diagnoses    Postpartum depression    -  Primary   Relevant Medications   venlafaxine XR (EFFEXOR-XR) 75 MG 24 hr capsule      1) Postpartum Depression  - no change in EDPS 22 on Zoloft 50mg  with continued headaches and nausea after initiation - switch effexor XR 75mg  to see if more favorable side-effect profile - will initiate referral to [facility] perinatal mood disorder clinic and consideration of possible Zulresso infusion - recently got over COVID so some organic causes potentially complicating presentation  2) Thyroid and B12 screen has not been obtained previously  3) Telephone Time 22:52  4) Return in about 1 week (around 06/03/2020) for mediction follow (phone, in-person, or video visit).  [facility], MD, 06/05/2020 [facility], [facility] 05/27/2020, 3:27 PM

## 2020-06-02 ENCOUNTER — Ambulatory Visit (INDEPENDENT_AMBULATORY_CARE_PROVIDER_SITE_OTHER): Payer: Managed Care, Other (non HMO) | Admitting: Obstetrics and Gynecology

## 2020-06-02 ENCOUNTER — Other Ambulatory Visit: Payer: Self-pay

## 2020-06-02 DIAGNOSIS — F53 Postpartum depression: Secondary | ICD-10-CM | POA: Diagnosis not present

## 2020-06-02 DIAGNOSIS — F419 Anxiety disorder, unspecified: Secondary | ICD-10-CM | POA: Diagnosis not present

## 2020-06-02 DIAGNOSIS — F32A Depression, unspecified: Secondary | ICD-10-CM

## 2020-06-02 MED ORDER — HYDROXYZINE HCL 25 MG PO TABS: 25.0000 mg | ORAL_TABLET | Freq: Four times a day (QID) | ORAL | 2 refills | Status: DC | PRN

## 2020-06-02 NOTE — Progress Notes (Addendum)
I connected with Debra Mcdaniel 06/02/2020 at  4:10 PM EST by telephone and verified that I am speaking with the correct person using two identifiers.   I discussed the limitations, risks, security and privacy concerns of performing an evaluation and management service by telephone and the availability of in person appointments. I also discussed with the patient that there may be a patient responsible charge related to this service. The patient expressed understanding and agreed to proceed.  The patient was at home I spoke with the patient from my workstation phone The names of people involved in this encounter were: Baron Sane , and [name]   Obstetrics & Gynecology Office Visit   Chief Complaint:  Chief Complaint  Patient presents with  . Follow-up    Phone visit - F/U medication, doing a little better.    History of Present Illness: The patient is a 30 y.o. female presenting follow up for symptoms of anxiety and depression.  The patient is currently taking Effexor XR 75mg  for the management of her symptoms.  She has not had any recent situational stressors.  She reports symptoms of anhedonia, irritability, feelings of guilt and feelings of worthlessness.  She denies risk taking behavior, suicidal ideation, homicidal ideation, auditory hallucinations and visual hallucinations. Symptoms have improved since last visit.     The patient does have a pre-existing history of depression and anxiety.  She  does not a prior history of suicide attempts.  Previous treatment tied include Zoloft  Review of Systems: Review of Systems  Constitutional: Negative.   Gastrointestinal: Negative for nausea.  Neurological: Positive for headaches.  Psychiatric/Behavioral: Positive for depression. Negative for hallucinations, memory loss, substance abuse and suicidal ideas. The patient is nervous/anxious and has insomnia.    Past Medical History:  Past Medical History:  Diagnosis Date  .  Anxiety   . BMI 50.0-59.9, adult (HCC)   . Complication of anesthesia    has NV right before both csections  . Depression   . Family history of adverse reaction to anesthesia    mother but not sure what happened.  . Headache   . IBS (irritable bowel syndrome)   . PONV (postoperative nausea and vomiting)    had after 1 of her c sections    Past Surgical History:  Past Surgical History:  Procedure Laterality Date  . CESAREAN SECTION    . CESAREAN SECTION N/A 03/15/2019   Procedure: CESAREAN SECTION;  Surgeon: 13/12/2018, MD;  Location: [facility];  Service: Obstetrics;  Laterality: N/A;  . CESAREAN SECTION N/A 03/30/2020   Procedure: CESAREAN SECTION;  Surgeon: 04/01/2020, MD;  Location: [facility];  Service: Obstetrics;  Laterality: N/A;  . COLONOSCOPY WITH PROPOFOL N/A 07/24/2017   Procedure: COLONOSCOPY WITH PROPOFOL;  Surgeon: Toledo, 07/26/2017, MD;  Location: [facility];  Service: Gastroenterology;  Laterality: N/A;  . ESOPHAGOGASTRODUODENOSCOPY (EGD) WITH PROPOFOL N/A 07/24/2017   Procedure: ESOPHAGOGASTRODUODENOSCOPY (EGD) WITH PROPOFOL;  Surgeon: Toledo, 07/26/2017, MD;  Location: [facility];  Service: Gastroenterology;  Laterality: N/A;  . MIrena   02/2013    Gynecologic History: Patient's last menstrual period was 05/09/2020.  Obstetric History: 07/07/2020  Family History:  Family History  Problem Relation Age of Onset  . Hypertension Mother   . Hypothyroidism Mother   . Obesity Mother   . Thyroid disease Mother   . Hypertension Maternal Grandmother   . Hypertension Maternal Grandfather   . Hypertension Paternal Grandmother   . Hypertension Paternal Grandfather   .  Vision loss Father   . Breast cancer Neg Hx   . Ovarian cancer Neg Hx     Social History:  Social History   Socioeconomic History  . Marital status: Married    Spouse name: marcus  . Number of children: 2  . Years of education: Not on file  . Highest education level: Not on file   Occupational History  . Occupation: [employer]  . Smoking status: Never Smoker  . Smokeless tobacco: Never Used  Vaping Use  . Vaping Use: Never used  Substance and Sexual Activity  . Alcohol use: Not Currently  . Drug use: No  . Sexual activity: Yes    Birth control/protection: None  Other Topics Concern  . Not on file  Social History Narrative   ** Merged History Encounter **       Social Determinants of Health   Financial Resource Strain: Not on file  Food Insecurity: Not on file  Transportation Needs: Not on file  Physical Activity: Not on file  Stress: Not on file  Social Connections: Not on file  Intimate Partner Violence: Not on file    Allergies:  Allergies  Allergen Reactions  . Nickel Rash    Medications: Prior to Admission medications   Medication Sig Start Date End Date Taking? Authorizing Provider  acetaminophen (TYLENOL) 500 MG tablet Take 1,000 mg by mouth every 6 (six) hours as needed for moderate pain or headache.   Yes [provider]  docusate sodium (COLACE) 100 MG capsule Take 1 capsule (100 mg total) by mouth 2 (two) times daily as needed. 04/01/20 04/01/21 Yes Schuman, Jaquelyn Bitter, MD  Prenatal Vit-Fe Fumarate-FA (PRENATAL PO) Take 1 tablet by mouth daily.   Yes [provider]  venlafaxine XR (EFFEXOR-XR) 75 MG 24 hr capsule Take 1 capsule (75 mg total) by mouth daily. 05/27/20  Yes [name], MD  enoxaparin (LOVENOX) 80 MG/0.8ML injection Inject 0.8 mLs (80 mg total) into the skin daily for 10 days. 04/02/20 04/12/20  Natale Milch, MD    Physical Exam Vitals: There were no vitals filed for this visit. Patient's last menstrual period was 05/09/2020.  No physical exam as this was a remote telephone visit to promote social distancing during the current COVID-19 Pandemic    Edinburgh Postnatal Depression Scale - 06/02/20 1636      Edinburgh Postnatal Depression Scale:  In the Past 7 Days   I  have been able to laugh and see the funny side of things. 1    I have looked forward with enjoyment to things. 2    I have blamed myself unnecessarily when things went wrong. 2    I have been anxious or worried for no good reason. 1    I have felt scared or panicky for no good reason. 2    Things have been getting on top of me. 0    I have been so unhappy that I have had difficulty sleeping. 1    I have felt sad or miserable. 2    I have been so unhappy that I have been crying. 1    The thought of harming myself has occurred to me. 0    Edinburgh Postnatal Depression Scale Total 12           No flowsheet data found.  Depression screen [facility] 2/9 12/24/2019 01/01/2019  Decreased Interest 0 0  Down, Depressed, Hopeless 0 0  PHQ - 2 Score 0 0  Depression screen [facility] 2/9 12/24/2019 01/01/2019  Decreased Interest 0 0  Down, Depressed, Hopeless 0 0  PHQ - 2 Score 0 0     Assessment: 30 y.o. [id number] follow up anxiety and depression  Plan: Problem List Items Addressed This Visit   None     1) Postpartum depression  - most notable symptom still present is anxiety and sleep disturbances - good improvement following switch to effexor from zoloft - [facility] perinatal[facility] mood disorder clinic referral pending  2) Thyroid and B12 screen has not been obtained previously  3) Telephone time 12:5min  4) Return in about 25 days (around 06/27/2020) for Keep prior appointment for IUD string check .    [name], MD, [facility], [facility] 06/02/2020, 4:52 PM

## 2020-06-03 NOTE — Telephone Encounter (Signed)
Per AMS She may return now without restrictions Letter sent.

## 2020-06-27 ENCOUNTER — Ambulatory Visit: Payer: Managed Care, Other (non HMO) | Admitting: Obstetrics and Gynecology

## 2020-07-01 ENCOUNTER — Other Ambulatory Visit: Payer: Self-pay | Admitting: Obstetrics and Gynecology

## 2020-07-01 NOTE — Telephone Encounter (Signed)
Please advise 

## 2020-07-11 ENCOUNTER — Other Ambulatory Visit: Payer: Self-pay

## 2020-07-11 ENCOUNTER — Encounter: Payer: Self-pay | Admitting: Obstetrics and Gynecology

## 2020-07-11 ENCOUNTER — Ambulatory Visit (INDEPENDENT_AMBULATORY_CARE_PROVIDER_SITE_OTHER): Payer: Managed Care, Other (non HMO) | Admitting: Obstetrics and Gynecology

## 2020-07-11 VITALS — BP 122/76 | Wt 327.0 lb

## 2020-07-11 DIAGNOSIS — Z30431 Encounter for routine checking of intrauterine contraceptive device: Secondary | ICD-10-CM

## 2020-07-11 NOTE — Progress Notes (Signed)
Obstetrics & Gynecology Office Visit   Chief Complaint:  Chief Complaint  Patient presents with  . Follow-up    IUD string check - RM 4    History of Present Illness: 30 y.o. patient presenting for follow up of Mirena IUD placement 6+ weeks ago.  The indication for her IUD was contraception.  She denies any complications since her IUD placement.  Still having some occasional spotting.  is able to feel strings.    Review of Systems: Review of Systems  Constitutional: Negative for chills and fever.  HENT: Negative for congestion.   Respiratory: Negative for cough and shortness of breath.   Cardiovascular: Negative for chest pain and palpitations.  Gastrointestinal: Negative for abdominal pain, constipation, diarrhea, heartburn, nausea and vomiting.  Genitourinary: Negative for dysuria, frequency and urgency.  Skin: Negative for itching and rash.  Neurological: Negative for dizziness and headaches.  Endo/Heme/Allergies: Negative for polydipsia.  Psychiatric/Behavioral: Negative for depression.    Past Medical History:  Past Medical History:  Diagnosis Date  . Anxiety   . BMI 50.0-59.9, adult (HCC)   . Complication of anesthesia    has NV right before both csections  . Depression   . Family history of adverse reaction to anesthesia    mother but not sure what happened.  . Headache   . IBS (irritable bowel syndrome)   . PONV (postoperative nausea and vomiting)    had after 1 of her c sections    Past Surgical History:  Past Surgical History:  Procedure Laterality Date  . CESAREAN SECTION    . CESAREAN SECTION N/A 03/15/2019   Procedure: CESAREAN SECTION;  Surgeon: [name], MD;  Location: [facility];  Service: Obstetrics;  Laterality: N/A;  . CESAREAN SECTION N/A 03/30/2020   Procedure: CESAREAN SECTION;  Surgeon: [name], MD;  Location: [facility];  Service: Obstetrics;  Laterality: N/A;  . COLONOSCOPY WITH PROPOFOL N/A 07/24/2017   Procedure:  COLONOSCOPY WITH PROPOFOL;  Surgeon: Toledo, Boykin Nearing, MD;  Location: [facility];  Service: Gastroenterology;  Laterality: N/A;  . ESOPHAGOGASTRODUODENOSCOPY (EGD) WITH PROPOFOL N/A 07/24/2017   Procedure: ESOPHAGOGASTRODUODENOSCOPY (EGD) WITH PROPOFOL;  Surgeon: Toledo, Boykin Nearing, MD;  Location: [facility];  Service: Gastroenterology;  Laterality: N/A;  . MIrena   02/2013    Gynecologic History: No LMP recorded. (Menstrual status: IUD).  Obstetric History: [id number]  Family History:  Family History  Problem Relation Age of Onset  . Hypertension Mother   . Hypothyroidism Mother   . Obesity Mother   . Thyroid disease Mother   . Hypertension Maternal Grandmother   . Hypertension Maternal Grandfather   . Hypertension Paternal Grandmother   . Hypertension Paternal Grandfather   . Vision loss Father   . Breast cancer Neg Hx   . Ovarian cancer Neg Hx     Social History:  Social History   Socioeconomic History  . Marital status: Married    Spouse name: marcus  . Number of children: 2  . Years of education: Not on file  . Highest education level: Not on file  Occupational History  . Occupation: [employer]  . Smoking status: Never Smoker  . Smokeless tobacco: Never Used  Vaping Use  . Vaping Use: Never used  Substance and Sexual Activity  . Alcohol use: Not Currently  . Drug use: No  . Sexual activity: Yes    Birth control/protection: I.U.D.  Other Topics Concern  . Not on file  Social  History Narrative   ** Merged History Encounter **       Social Determinants of Health   Financial Resource Strain: Not on file  Food Insecurity: Not on file  Transportation Needs: Not on file  Physical Activity: Not on file  Stress: Not on file  Social Connections: Not on file  Intimate Partner Violence: Not on file    Allergies:  Allergies  Allergen Reactions  . Nickel Rash    Medications: Prior to Admission medications   Medication Sig Start Date End  Date Taking? Authorizing Provider  acetaminophen (TYLENOL) 500 MG tablet Take 1,000 mg by mouth every 6 (six) hours as needed for moderate pain or headache.   Yes [provider]  docusate sodium (COLACE) 100 MG capsule Take 1 capsule (100 mg total) by mouth 2 (two) times daily as needed. 04/01/20 04/01/21 Yes Schuman, Jaquelyn Bitter, MD  hydrOXYzine (ATARAX/VISTARIL) 25 MG tablet Take 1 tablet (25 mg total) by mouth every 6 (six) hours as needed for anxiety. 06/02/20  Yes [name], MD  venlafaxine XR (EFFEXOR-XR) 75 MG 24 hr capsule TAKE 1 CAPSULE BY MOUTH EVERY DAY 07/03/20  Yes [name], MD  enoxaparin (LOVENOX) 80 MG/0.8ML injection Inject 0.8 mLs (80 mg total) into the skin daily for 10 days. 04/02/20 04/12/20  Natale Milch, MD  Prenatal Vit-Fe Fumarate-FA (PRENATAL PO) Take 1 tablet by mouth daily. Patient not taking: Reported on 07/11/2020    [provider]    Physical Exam Blood pressure 122/76, weight (!) 327 lb (148.3 kg), unknown if currently breastfeeding. No LMP recorded. (Menstrual status: IUD).  General: NAD HEENT: normocephalic, anicteric Pulmonary: No increased work of breathing  Genitourinary:  External: Normal external female genitalia.  Normal urethral meatus, normal  Bartholin's and Skene's glands.    Vagina: Normal vaginal mucosa, no evidence of prolapse.    Cervix: Grossly normal in appearance, no bleeding, IUD strings visualized 2cm  Uterus: Non-enlarged, mobile, normal contour.  No CMT  Adnexa: ovaries non-enlarged, no adnexal masses  Rectal: deferred  Lymphatic: no evidence of inguinal lymphadenopathy Extremities: no edema, erythema, or tenderness Neurologic: Grossly intact Psychiatric: mood appropriate, affect full  Female chaperone present for pelvic and breast  portions of the physical exam  Assessment: 30 y.o. [id number] IUD string check  Plan: Problem List Items Addressed This Visit   None   Visit Diagnoses     IUD check up    -  Primary       1.  The patient was given instructions to check her IUD strings monthly and call with any problems or concerns.  She should call for fevers, chills, abnormal vaginal discharge, pelvic pain, or other complaints.  2.   IUDs while effective at preventing pregnancy do not prevent transmission of sexually transmitted diseases and use of barrier methods for this purpose was discussed.  Low overall incidence of failure with 99.7% efficacy rate in typical use.  The patient has not contraindication to IUD placement.  3.  She will return for a annual exam in 1 year.  All questions answered.  4) A total of 15 minutes were spent in face-to-face contact with the patient during this encounter with over half of that time devoted to counseling and coordination of care.  5) Return in about 1 year (around 07/11/2021) for annual.   [name], MD, [facility], [facility] 07/11/2020, 4:43 PM

## 2020-08-29 ENCOUNTER — Other Ambulatory Visit: Payer: Self-pay | Admitting: Obstetrics and Gynecology

## 2020-08-29 DIAGNOSIS — N939 Abnormal uterine and vaginal bleeding, unspecified: Secondary | ICD-10-CM

## 2020-08-29 DIAGNOSIS — Z975 Presence of (intrauterine) contraceptive device: Secondary | ICD-10-CM

## 2020-10-06 ENCOUNTER — Other Ambulatory Visit: Payer: Self-pay | Admitting: Obstetrics and Gynecology

## 2020-10-06 DIAGNOSIS — Z1329 Encounter for screening for other suspected endocrine disorder: Secondary | ICD-10-CM

## 2020-10-06 DIAGNOSIS — R5383 Other fatigue: Secondary | ICD-10-CM

## 2020-10-06 DIAGNOSIS — D649 Anemia, unspecified: Secondary | ICD-10-CM

## 2020-10-06 DIAGNOSIS — N939 Abnormal uterine and vaginal bleeding, unspecified: Secondary | ICD-10-CM

## 2020-10-06 NOTE — Progress Notes (Signed)
f °

## 2020-10-06 NOTE — Telephone Encounter (Signed)
I put in some labs for the patient's symptoms

## 2020-10-17 ENCOUNTER — Telehealth: Payer: Self-pay

## 2020-10-17 NOTE — Telephone Encounter (Signed)
No needs the ultrasound to look at placement

## 2020-10-17 NOTE — Telephone Encounter (Signed)
Pt calling; had u/s appt to ck on mirena; was unable to make it to appt; wants to know if it's possible to schedule and pap smear and be sure mirena is good to go instead of resch u/s?  [phone]

## 2020-10-18 ENCOUNTER — Ambulatory Visit: Payer: Managed Care, Other (non HMO) | Admitting: Obstetrics and Gynecology

## 2020-10-18 NOTE — Telephone Encounter (Signed)
Yes ok to wait

## 2020-10-19 NOTE — Telephone Encounter (Signed)
Called and spoke with patient about rescheduling annual. Patient is rescheduled with AMS on 12/26/20 . Patient will call back to r/s if need too.

## 2020-11-10 IMAGING — US US MFM OB FOLLOW UP
1 series · 12 of 28 positions shown · non-contrast
Comparison: none

PATIENT INFO:

PERFORMED BY:
                   Sonographer
SERVICE(S) PROVIDED:
 ----------------------------------------------------------------------
INDICATIONS:
  28 weeks gestation of pregnancy
  Elevated BMI (59)
  Growth
  History of prior c-section
FETAL EVALUATION:
 Num Of Fetuses:         1
 Fetal Heart Rate(bpm):  147
 Cardiac Activity:       Present
 Presentation:           Cephalic
 Placenta:               Fundal, No previa [REDACTED] previously
 Amniotic Fluid
 AFI FV:      Within normal limits
 AFI Sum(cm)     %Tile
 15.8            57
BIOMETRY:
 BPD:      72.5  mm     G. Age:  29w 1d         50  %    CI:        76.46   %    70 - 86
                                                         FL/HC:      20.3   %    19.6 -
 HC:      262.7  mm     G. Age:  28w 4d         17  %    HC/AC:      1.07        0.99 -
 AC:       245   mm     G. Age:  28w 5d         44  %    FL/BPD:     73.5   %    71 - 87
 FL:       53.3  mm     G. Age:  28w 2d         24  %    FL/AC:      21.8   %    20 - 24
 Est. FW:    3481  gm    2 lb 12 oz      36  %
GESTATIONAL AGE:
 LMP:           28w 5d        Date:  06/14/18                 EDD:   03/21/19
 U/S Today:     28w 5d                                        EDD:   03/21/19
 Best:          28w 5d     Det. By:  LMP  (06/14/18)          EDD:   03/21/19
ANATOMY:
 Cavum:                 Visualized             Aortic Arch:            Normal appearance
                        previously
 Ventricles:            Normal appearance      Stomach:                Seen
 Cerebellum:            Visualized             Abdominal Wall:         Within Normal Limits
 Posterior Fossa:       Visualized             Cord Vessels:           3 vessels,
                        previously                                     visualized previously
 Face:                  Orbits visualized      Kidneys:                Normal appearance
 Lips:                  Normal appearance      Bladder:                Seen
 Heart:                 4-Chamber view         Spine:                  Normal appearance
                        appears normal
 RVOT:                  Normal appearance      Upper Extremities:      Visualized
                                                                       previously
 LVOT:                  Suboptimal             Lower Extremities:      Visualized
 Other:  Ao/Pa normal appearance

[Series 1: us mfm ob follow up · 0.25mm/px · 12 of 54 slices shown]
[im 2/54]
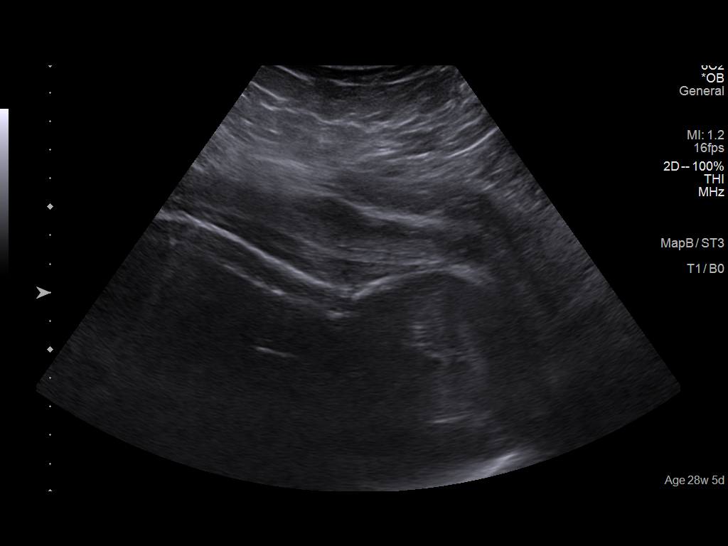
[im 6/54]
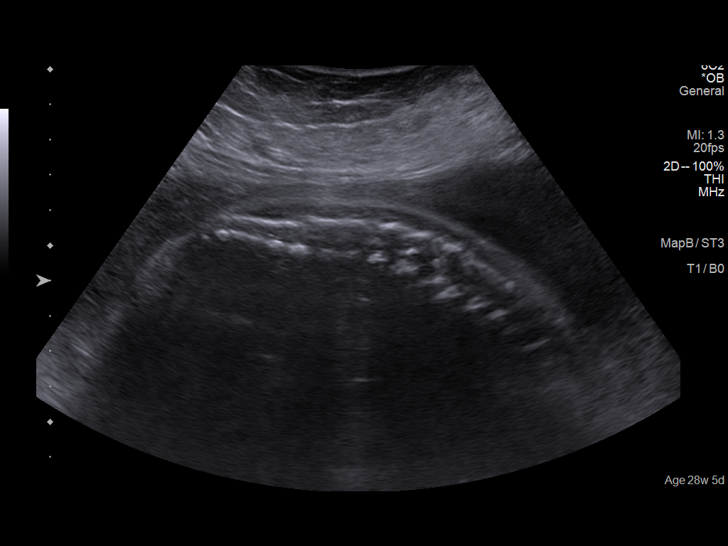
[im 10/54]
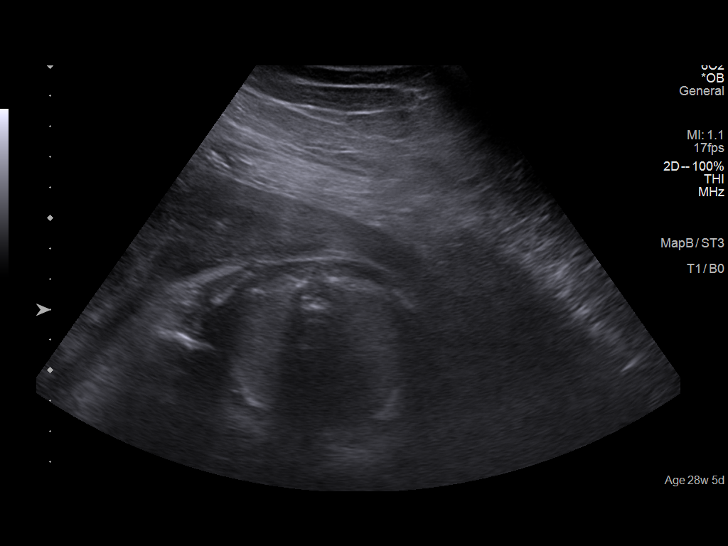
[im 16/54]
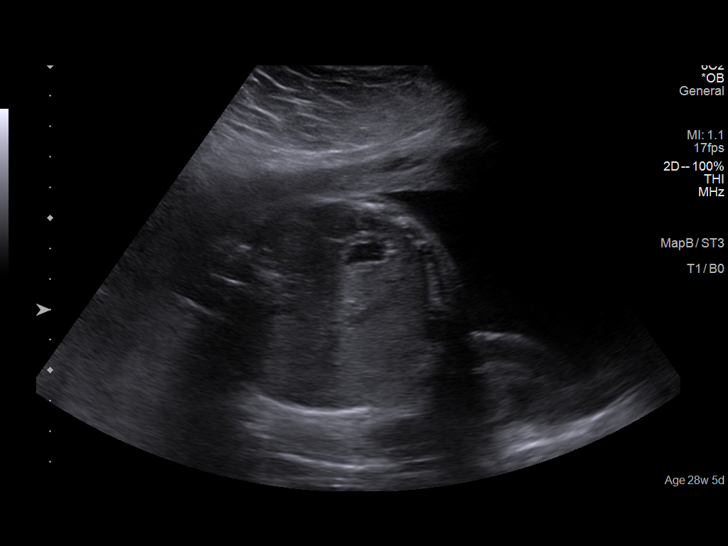
[im 20/54]
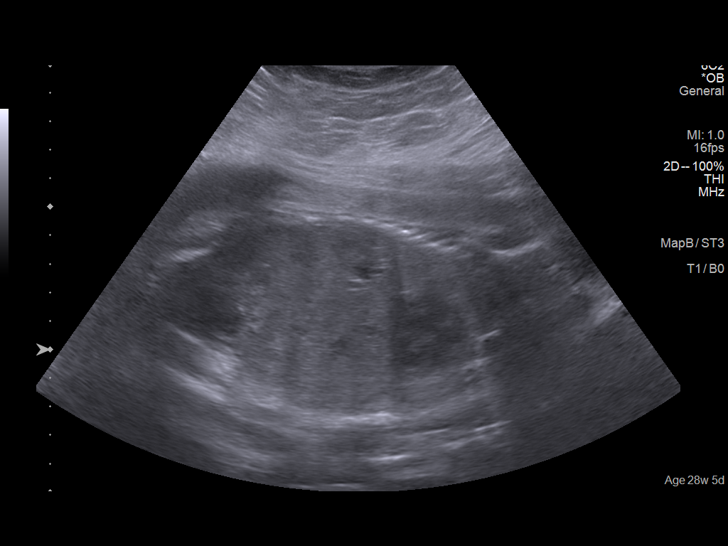
[im 24/54]
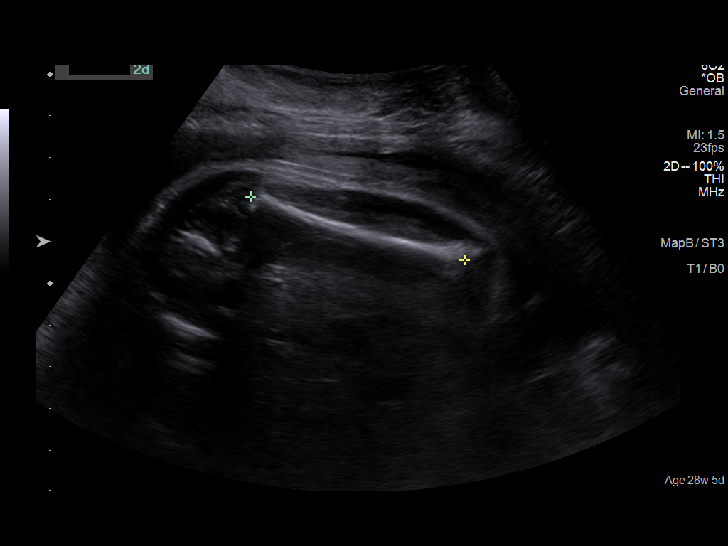
[im 30/54]
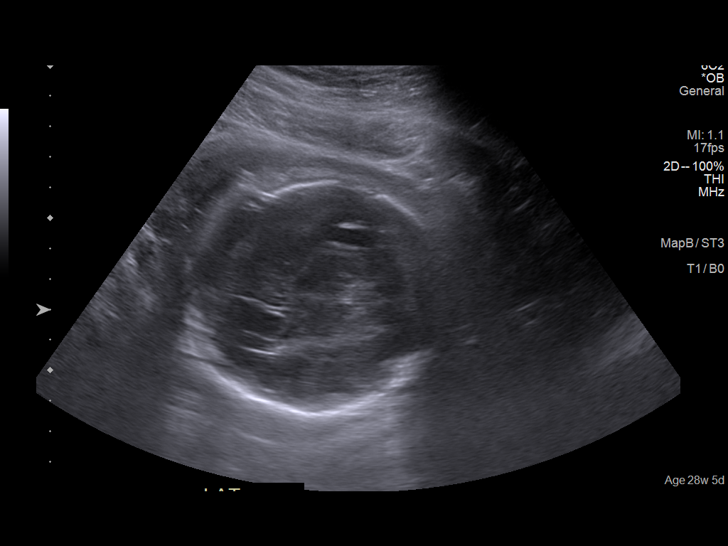
[im 34/54]
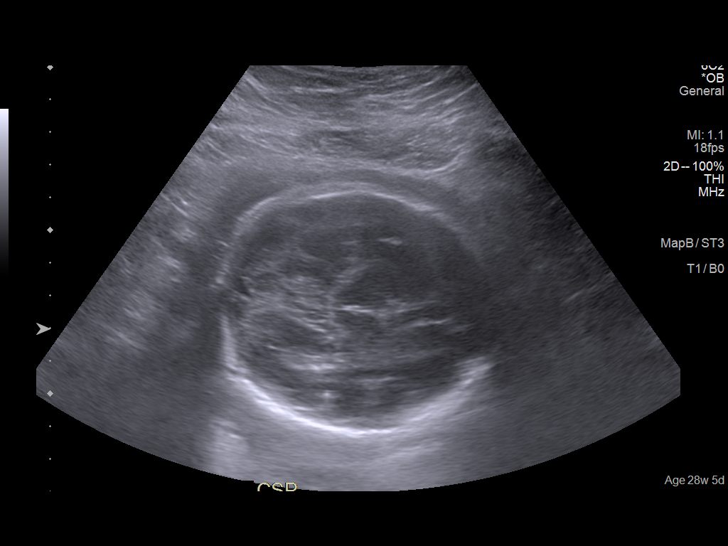
[im 38/54]
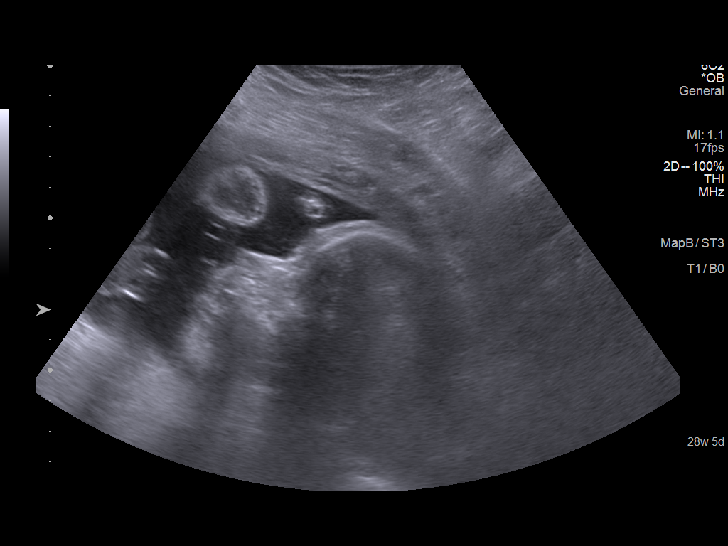
[im 44/54]
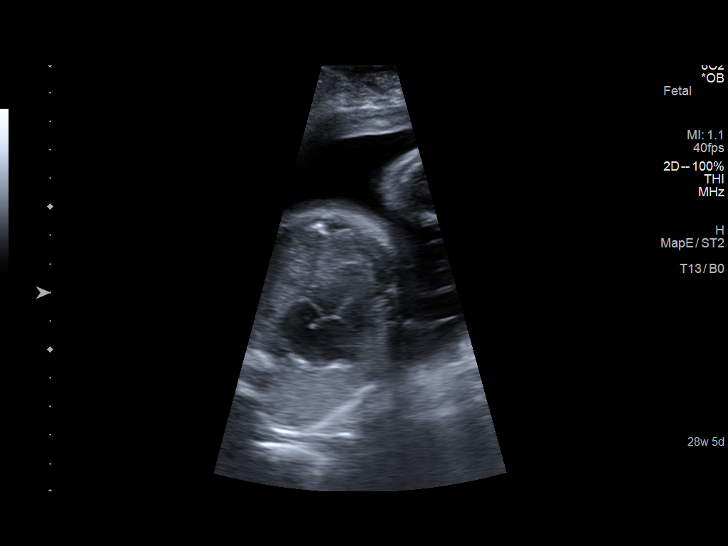
[im 48/54]
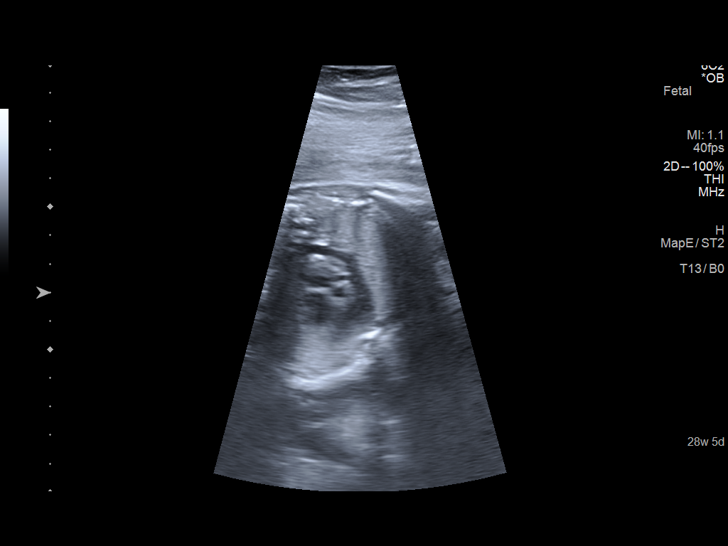
[im 52/54]
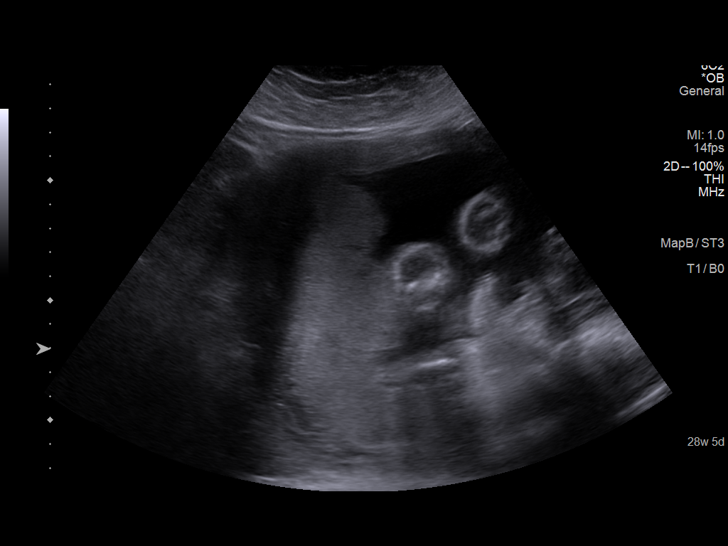

[12 of 28 positions shown; findings below may reference images not displayed]

IMPRESSION: Intrauterine pregnancy with a best estimated gestational age
 of 28 weeks 5 days.  Dating is based on LMP consistent with
 earliest available ultrasound performed at [facility]
 on 09/03/2018; measurements were reported as 12 weeks 0
 days.

 Follow up ultrasound performed to assess growth due to
 morbid obesity (BMI 59).

 With the exception of the LVOT (which was not visualized but
 has been previously documented), fetal anatomy views
 appear normal or have been previously documented.

 Appropriate interval growth.

 Findings were discussed.  Recommend follow up ultrasound
 for growth every 4 weeks and antenatal testing, starting at 36
 weeks, unless indicated earlier.  Ms. Rodris reports that
 growth ultrasounds are planned to be performed in the office.
 We would be more than happy to see Ms. Eurys Armbruster for any
 reason, if ultrasound or delivery planning recommendations
 are desired.
                 Maheso, Pohana

## 2020-11-24 ENCOUNTER — Ambulatory Visit: Payer: Managed Care, Other (non HMO) | Admitting: Obstetrics and Gynecology

## 2020-12-22 ENCOUNTER — Other Ambulatory Visit: Payer: Self-pay | Admitting: Obstetrics and Gynecology

## 2020-12-22 DIAGNOSIS — T8332XA Displacement of intrauterine contraceptive device, initial encounter: Secondary | ICD-10-CM

## 2020-12-26 ENCOUNTER — Ambulatory Visit: Payer: Managed Care, Other (non HMO) | Admitting: Obstetrics and Gynecology

## 2021-01-05 ENCOUNTER — Ambulatory Visit (INDEPENDENT_AMBULATORY_CARE_PROVIDER_SITE_OTHER): Payer: BC Managed Care – PPO | Admitting: Obstetrics and Gynecology

## 2021-01-05 ENCOUNTER — Other Ambulatory Visit (HOSPITAL_COMMUNITY)
Admission: RE | Admit: 2021-01-05 | Discharge: 2021-01-05 | Disposition: A | Discharge status: Home / Self Care | Payer: BC Managed Care – PPO | Source: Ambulatory Visit | Attending: Obstetrics and Gynecology | Admitting: Obstetrics and Gynecology

## 2021-01-05 ENCOUNTER — Encounter: Payer: Self-pay | Admitting: Obstetrics and Gynecology

## 2021-01-05 ENCOUNTER — Other Ambulatory Visit: Payer: Self-pay

## 2021-01-05 VITALS — BP 126/64 | HR 90 | Ht 62.0 in | Wt 330.0 lb

## 2021-01-05 DIAGNOSIS — Z308 Encounter for other contraceptive management: Secondary | ICD-10-CM | POA: Diagnosis not present

## 2021-01-05 DIAGNOSIS — Z124 Encounter for screening for malignant neoplasm of cervix: Secondary | ICD-10-CM | POA: Diagnosis not present

## 2021-01-05 DIAGNOSIS — Z01419 Encounter for gynecological examination (general) (routine) without abnormal findings: Secondary | ICD-10-CM | POA: Diagnosis not present

## 2021-01-05 DIAGNOSIS — Z1239 Encounter for other screening for malignant neoplasm of breast: Secondary | ICD-10-CM

## 2021-01-05 DIAGNOSIS — T8332XA Displacement of intrauterine contraceptive device, initial encounter: Secondary | ICD-10-CM

## 2021-01-05 NOTE — Progress Notes (Signed)
Gynecology Annual Exam   PCP: Barbette Reichmann, MD  Chief Complaint:  Chief Complaint  Patient presents with   Gynecologic Exam    Annual - no concerns. RM 3    History of Present Illness: Patient is a 30 y.o. G3P3003 presents for annual exam. The patient has no complaints today.   LMP: No LMP recorded. (Menstrual status: IUD). Had had continued breakthrough bleeding on Mirena IUD placed postpartum 05/16/2020.  She had an ultrasound ordered to evaluated IUD placement Ultrasound 12/21/2020 to evaluate placement did not included 3D reconstructed images showed the IUD present in the lower uterine segment but the actual position of the IUD in the fundal portion was limited by the presence of uterine gas.  The patient has had not pain or discomfort and has since reported amenorrhea over the past month.  The patient is sexually active. She currently uses IUD for contraception. She denies dyspareunia. There is no notable family history of breast or ovarian cancer in her family.  The patient wears seatbelts: yes.   The patient has regular exercise: not asked.    The patient denies current symptoms of depression.    Review of Systems: Review of Systems  Constitutional:  Negative for chills and fever.  HENT:  Negative for congestion.   Respiratory:  Negative for cough and shortness of breath.   Cardiovascular:  Negative for chest pain and palpitations.  Gastrointestinal:  Negative for abdominal pain, constipation, diarrhea, heartburn, nausea and vomiting.  Genitourinary:  Negative for dysuria, frequency and urgency.  Skin:  Negative for itching and rash.  Neurological:  Negative for dizziness and headaches.  Endo/Heme/Allergies:  Negative for polydipsia.  Psychiatric/Behavioral:  Negative for depression.    Past Medical History:  Patient Active Problem List   Diagnosis Date Noted   Labile blood pressure 12/04/2018    Pt states white coat , fluctuates when she comes for  visits Frequently will have systolic BP in 140 range     BMI 50.0-59.9, adult (HCC) 08/29/2018    BMI >=40 [ ]  early 1h gtt -  [ ]  u/s for dating [ ]   [ ]  nutritional goals [ ]  folic acid 1mg  [ ]  bASA (>12 weeks) [ ]  consider nutrition consult [ ]  consider maternal EKG 1st trimester [ ]  Growth u/s 28 [ ] , 32 [ ] , 36 weeks [ ]  [ ]  NST/AFI weekly 36+ weeks (36[] , 37[] , 38[] , 39[] , 40[] ) [ ]  IOL by 41 weeks (scheduled, prn [] )    GAD (generalized anxiety disorder) 11/15/2017    Patient has difficulty getting over things impacts her daily life affects her GI disturbances and her HA s Offered Zoloft     Headache disorder 11/15/2017    Offered referral to neurology  Reports normal ophthalmology exam annually     Intractable chronic migraine without aura and without status migrainosus 09/04/2017   Irritable bowel syndrome with diarrhea 05/17/2017    Negative upper GI and colonoscopy in 2019      Past Surgical History:  Past Surgical History:  Procedure Laterality Date   CESAREAN SECTION     CESAREAN SECTION N/A 03/15/2019   Procedure: CESAREAN SECTION;  Surgeon: , MD;  Location: [facility];  Service: Obstetrics;  Laterality: N/A;   CESAREAN SECTION N/A 03/30/2020   Procedure: CESAREAN SECTION;  Surgeon: , MD;  Location: [facility];  Service: Obstetrics;  Laterality: N/A;   COLONOSCOPY WITH PROPOFOL N/A 07/24/2017   Procedure: COLONOSCOPY WITH PROPOFOL;  Surgeon:  Toledo, Boykin Nearing, MD;  Location: [facility];  Service: Gastroenterology;  Laterality: N/A;   ESOPHAGOGASTRODUODENOSCOPY (EGD) WITH PROPOFOL N/A 07/24/2017   Procedure: ESOPHAGOGASTRODUODENOSCOPY (EGD) WITH PROPOFOL;  Surgeon: Toledo, Boykin Nearing, MD;  Location: [facility];  Service: Gastroenterology;  Laterality: N/A;   MIrena   02/2013    Gynecologic History:  No LMP recorded. (Menstrual status: IUD). Contraception: IUD Last Pap: Results were: 01/08/2018 NILM    Obstetric History:  [id number]  Family History:  Family History  Problem Relation Age of Onset   Hypertension Mother    Hypothyroidism Mother    Obesity Mother    Thyroid disease Mother    Hypertension Maternal Grandmother    Hypertension Maternal Grandfather    Hypertension Paternal Grandmother    Hypertension Paternal Grandfather    Vision loss Father    Breast cancer Neg Hx    Ovarian cancer Neg Hx     Social History:  Social History   Socioeconomic History   Marital status: Married    Spouse name: marcus   Number of children: 2   Years of education: Not on file   Highest education level: Not on file  Occupational History   Occupation: [employer]  Tobacco Use   Smoking status: Never   Smokeless tobacco: Never  Vaping Use   Vaping Use: Never used  Substance and Sexual Activity   Alcohol use: Not Currently   Drug use: No   Sexual activity: Yes    Birth control/protection: I.U.D.  Other Topics Concern   Not on file  Social History Narrative   ** Merged History Encounter **       Social Determinants of Health   Financial Resource Strain: Not on file  Food Insecurity: Not on file  Transportation Needs: Not on file  Physical Activity: Not on file  Stress: Not on file  Social Connections: Not on file  Intimate Partner Violence: Not on file    Allergies:  Allergies  Allergen Reactions   Nickel Rash    Medications: Prior to Admission medications   Medication Sig Start Date End Date Taking? Authorizing Provider  acetaminophen (TYLENOL) 500 MG tablet Take 1,000 mg by mouth every 6 (six) hours as needed for moderate pain or headache.    [provider]  docusate sodium (COLACE) 100 MG capsule Take 1 capsule (100 mg total) by mouth 2 (two) times daily as needed. 04/01/20 04/01/21  Schuman, Jaquelyn Bitter, MD  enoxaparin (LOVENOX) 80 MG/0.8ML injection Inject 0.8 mLs (80 mg total) into the skin daily for 10 days. 04/02/20 04/12/20  Natale Milch, MD  hydrOXYzine  (ATARAX/VISTARIL) 25 MG tablet Take 1 tablet (25 mg total) by mouth every 6 (six) hours as needed for anxiety. 06/02/20   [name], MD  Prenatal Vit-Fe Fumarate-FA (PRENATAL PO) Take 1 tablet by mouth daily. Patient not taking: Reported on 07/11/2020    [provider]  venlafaxine XR (EFFEXOR-XR) 75 MG 24 hr capsule TAKE 1 CAPSULE BY MOUTH EVERY DAY 07/03/20   [name], MD    Physical Exam Vitals: Blood pressure 126/64, pulse 90, height 5\' 2"  (1.575 m), weight (!) 330 lb (149.7 kg), not currently breastfeeding. Body mass index is 60.36 kg/m.   General: NAD HEENT: normocephalic, anicteric Thyroid: no enlargement, no palpable nodules Pulmonary: No increased work of breathing, CTAB Cardiovascular: RRR, distal pulses 2+ Breast: Breast symmetrical, no tenderness, no palpable nodules or masses, no skin or nipple retraction present, no nipple discharge.  No axillary or supraclavicular  lymphadenopathy. Abdomen: NABS, soft, non-tender, non-distended.  Umbilicus without lesions.  No hepatomegaly, splenomegaly or masses palpable. No evidence of hernia  Genitourinary:  External: Normal external female genitalia.  Normal urethral meatus, normal Bartholin's and Skene's glands.    Vagina: Normal vaginal mucosa, no evidence of prolapse.    Cervix: Grossly normal in appearance, no bleeding  Uterus: Non-enlarged, mobile, normal contour.  No CMT, IUD string visualized  Adnexa: ovaries non-enlarged, no adnexal masses  Rectal: deferred  Lymphatic: no evidence of inguinal lymphadenopathy Extremities: no edema, erythema, or tenderness Neurologic: Grossly intact Psychiatric: mood appropriate, affect full  Female chaperone present for pelvic and breast  portions of the physical exam   Assessment: 30 y.o. G3P3003 routine annual exam  Plan: Problem List Items Addressed This Visit   None Visit Diagnoses     Encounter for gynecological examination without abnormal finding    -   Primary   Screening for malignant neoplasm of cervix       Relevant Orders   Cytology - PAP (Completed)   Breast screening       Encounter for tubal ligation counseling       Intrauterine device (IUD) migration, initial encounter           1) STI screening  was notoffered and therefore not obtained  2)  ASCCP guidelines and rational discussed.  Patient opts for every 3 years screening interval  3) Contraception - the patient is currently using  IUD.  She is interested in changing to tubal ligation - post for BTL and IUD removal.  We had discussed follow up CT imaging or MRI to futher evaluate IUD placement.  Until correct placement is confirmed I recommended use of back up contraception  - 30 y.o. [id number]  with undesired fertility, desires permanent sterilization.  Other reversible forms of contraception were discussed with patient; she declines all other modalities. Permanent nature of as well as associated risks of the procedure discussed with patient including but not limited to: risk of regret, permanence of method, bleeding, infection, injury to surrounding organs and need for additional procedures.  Failure risk of 0.5-1% with increased risk of ectopic gestation if pregnancy occurs was also discussed with patient.     4) Routine healthcare maintenance including cholesterol, diabetes screening discussed managed by PCP  5) No follow-ups on file.   [name], MD, [facility], [facility] 01/05/2021, 10:56 AM

## 2021-01-06 LAB — CYTOLOGY - PAP
Comment: NEGATIVE
Diagnosis: NEGATIVE
High risk HPV: NEGATIVE

## 2021-01-11 ENCOUNTER — Other Ambulatory Visit: Payer: Self-pay

## 2021-01-13 ENCOUNTER — Telehealth: Payer: Self-pay

## 2021-01-13 NOTE — Telephone Encounter (Signed)
Returned patient's call to schedule laparoscopic bilateral salpingectomy and Mirena IUD removal w Staebler  DOS 10/11  H&P 10/4 @ 3:50 Post op 1 week - 10/20 @ 4:10 Post op 6 week -  11/22 @ 4:10  Pre-admit phone call appointment to be requested - date and time will be included on H&P paper work. Also all appointments will be updated on pt MyChart. Explained that this appointment has a call window. Based on the time scheduled will indicate if the call will be received within a 4 hour window before 1:00 or after.  Advised that pt may also receive calls from the hospital pharmacy and pre-service center.  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 No secondary insurance.

## 2021-01-13 NOTE — Telephone Encounter (Signed)
-----   Message from [name], MD sent at 01/07/2021  8:10 PM EDT ----- Regarding: Surgery Surgery Booking Request Patient Full Name:  Debra Mcdaniel  MRN: [id number]  DOB: Dec 23, 1990  Surgeon: [name], MD  Requested Surgery Date and Time: 2-8 weeks Primary Diagnosis AND Code: unwanted fertility Secondary Diagnosis and Code: IUD migration Surgical Procedure: laparoscopic bilateral salpingectomy and Mirena IUD removal RNFA Requested?: No L&D Notification: No Admission Status: same day surgery Length of Surgery: 50 min Special Case Needs: No H&P: Yes Phone Interview???:  No Interpreter: No Medical Clearance:  No Special Scheduling Instructions: No Any known health/anesthesia issues, diabetes, sleep apnea, latex allergy, defibrillator/pacemaker?: No Acuity: P3   (P1 highest, P2 delay may cause harm, P3 low, elective gyn, P4 lowest) Post op follow up visits: 1 and 6 weeks

## 2021-01-19 ENCOUNTER — Ambulatory Visit: Payer: BC Managed Care – PPO | Admitting: Obstetrics and Gynecology

## 2021-01-27 ENCOUNTER — Ambulatory Visit: Payer: BC Managed Care – PPO

## 2021-01-30 ENCOUNTER — Other Ambulatory Visit (HOSPITAL_COMMUNITY)
Admission: RE | Admit: 2021-01-30 | Discharge: 2021-01-30 | Disposition: A | Discharge status: Home / Self Care | Payer: BC Managed Care – PPO | Source: Ambulatory Visit | Attending: Obstetrics and Gynecology | Admitting: Obstetrics and Gynecology

## 2021-01-30 ENCOUNTER — Encounter: Payer: Self-pay | Admitting: Obstetrics and Gynecology

## 2021-01-30 ENCOUNTER — Other Ambulatory Visit: Payer: Self-pay

## 2021-01-30 ENCOUNTER — Ambulatory Visit (INDEPENDENT_AMBULATORY_CARE_PROVIDER_SITE_OTHER): Payer: BC Managed Care – PPO | Admitting: Obstetrics and Gynecology

## 2021-01-30 VITALS — BP 126/72 | Wt 337.0 lb

## 2021-01-30 DIAGNOSIS — Z113 Encounter for screening for infections with a predominantly sexual mode of transmission: Secondary | ICD-10-CM

## 2021-01-30 DIAGNOSIS — Z01818 Encounter for other preprocedural examination: Secondary | ICD-10-CM | POA: Diagnosis not present

## 2021-01-30 NOTE — Progress Notes (Signed)
Obstetrics & Gynecology Surgery H&P    Chief Complaint: Scheduled Surgery   History of Present Illness: Patient is a 29 y.o. [id number] presenting for scheduled Mirena IUD removal and laparoscopic bilateral salpingectomy, for the treatment or further evaluation of Mirena IUD migration and undesired fertility.   Prior Treatments prior to proceeding with surgery include: Mirena IUD placement followed by AUB and ultrasound suggestive of migration into the lower uterine segment  Preoperative Pap: 01/05/21 Results:  NILM HPV negative   Preoperative Endometrial biopsy: N/A Preoperative Ultrasound: showing IUD in the lower uterine segment 12/21/20   Review of Systems:10 point review of systems  Past Medical History:  Patient Active Problem List   Diagnosis Date Noted   Labile blood pressure 12/04/2018    Pt states white coat , fluctuates when she comes for visits Frequently will have systolic BP in 140 range     BMI 50.0-59.9, adult (HCC) 08/29/2018    BMI >=40 [ ]  early 1h gtt -  [ ]  u/s for dating [ ]   [ ]  nutritional goals [ ]  folic acid 1mg  [ ]  bASA (>12 weeks) [ ]  consider nutrition consult [ ]  consider maternal EKG 1st trimester [ ]  Growth u/s 28 [ ] , 32 [ ] , 36 weeks [ ]  [ ]  NST/AFI weekly 36+ weeks (36[] , 37[] , 38[] , 39[] , 40[] ) [ ]  IOL by 41 weeks (scheduled, prn [] )    GAD (generalized anxiety disorder) 11/15/2017    Patient has difficulty getting over things impacts her daily life affects her GI disturbances and her HA s Offered Zoloft     Headache disorder 11/15/2017    Offered referral to neurology  Reports normal ophthalmology exam annually     Intractable chronic migraine without aura and without status migrainosus 09/04/2017   Irritable bowel syndrome with diarrhea 05/17/2017    Negative upper GI and colonoscopy in 2019      Past Surgical History:  Past Surgical History:  Procedure Laterality Date   CESAREAN SECTION     CESAREAN SECTION N/A 03/15/2019    Procedure: CESAREAN SECTION;  Surgeon: , MD;  Location: [facility];  Service: Obstetrics;  Laterality: N/A;   CESAREAN SECTION N/A 03/30/2020   Procedure: CESAREAN SECTION;  Surgeon: , MD;  Location: [facility];  Service: Obstetrics;  Laterality: N/A;   COLONOSCOPY WITH PROPOFOL N/A 07/24/2017   Procedure: COLONOSCOPY WITH PROPOFOL;  Surgeon: Toledo, , MD;  Location: [facility];  Service: Gastroenterology;  Laterality: N/A;   ESOPHAGOGASTRODUODENOSCOPY (EGD) WITH PROPOFOL N/A 07/24/2017   Procedure: ESOPHAGOGASTRODUODENOSCOPY (EGD) WITH PROPOFOL;  Surgeon: Toledo, , MD;  Location: [facility];  Service: Gastroenterology;  Laterality: N/A;   MIrena   02/2013    Family History:  Family History  Problem Relation Age of Onset   Hypertension Mother    Hypothyroidism Mother    Obesity Mother    Thyroid disease Mother    Hypertension Maternal Grandmother    Hypertension Maternal Grandfather    Hypertension Paternal Grandmother    Hypertension Paternal Grandfather    Vision loss Father    Breast cancer Neg Hx    Ovarian cancer Neg Hx     Social History:  Social History   Socioeconomic History   Marital status: Married    Spouse name: marcus   Number of children: 2   Years of education: Not on file   Highest education level: Not on file  Occupational History   Occupation:  Tobacco Use  Smoking status: Never   Smokeless tobacco: Never  Vaping Use   Vaping Use: Never used  Substance and Sexual Activity   Alcohol use: Not Currently   Drug use: No   Sexual activity: Yes    Birth control/protection: I.U.D.  Other Topics Concern   Not on file  Social History Narrative   ** Merged History Encounter **       Social Determinants of Health   Financial Resource Strain: Not on file  Food Insecurity: Not on file  Transportation Needs: Not on file  Physical Activity: Not on file  Stress: Not on file  Social  Connections: Not on file  Intimate Partner Violence: Not on file    Allergies:  Allergies  Allergen Reactions   Nickel Rash    Medications: Prior to Admission medications   Medication Sig Start Date End Date Taking? Authorizing Provider  acetaminophen (TYLENOL) 500 MG tablet Take 1,000 mg by mouth every 6 (six) hours as needed for moderate pain or headache.    [provider]  citalopram (CELEXA) 10 MG tablet Take 1 tablet by mouth daily. 12/14/20 12/14/21  [provider]  docusate sodium (COLACE) 100 MG capsule Take 1 capsule (100 mg total) by mouth 2 (two) times daily as needed. 04/01/20 04/01/21  Natale Milch, MD  hydrOXYzine (ATARAX/VISTARIL) 25 MG tablet Take 1 tablet (25 mg total) by mouth every 6 (six) hours as needed for anxiety. 06/02/20   [name], MD  methylphenidate 18 MG PO CR tablet Take by mouth. 12/14/20 01/13/21  [provider]  venlafaxine XR (EFFEXOR-XR) 75 MG 24 hr capsule TAKE 1 CAPSULE BY MOUTH EVERY DAY Patient not taking: Reported on 01/05/2021 07/03/20   [name], MD    Physical Exam Vitals: Blood pressure 126/72, weight (!) 337 lb (152.9 kg), not currently breastfeeding. General: NAD HEENT: normocephalic, anicteric Pulmonary: No increased work of breathing Cardiovascular: RRR, distal pulses 2+ Abdomen: soft, non-tender, non-distended Genitourinary: deferred Extremities: no edema, erythema, or tenderness Neurologic: Grossly intact Psychiatric: mood appropriate, affect full  Imaging No results found.  Assessment: 30 y.o. [id number] presenting for scheduled Mirena IUD removal and tubal ligation via laparoscopic bilateral salpingectomy  Plan: 1) 30 y.o. [id number]  with undesired fertility, desires permanent sterilization.  Other reversible forms of contraception were discussed with patient; she declines all other modalities. Permanent nature of as well as associated risks of the procedure discussed with patient  including but not limited to: risk of regret, permanence of method, bleeding, infection, injury to surrounding organs and need for additional procedures.  Failure risk of 0.5-1% with increased risk of ectopic gestation if pregnancy occurs was also discussed with patient.     2) Routine postoperative instructions were reviewed with the patient and her family in detail today including the expected length of recovery and likely postoperative course.  The patient concurred with the proposed plan, giving informed written consent for the surgery today.  Patient instructed on the importance of being NPO after midnight prior to her procedure.  If warranted preoperative prophylactic antibiotics and SCDs ordered on call to the OR to meet SCIP guidelines and adhere to recommendation laid forth in ACOG Practice Bulletin Number 104 May 2009  "Antibiotic Prophylaxis for Gynecologic Procedures".     [name], MD, [facility], [facility] 01/30/2021, 4:46 PM

## 2021-01-30 NOTE — H&P (View-Only) (Signed)
Obstetrics & Gynecology Surgery H&P    Chief Complaint: Scheduled Surgery   History of Present Illness: Patient is a 29 y.o. [id number] presenting for scheduled Mirena IUD removal and laparoscopic bilateral salpingectomy, for the treatment or further evaluation of Mirena IUD migration and undesired fertility.   Prior Treatments prior to proceeding with surgery include: Mirena IUD placement followed by AUB and ultrasound suggestive of migration into the lower uterine segment  Preoperative Pap: 01/05/21 Results:  NILM HPV negative   Preoperative Endometrial biopsy: N/A Preoperative Ultrasound: showing IUD in the lower uterine segment 12/21/20   Review of Systems:10 point review of systems  Past Medical History:  Patient Active Problem List   Diagnosis Date Noted   Labile blood pressure 12/04/2018    Pt states white coat , fluctuates when she comes for visits Frequently will have systolic BP in 140 range     BMI 50.0-59.9, adult (HCC) 08/29/2018    BMI >=40 [ ]  early 1h gtt -  [ ]  u/s for dating [ ]   [ ]  nutritional goals [ ]  folic acid 1mg  [ ]  bASA (>12 weeks) [ ]  consider nutrition consult [ ]  consider maternal EKG 1st trimester [ ]  Growth u/s 28 [ ] , 32 [ ] , 36 weeks [ ]  [ ]  NST/AFI weekly 36+ weeks (36[] , 37[] , 38[] , 39[] , 40[] ) [ ]  IOL by 41 weeks (scheduled, prn [] )    GAD (generalized anxiety disorder) 11/15/2017    Patient has difficulty getting over things impacts her daily life affects her GI disturbances and her HA s Offered Zoloft     Headache disorder 11/15/2017    Offered referral to neurology  Reports normal ophthalmology exam annually     Intractable chronic migraine without aura and without status migrainosus 09/04/2017   Irritable bowel syndrome with diarrhea 05/17/2017    Negative upper GI and colonoscopy in 2019      Past Surgical History:  Past Surgical History:  Procedure Laterality Date   CESAREAN SECTION     CESAREAN SECTION N/A 03/15/2019    Procedure: CESAREAN SECTION;  Surgeon: , MD;  Location: [facility];  Service: Obstetrics;  Laterality: N/A;   CESAREAN SECTION N/A 03/30/2020   Procedure: CESAREAN SECTION;  Surgeon: , MD;  Location: [facility];  Service: Obstetrics;  Laterality: N/A;   COLONOSCOPY WITH PROPOFOL N/A 07/24/2017   Procedure: COLONOSCOPY WITH PROPOFOL;  Surgeon: Toledo, , MD;  Location: [facility];  Service: Gastroenterology;  Laterality: N/A;   ESOPHAGOGASTRODUODENOSCOPY (EGD) WITH PROPOFOL N/A 07/24/2017   Procedure: ESOPHAGOGASTRODUODENOSCOPY (EGD) WITH PROPOFOL;  Surgeon: Toledo, , MD;  Location: [facility];  Service: Gastroenterology;  Laterality: N/A;   MIrena   02/2013    Family History:  Family History  Problem Relation Age of Onset   Hypertension Mother    Hypothyroidism Mother    Obesity Mother    Thyroid disease Mother    Hypertension Maternal Grandmother    Hypertension Maternal Grandfather    Hypertension Paternal Grandmother    Hypertension Paternal Grandfather    Vision loss Father    Breast cancer Neg Hx    Ovarian cancer Neg Hx     Social History:  Social History   Socioeconomic History   Marital status: Married    Spouse name: marcus   Number of children: 2   Years of education: Not on file   Highest education level: Not on file  Occupational History   Occupation:  Tobacco Use  Smoking status: Never   Smokeless tobacco: Never  Vaping Use   Vaping Use: Never used  Substance and Sexual Activity   Alcohol use: Not Currently   Drug use: No   Sexual activity: Yes    Birth control/protection: I.U.D.  Other Topics Concern   Not on file  Social History Narrative   ** Merged History Encounter **       Social Determinants of Health   Financial Resource Strain: Not on file  Food Insecurity: Not on file  Transportation Needs: Not on file  Physical Activity: Not on file  Stress: Not on file  Social  Connections: Not on file  Intimate Partner Violence: Not on file    Allergies:  Allergies  Allergen Reactions   Nickel Rash    Medications: Prior to Admission medications   Medication Sig Start Date End Date Taking? Authorizing Provider  acetaminophen (TYLENOL) 500 MG tablet Take 1,000 mg by mouth every 6 (six) hours as needed for moderate pain or headache.    [provider]  citalopram (CELEXA) 10 MG tablet Take 1 tablet by mouth daily. 12/14/20 12/14/21  [provider]  docusate sodium (COLACE) 100 MG capsule Take 1 capsule (100 mg total) by mouth 2 (two) times daily as needed. 04/01/20 04/01/21  Natale Milch, MD  hydrOXYzine (ATARAX/VISTARIL) 25 MG tablet Take 1 tablet (25 mg total) by mouth every 6 (six) hours as needed for anxiety. 06/02/20   [name], MD  methylphenidate 18 MG PO CR tablet Take by mouth. 12/14/20 01/13/21  [provider]  venlafaxine XR (EFFEXOR-XR) 75 MG 24 hr capsule TAKE 1 CAPSULE BY MOUTH EVERY DAY Patient not taking: Reported on 01/05/2021 07/03/20   [name], MD    Physical Exam Vitals: Blood pressure 126/72, weight (!) 337 lb (152.9 kg), not currently breastfeeding. General: NAD HEENT: normocephalic, anicteric Pulmonary: No increased work of breathing Cardiovascular: RRR, distal pulses 2+ Abdomen: soft, non-tender, non-distended Genitourinary: deferred Extremities: no edema, erythema, or tenderness Neurologic: Grossly intact Psychiatric: mood appropriate, affect full  Imaging No results found.  Assessment: 30 y.o. [id number] presenting for scheduled Mirena IUD removal and tubal ligation via laparoscopic bilateral salpingectomy  Plan: 1) 30 y.o. [id number]  with undesired fertility, desires permanent sterilization.  Other reversible forms of contraception were discussed with patient; she declines all other modalities. Permanent nature of as well as associated risks of the procedure discussed with patient  including but not limited to: risk of regret, permanence of method, bleeding, infection, injury to surrounding organs and need for additional procedures.  Failure risk of 0.5-1% with increased risk of ectopic gestation if pregnancy occurs was also discussed with patient.     2) Routine postoperative instructions were reviewed with the patient and her family in detail today including the expected length of recovery and likely postoperative course.  The patient concurred with the proposed plan, giving informed written consent for the surgery today.  Patient instructed on the importance of being NPO after midnight prior to her procedure.  If warranted preoperative prophylactic antibiotics and SCDs ordered on call to the OR to meet SCIP guidelines and adhere to recommendation laid forth in ACOG Practice Bulletin Number 104 May 2009  "Antibiotic Prophylaxis for Gynecologic Procedures".     [name], MD, [facility], [facility] 01/30/2021, 4:46 PM

## 2021-02-02 LAB — CERVICOVAGINAL ANCILLARY ONLY
Chlamydia: NEGATIVE
Comment: NEGATIVE
Comment: NORMAL
Neisseria Gonorrhea: NEGATIVE

## 2021-02-06 ENCOUNTER — Other Ambulatory Visit: Payer: Self-pay | Admitting: Obstetrics and Gynecology

## 2021-02-06 MED ORDER — TERCONAZOLE 0.4 % VA CREA: 1.0000 | TOPICAL_CREAM | Freq: Every day | VAGINAL | 0 refills | Status: DC

## 2021-02-07 ENCOUNTER — Encounter: Payer: BC Managed Care – PPO | Admitting: Obstetrics and Gynecology

## 2021-02-08 ENCOUNTER — Other Ambulatory Visit: Payer: Self-pay

## 2021-02-08 ENCOUNTER — Encounter
Admission: RE | Admit: 2021-02-08 | Discharge: 2021-02-08 | Disposition: A | Discharge status: Home / Self Care | Payer: BC Managed Care – PPO | Source: Ambulatory Visit | Attending: Obstetrics and Gynecology | Admitting: Obstetrics and Gynecology

## 2021-02-08 NOTE — Patient Instructions (Signed)
Your procedure is scheduled on:02-14-21 Tuesday Report to the Registration Desk on the 1st floor of the Medical Mall.Then proceed to the 2nd floor Surgery Desk in the Medical Mall To find out your arrival time, please call [phone] between 1PM - 3PM on:02-13-21 Monday  REMEMBER: Instructions that are not followed completely may result in serious medical risk, up to and including death; or upon the discretion of your surgeon and anesthesiologist your surgery may need to be rescheduled.  Do not eat food after midnight the night before surgery.  No gum chewing, lozengers or hard candies.  You may however, drink CLEAR liquids up to 2 hours before you are scheduled to arrive for your surgery. Do not drink anything within 2 hours of your scheduled arrival time.  Clear liquids include: - water  - apple juice without pulp - gatorade (not RED, PURPLE, OR BLUE) - black coffee or tea (Do NOT add milk or creamers to the coffee or tea) Do NOT drink anything that is not on this list.  Do not take any medication the morning of surgery  One week prior to surgery: Stop Anti-inflammatories (NSAIDS) such as Advil, Aleve, Ibuprofen, Motrin, Naproxen, Naprosyn and Aspirin based products such as Excedrin, Goodys Powder, BC Powder.You may however, continue to take Tylenol if needed for pain up until the day of surgery.  Stop ANY OVER THE COUNTER supplements/vitamins NOW (02-08-21) until after surgery(Multivitamin)  No Alcohol for 24 hours before or after surgery.  No Smoking including e-cigarettes for 24 hours prior to surgery.  No chewable tobacco products for at least 6 hours prior to surgery.  No nicotine patches on the day of surgery.  Do not use any "recreational" drugs for at least a week prior to your surgery.  Please be advised that the combination of cocaine and anesthesia may have negative outcomes, up to and including death. If you test positive for cocaine, your surgery will be  cancelled.  On the morning of surgery brush your teeth with toothpaste and water, you may rinse your mouth with mouthwash if you wish. Do not swallow any toothpaste or mouthwash.  Use CHG Soap as directed on instruction sheet.  Do not wear jewelry, make-up, hairpins, clips or nail polish.  Do not wear lotions, powders, or perfumes.   Do not shave body from the neck down 48 hours prior to surgery just in case you cut yourself which could leave a site for infection.  Also, freshly shaved skin may become irritated if using the CHG soap.  Contact lenses, hearing aids and dentures may not be worn into surgery.  Do not bring valuables to the hospital. [facility] is not responsible for any missing/lost belongings or valuables.   Notify your doctor if there is any change in your medical condition (cold, fever, infection).  Wear comfortable clothing (specific to your surgery type) to the hospital.  After surgery, you can help prevent lung complications by doing breathing exercises.  Take deep breaths and cough every 1-2 hours. Your doctor may order a device called an Incentive Spirometer to help you take deep breaths. When coughing or sneezing, hold a pillow firmly against your incision with both hands. This is called "splinting." Doing this helps protect your incision. It also decreases belly discomfort.  If you are being admitted to the hospital overnight, leave your suitcase in the car. After surgery it may be brought to your room.  If you are being discharged the day of surgery, you will not be  allowed to drive home. You will need a responsible adult (18 years or older) to drive you home and stay with you that night.   If you are taking public transportation, you will need to have a responsible adult (18 years or older) with you. Please confirm with your physician that it is acceptable to use public transportation.   Please call the Pre-admissions Testing Dept. at [phone] if you  have any questions about these instructions.  Surgery Visitation Policy:  Patients undergoing a surgery or procedure may have one family member or support person with them as long as that person is not COVID-19 positive or experiencing its symptoms.  That person may remain in the waiting area during the procedure and may rotate out with other people.  Inpatient Visitation:    Visiting hours are 7 a.m. to 8 p.m. Up to two visitors ages 16+ are allowed at one time in a patient room. The visitors may rotate out with other people during the day. Visitors must check out when they leave, or other visitors will not be allowed. One designated support person may remain overnight. The visitor must pass COVID-19 screenings, use hand sanitizer when entering and exiting the patient's room and wear a mask at all times, including in the patient's room. Patients must also wear a mask when staff or their visitor are in the room. Masking is required regardless of vaccination status.

## 2021-02-14 ENCOUNTER — Ambulatory Visit
Admission: RE | Admit: 2021-02-14 | Discharge: 2021-02-14 | Disposition: A | Discharge status: Home / Self Care | Payer: BC Managed Care – PPO | Attending: Obstetrics and Gynecology | Admitting: Obstetrics and Gynecology

## 2021-02-14 ENCOUNTER — Other Ambulatory Visit: Payer: Self-pay

## 2021-02-14 ENCOUNTER — Ambulatory Visit: Payer: BC Managed Care – PPO | Admitting: Urgent Care

## 2021-02-14 ENCOUNTER — Encounter
Admission: RE | Disposition: A | Discharge status: Home / Self Care | Payer: Self-pay | Source: Home / Self Care | Attending: Obstetrics and Gynecology

## 2021-02-14 ENCOUNTER — Encounter: Payer: Self-pay | Admitting: Obstetrics and Gynecology

## 2021-02-14 DIAGNOSIS — T8332XA Displacement of intrauterine contraceptive device, initial encounter: Secondary | ICD-10-CM | POA: Diagnosis not present

## 2021-02-14 DIAGNOSIS — Z6841 Body Mass Index (BMI) 40.0 and over, adult: Secondary | ICD-10-CM | POA: Insufficient documentation

## 2021-02-14 DIAGNOSIS — Y848 Other medical procedures as the cause of abnormal reaction of the patient, or of later complication, without mention of misadventure at the time of the procedure: Secondary | ICD-10-CM | POA: Insufficient documentation

## 2021-02-14 DIAGNOSIS — Z9889 Other specified postprocedural states: Secondary | ICD-10-CM

## 2021-02-14 DIAGNOSIS — Z79899 Other long term (current) drug therapy: Secondary | ICD-10-CM | POA: Insufficient documentation

## 2021-02-14 DIAGNOSIS — N736 Female pelvic peritoneal adhesions (postinfective): Secondary | ICD-10-CM | POA: Diagnosis not present

## 2021-02-14 DIAGNOSIS — Z30432 Encounter for removal of intrauterine contraceptive device: Secondary | ICD-10-CM | POA: Diagnosis not present

## 2021-02-14 DIAGNOSIS — Z302 Encounter for sterilization: Secondary | ICD-10-CM | POA: Diagnosis not present

## 2021-02-14 DIAGNOSIS — N838 Other noninflammatory disorders of ovary, fallopian tube and broad ligament: Secondary | ICD-10-CM | POA: Diagnosis not present

## 2021-02-14 DIAGNOSIS — Z3009 Encounter for other general counseling and advice on contraception: Secondary | ICD-10-CM

## 2021-02-14 HISTORY — PX: LAPAROSCOPIC BILATERAL SALPINGECTOMY: SHX5889

## 2021-02-14 HISTORY — PX: IUD REMOVAL: SHX5392

## 2021-02-14 LAB — POCT PREGNANCY, URINE: Preg Test, Ur: NEGATIVE

## 2021-02-14 SURGERY — SALPINGECTOMY, BILATERAL, LAPAROSCOPIC
Anesthesia: General

## 2021-02-14 MED ORDER — OXYCODONE HCL 5 MG PO TABS
5.0000 mg | ORAL_TABLET | Freq: Once | ORAL | Status: AC | PRN
Start: 2021-02-14 — End: 2021-02-14

## 2021-02-14 MED ORDER — ROCURONIUM BROMIDE 10 MG/ML (PF) SYRINGE
PREFILLED_SYRINGE | INTRAVENOUS | Status: AC
  Filled 2021-02-14: qty 10

## 2021-02-14 MED ORDER — LACTATED RINGERS IV SOLN: INTRAVENOUS | Status: DC

## 2021-02-14 MED ORDER — CEFAZOLIN SODIUM-DEXTROSE 2-4 GM/100ML-% IV SOLN
2.0000 g | INTRAVENOUS | Status: AC
  Administered 2021-02-14: 3 g via INTRAVENOUS

## 2021-02-14 MED ORDER — ACETAMINOPHEN 10 MG/ML IV SOLN
INTRAVENOUS | Status: DC | PRN
  Administered 2021-02-14: 1000 mg via INTRAVENOUS

## 2021-02-14 MED ORDER — IBUPROFEN 600 MG PO TABS: 600.0000 mg | ORAL_TABLET | Freq: Four times a day (QID) | ORAL | 0 refills | Status: AC | PRN

## 2021-02-14 MED ORDER — CEFAZOLIN SODIUM-DEXTROSE 2-4 GM/100ML-% IV SOLN
INTRAVENOUS | Status: AC
  Filled 2021-02-14: qty 100

## 2021-02-14 MED ORDER — SUGAMMADEX SODIUM 500 MG/5ML IV SOLN
INTRAVENOUS | Status: DC | PRN
  Administered 2021-02-14: 400 mg via INTRAVENOUS

## 2021-02-14 MED ORDER — DEXMEDETOMIDINE HCL IN NACL 200 MCG/50ML IV SOLN
INTRAVENOUS | Status: DC | PRN
  Administered 2021-02-14 (×2): 10 ug via INTRAVENOUS

## 2021-02-14 MED ORDER — KETOROLAC TROMETHAMINE 30 MG/ML IJ SOLN
INTRAMUSCULAR | Status: AC
  Filled 2021-02-14: qty 1

## 2021-02-14 MED ORDER — CEFAZOLIN SODIUM 1 G IJ SOLR
INTRAMUSCULAR | Status: AC
  Filled 2021-02-14: qty 10

## 2021-02-14 MED ORDER — ORAL CARE MOUTH RINSE: 15.0000 mL | Freq: Once | OROMUCOSAL | Status: AC

## 2021-02-14 MED ORDER — CHLORHEXIDINE GLUCONATE 0.12 % MT SOLN: 15.0000 mL | Freq: Once | OROMUCOSAL | Status: AC

## 2021-02-14 MED ORDER — ONDANSETRON HCL 4 MG/2ML IJ SOLN
INTRAMUSCULAR | Status: AC
  Filled 2021-02-14: qty 2

## 2021-02-14 MED ORDER — SUGAMMADEX SODIUM 500 MG/5ML IV SOLN
INTRAVENOUS | Status: AC
  Filled 2021-02-14: qty 5

## 2021-02-14 MED ORDER — BUPIVACAINE HCL (PF) 0.5 % IJ SOLN
INTRAMUSCULAR | Status: AC
  Filled 2021-02-14: qty 30

## 2021-02-14 MED ORDER — PROPOFOL 10 MG/ML IV BOLUS
INTRAVENOUS | Status: AC
  Filled 2021-02-14: qty 20

## 2021-02-14 MED ORDER — FENTANYL CITRATE (PF) 100 MCG/2ML IJ SOLN
INTRAMUSCULAR | Status: AC
  Filled 2021-02-14: qty 2

## 2021-02-14 MED ORDER — OXYCODONE HCL 5 MG PO TABS
ORAL_TABLET | ORAL | Status: AC
  Administered 2021-02-14: 5 mg via ORAL
  Filled 2021-02-14: qty 1

## 2021-02-14 MED ORDER — FAMOTIDINE 20 MG PO TABS
ORAL_TABLET | ORAL | Status: AC
  Administered 2021-02-14: 20 mg via ORAL
  Filled 2021-02-14: qty 1

## 2021-02-14 MED ORDER — FENTANYL CITRATE (PF) 100 MCG/2ML IJ SOLN: 25.0000 ug | INTRAMUSCULAR | Status: DC | PRN

## 2021-02-14 MED ORDER — FENTANYL CITRATE (PF) 100 MCG/2ML IJ SOLN
INTRAMUSCULAR | Status: DC | PRN
  Administered 2021-02-14: 50 ug via INTRAVENOUS

## 2021-02-14 MED ORDER — ACETAMINOPHEN 10 MG/ML IV SOLN
INTRAVENOUS | Status: AC
  Filled 2021-02-14: qty 100

## 2021-02-14 MED ORDER — APREPITANT 40 MG PO CAPS: 40.0000 mg | ORAL_CAPSULE | Freq: Once | ORAL | Status: DC

## 2021-02-14 MED ORDER — MIDAZOLAM HCL 2 MG/2ML IJ SOLN
INTRAMUSCULAR | Status: AC
  Filled 2021-02-14: qty 2

## 2021-02-14 MED ORDER — FAMOTIDINE 20 MG PO TABS: 20.0000 mg | ORAL_TABLET | Freq: Once | ORAL | Status: AC

## 2021-02-14 MED ORDER — MEPERIDINE HCL 25 MG/ML IJ SOLN: 6.2500 mg | INTRAMUSCULAR | Status: DC | PRN

## 2021-02-14 MED ORDER — HEMOSTATIC AGENTS (NO CHARGE) OPTIME
TOPICAL | Status: DC | PRN
  Administered 2021-02-14: 1 via TOPICAL

## 2021-02-14 MED ORDER — LIDOCAINE HCL (PF) 2 % IJ SOLN
INTRAMUSCULAR | Status: AC
  Filled 2021-02-14: qty 5

## 2021-02-14 MED ORDER — ONDANSETRON HCL 4 MG/2ML IJ SOLN
INTRAMUSCULAR | Status: DC | PRN
  Administered 2021-02-14: 4 mg via INTRAVENOUS

## 2021-02-14 MED ORDER — PROMETHAZINE HCL 25 MG/ML IJ SOLN
INTRAMUSCULAR | Status: AC
  Administered 2021-02-14: 6.25 mg via INTRAVENOUS
  Filled 2021-02-14: qty 1

## 2021-02-14 MED ORDER — DEXAMETHASONE SODIUM PHOSPHATE 10 MG/ML IJ SOLN
INTRAMUSCULAR | Status: DC | PRN
  Administered 2021-02-14: 10 mg via INTRAVENOUS

## 2021-02-14 MED ORDER — OXYCODONE HCL 5 MG/5ML PO SOLN
5.0000 mg | Freq: Once | ORAL | Status: AC | PRN
Start: 2021-02-14 — End: 2021-02-14

## 2021-02-14 MED ORDER — CHLORHEXIDINE GLUCONATE 0.12 % MT SOLN
OROMUCOSAL | Status: AC
  Administered 2021-02-14: 15 mL via OROMUCOSAL
  Filled 2021-02-14: qty 15

## 2021-02-14 MED ORDER — POVIDONE-IODINE 10 % EX SWAB: 2.0000 "application " | Freq: Once | CUTANEOUS | Status: DC

## 2021-02-14 MED ORDER — DEXAMETHASONE SODIUM PHOSPHATE 10 MG/ML IJ SOLN
INTRAMUSCULAR | Status: AC
  Filled 2021-02-14: qty 1

## 2021-02-14 MED ORDER — OXYCODONE-ACETAMINOPHEN 5-325 MG PO TABS: 1.0000 | ORAL_TABLET | ORAL | 0 refills | Status: AC | PRN

## 2021-02-14 MED ORDER — MIDAZOLAM HCL 2 MG/2ML IJ SOLN
INTRAMUSCULAR | Status: DC | PRN
  Administered 2021-02-14 (×2): 1 mg via INTRAVENOUS

## 2021-02-14 MED ORDER — PROMETHAZINE HCL 25 MG/ML IJ SOLN: 6.2500 mg | INTRAMUSCULAR | Status: DC | PRN

## 2021-02-14 MED ORDER — ROCURONIUM BROMIDE 100 MG/10ML IV SOLN
INTRAVENOUS | Status: DC | PRN
  Administered 2021-02-14: 70 mg via INTRAVENOUS
  Administered 2021-02-14 (×2): 10 mg via INTRAVENOUS

## 2021-02-14 MED ORDER — BUPIVACAINE HCL 0.5 % IJ SOLN
INTRAMUSCULAR | Status: DC | PRN
  Administered 2021-02-14: 16 mL

## 2021-02-14 MED ORDER — LIDOCAINE HCL (CARDIAC) PF 100 MG/5ML IV SOSY
PREFILLED_SYRINGE | INTRAVENOUS | Status: DC | PRN
  Administered 2021-02-14: 100 mg via INTRAVENOUS

## 2021-02-14 MED ORDER — PROPOFOL 10 MG/ML IV BOLUS
INTRAVENOUS | Status: DC | PRN
  Administered 2021-02-14: 50 mg via INTRAVENOUS
  Administered 2021-02-14: 200 mg via INTRAVENOUS

## 2021-02-14 SURGICAL SUPPLY — 57 items
ADH SKN CLS APL DERMABOND .7 (GAUZE/BANDAGES/DRESSINGS) ×2
ANCHOR TIS RET SYS 235ML (MISCELLANEOUS) IMPLANT
APL PRP STRL LF DISP 70% ISPRP (MISCELLANEOUS) ×2
APL SRG 38 LTWT LNG FL B (MISCELLANEOUS) ×2
APPLICATOR ARISTA FLEXITIP XL (MISCELLANEOUS) ×3 IMPLANT
BAG DRN RND TRDRP ANRFLXCHMBR (UROLOGICAL SUPPLIES) ×2
BAG TISS RTRVL C235 10X14 (MISCELLANEOUS)
BAG URINE DRAIN 2000ML AR STRL (UROLOGICAL SUPPLIES) ×3 IMPLANT
BLADE SURG SZ11 CARB STEEL (BLADE) ×3 IMPLANT
CATH FOLEY 2WAY  5CC 16FR (CATHETERS)
CATH FOLEY 2WAY 5CC 16FR (CATHETERS)
CATH ROBINSON RED A/P 16FR (CATHETERS) ×3 IMPLANT
CATH URTH 16FR FL 2W BLN LF (CATHETERS) IMPLANT
CHLORAPREP W/TINT 26 (MISCELLANEOUS) ×3 IMPLANT
DERMABOND ADVANCED (GAUZE/BANDAGES/DRESSINGS) ×1
DERMABOND ADVANCED .7 DNX12 (GAUZE/BANDAGES/DRESSINGS) ×2 IMPLANT
GAUZE 4X4 16PLY ~~LOC~~+RFID DBL (SPONGE) ×3 IMPLANT
GAUZE PETROLATUM 1 X8 (GAUZE/BANDAGES/DRESSINGS) ×6 IMPLANT
GLOVE SURG ENC MOIS LTX SZ7 (GLOVE) ×3 IMPLANT
GLOVE SURG UNDER LTX SZ7.5 (GLOVE) ×3 IMPLANT
GOWN STRL REUS W/ TWL LRG LVL3 (GOWN DISPOSABLE) ×4 IMPLANT
GOWN STRL REUS W/ TWL XL LVL3 (GOWN DISPOSABLE) IMPLANT
GOWN STRL REUS W/TWL LRG LVL3 (GOWN DISPOSABLE) ×6
GOWN STRL REUS W/TWL XL LVL3 (GOWN DISPOSABLE)
GRASPER SUT TROCAR 14GX15 (MISCELLANEOUS) IMPLANT
HEMOSTAT ARISTA ABSORB 1G (HEMOSTASIS) ×3 IMPLANT
IRRIGATION STRYKERFLOW (MISCELLANEOUS) IMPLANT
IRRIGATOR STRYKERFLOW (MISCELLANEOUS)
IV LACTATED RINGERS 1000ML (IV SOLUTION) ×3 IMPLANT
KIT PINK PAD W/HEAD ARE REST (MISCELLANEOUS)
KIT PINK PAD W/HEAD ARM REST (MISCELLANEOUS) IMPLANT
KIT TURNOVER CYSTO (KITS) ×3 IMPLANT
LABEL OR SOLS (LABEL) ×3 IMPLANT
MANIFOLD NEPTUNE II (INSTRUMENTS) ×3 IMPLANT
NS IRRIG 500ML POUR BTL (IV SOLUTION) ×3 IMPLANT
PACK DNC HYST (MISCELLANEOUS) ×3 IMPLANT
PACK GYN LAPAROSCOPIC (MISCELLANEOUS) ×3 IMPLANT
PAD OB MATERNITY 4.3X12.25 (PERSONAL CARE ITEMS) ×3 IMPLANT
PAD PREP 24X41 OB/GYN DISP (PERSONAL CARE ITEMS) ×3 IMPLANT
PENCIL ELECTRO HAND CTR (MISCELLANEOUS) ×3 IMPLANT
SCISSORS METZENBAUM CVD 33 (INSTRUMENTS) IMPLANT
SCRUB EXIDINE 4% CHG 4OZ (MISCELLANEOUS) ×3 IMPLANT
SET TUBE SMOKE EVAC HIGH FLOW (TUBING) ×3 IMPLANT
SHEARS HARMONIC ACE PLUS 36CM (ENDOMECHANICALS) IMPLANT
SLEEVE ENDOPATH XCEL 5M (ENDOMECHANICALS) ×6 IMPLANT
SPONGE T-LAP 18X18 ~~LOC~~+RFID (SPONGE) ×3 IMPLANT
SUT MNCRL 4-0 (SUTURE) ×3
SUT MNCRL 4-0 27XMFL (SUTURE) ×2
SUT MNCRL AB 4-0 PS2 18 (SUTURE) ×3 IMPLANT
SUT VIC AB 2-0 UR6 27 (SUTURE) ×6 IMPLANT
SUT VICRYL 0 AB UR-6 (SUTURE) ×3 IMPLANT
SUTURE MNCRL 4-0 27XMF (SUTURE) ×2 IMPLANT
TOWEL OR 17X26 4PK STRL BLUE (TOWEL DISPOSABLE) ×3 IMPLANT
TROCAR ENDO BLADELESS 11MM (ENDOMECHANICALS) IMPLANT
TROCAR XCEL BLUNT TIP 100MML (ENDOMECHANICALS) ×3 IMPLANT
TROCAR XCEL NON-BLD 5MMX100MML (ENDOMECHANICALS) ×3 IMPLANT
WATER STERILE IRR 500ML POUR (IV SOLUTION) ×3 IMPLANT

## 2021-02-14 NOTE — Progress Notes (Signed)
Patient c/o's of right upper lip discomfort.  No bleeding, no open area. Appears like she may have bite. Anesthesia made aware.

## 2021-02-14 NOTE — Anesthesia Procedure Notes (Signed)
Procedure Name: Intubation Date/Time: 02/14/2021 12:05 PM Performed by: Rodney Booze, CRNA Pre-anesthesia Checklist: Patient identified, Emergency Drugs available, Suction available and Patient being monitored Patient Re-evaluated:Patient Re-evaluated prior to induction Oxygen Delivery Method: Circle system utilized Preoxygenation: Pre-oxygenation with 100% oxygen Induction Type: IV induction Ventilation: Mask ventilation without difficulty Laryngoscope Size: Miller and 2 Grade View: Grade I Tube type: Oral Tube size: 7.0 mm Number of attempts: 1 Airway Equipment and Method: Stylet and Oral airway (multiple blankets to optimize sniffing position) Placement Confirmation: ETT inserted through vocal cords under direct vision, positive ETCO2 and breath sounds checked- equal and bilateral Secured at: 20 cm Tube secured with: Tape Dental Injury: Teeth and Oropharynx as per pre-operative assessment

## 2021-02-14 NOTE — Progress Notes (Signed)
Patient awake/alert but is very sleepy. Easily awakens, states pain score 7/10, LTA, no cough, sats at baseline for patient. Will continue to monitor. To be discharged to home.

## 2021-02-14 NOTE — Anesthesia Postprocedure Evaluation (Signed)
Anesthesia Post Note  Patient: Laverna Dossett  Procedure(s) Performed: LAPAROSCOPIC BILATERAL PARTIAL SALPINGECTOMY (Bilateral) INTRAUTERINE DEVICE (IUD) REMOVAL  Patient location during evaluation: PACU Anesthesia Type: General Level of consciousness: awake and alert, awake and oriented Pain management: pain level controlled Vital Signs Assessment: post-procedure vital signs reviewed and stable Respiratory status: spontaneous breathing, nonlabored ventilation and respiratory function stable Cardiovascular status: blood pressure returned to baseline and stable Postop Assessment: no apparent nausea or vomiting Anesthetic complications: no   No notable events documented.   Last Vitals:  Vitals:   02/14/21 1415 02/14/21 1430  BP: 117/68 114/67  Pulse: 78 76  Resp: 14 10  Temp: (!) 36.4 C   SpO2: 95% 96%    Last Pain:  Vitals:   02/14/21 1430  TempSrc:   PainSc: 7                  Manfred Arch

## 2021-02-14 NOTE — Anesthesia Preprocedure Evaluation (Signed)
Anesthesia Evaluation  Patient identified by MRN, date of birth, ID band Patient awake    Reviewed: Allergy & Precautions, NPO status , Patient's Chart, lab work & pertinent test results  History of Anesthesia Complications (+) history of anesthetic complications (had nausea after csection)  Airway Mallampati: III  TM Distance: >3 FB Neck ROM: Full    Dental no notable dental hx.    Pulmonary neg pulmonary ROS, neg sleep apnea, neg COPD,    breath sounds clear to auscultation- rhonchi (-) wheezing      Cardiovascular Exercise Tolerance: Good (-) hypertension(-) CAD, (-) Past MI, (-) Cardiac Stents and (-) CABG  Rhythm:Regular Rate:Normal - Systolic murmurs and - Diastolic murmurs    Neuro/Psych  Headaches, neg Seizures PSYCHIATRIC DISORDERS Anxiety Depression    GI/Hepatic negative GI ROS, Neg liver ROS,   Endo/Other  negative endocrine ROSneg diabetes  Renal/GU negative Renal ROS     Musculoskeletal negative musculoskeletal ROS (+)   Abdominal (+) + obese,   Peds  Hematology negative hematology ROS (+)   Anesthesia Other Findings Past Medical History: No date: Anxiety No date: BMI 50.0-59.9, adult (HCC) No date: Complication of anesthesia     Comment:  has NV right before both csections No date: Depression No date: Family history of adverse reaction to anesthesia     Comment:  mother but not sure what happened. No date: Headache No date: IBS (irritable bowel syndrome) No date: PONV (postoperative nausea and vomiting)     Comment:  had after 1 of her c sections   Reproductive/Obstetrics                             Anesthesia Physical Anesthesia Plan  ASA: 2  Anesthesia Plan: General   Post-op Pain Management:    Induction: Intravenous  PONV Risk Score and Plan: 2 and Ondansetron, Dexamethasone and Midazolam  Airway Management Planned: Oral ETT  Additional Equipment:    Intra-op Plan:   Post-operative Plan: Extubation in OR  Informed Consent: I have reviewed the patients History and Physical, chart, labs and discussed the procedure including the risks, benefits and alternatives for the proposed anesthesia with the patient or authorized representative who has indicated his/her understanding and acceptance.     Dental advisory given  Plan Discussed with: CRNA and Anesthesiologist  Anesthesia Plan Comments:         Anesthesia Quick Evaluation

## 2021-02-14 NOTE — Discharge Instructions (Signed)
AMBULATORY SURGERY  ?DISCHARGE INSTRUCTIONS ? ? ?The drugs that you were given will stay in your system until tomorrow so for the next 24 hours you should not: ? ?Drive an automobile ?Make any legal decisions ?Drink any alcoholic beverage ? ? ?You may resume regular meals tomorrow.  Today it is better to start with liquids and gradually work up to solid foods. ? ?You may eat anything you prefer, but it is better to start with liquids, then soup and crackers, and gradually work up to solid foods. ? ? ?Please notify your doctor immediately if you have any unusual bleeding, trouble breathing, redness and pain at the surgery site, drainage, fever, or pain not relieved by medication. ? ? ? ?Additional Instructions: ? ? ? ?Please contact your physician with any problems or Same Day Surgery at [phone], Monday through Friday 6 am to 4 pm, or [place] [facility] [place] Main number at [phone].  ?

## 2021-02-14 NOTE — Interval H&P Note (Signed)
History and Physical Interval Note:  02/14/2021 11:21 AM  Debra Mcdaniel  has presented today for surgery, with the diagnosis of unwanted fertility IUD migration.  The various methods of treatment have been discussed with the patient and family. After consideration of risks, benefits and other options for treatment, the patient has consented to  Procedure(s): LAPAROSCOPIC BILATERAL SALPINGECTOMY (Bilateral) INTRAUTERINE DEVICE (IUD) REMOVAL (N/A) as a surgical intervention.  The patient's history has been reviewed, patient examined, no change in status, stable for surgery.  I have reviewed the patient's chart and labs.  Questions were answered to the patient's satisfaction.     [name]

## 2021-02-14 NOTE — Op Note (Signed)
Preoperative Diagnosis: 1) 30 y.o.  with undesired fertility 2) Malpositioned Mirena IUD 3) Morbid obesity Body mass index is 61.64 kg/m.   Postoperative Diagnosis: 1) 30 y.o. with undesired fertility 2) Malpositioned Mirena IUD 3) Morbid obesity Body mass index is 61.64 kg/m. 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 [id number]  with undesired fertility, desires permanent sterilization.  Other reversible forms of contraception were discussed with patient; she declines all other modalities. Permanent nature of as well as associated risks of the procedure discussed with patient including but not limited to: risk of regret, permanence of method, bleeding, infection, injury to surrounding organs and need for additional procedures.  F  Surgeon: [name], MD  Anesthesia: General  Preoperative Antibiotics: 3g Ancef  Estimated Blood Loss: 10 mL  Urine Output:: 34mL  Drains or Tubes: none  Implants: none  Specimens Removed: Bilateral fallopian tubes, and Mirena IUD  Complications: none  Intraoperative Findings: Normal tubes, and ovaries.  Uterus fused midline to anterior abdominal wall with several adhesions of omentum to the anterior abdominal wall Patient Condition: stable  Procedure in Detail:  Patient was taken to the operating room where she was administered general anesthesia.  She was positioned in the dorsal lithotomy position utilizing Allen stirups, prepped and draped in the usual sterile fashion.  Prior to proceeding with procedure a time out was performed.  Attention was turned to the patient's pelvis.  A red rubber catheter was used to empty the patient's bladder.  The IUD strings were palpated and the IUD was removed in tact using ring forceps.  Given patient postioning on the table and habitus a uterine manipulator was not placed.  Attention was turned  to the patient's abdomen.  The umbilicus was infiltrated with 1% Sensorcaine, before making a stab incision using an 11 blade scalpel.  A 34mm Excel trocar was then used to gain direct entry into the peritoneal cavity utilizing the camera to visualize progress of the trocar during placement.  The trocar was unable to be advanced into the peritoneal cavity and entry was changed to an open entry.  The skin incision at the umbilicus was extended.  Using [employer] retractors the fascia was visualized grasped with two Kocher clamps and incised using Mayo scissors.  Once peritoneal entry had been achieved, a 9mm Hassan trocar was placed, insufflation was started and pneumoperitoneum established at a pressure of .   General inspection of the abdomen revealed the above noted findings.  Two additional 22mm bariatric trocars were placed under direct visualization in the right upper and left lower quadrant.  Several omental adhesions obscured clear visualization of the pelvis and were taken down using a 68mm Harmonic scalpel.  The right tube was grasped at its fimbriated end, transected from it attachments to the ovary, mesosalpinx, and uterus using a 46mm Harmonic scalpel.  The same procedure was repeated on the patient left fallopian tube.  Additional cautery was required to achieve hemostasis on the right ovarian pedicle.  1g of Arista was applied as well.   Pneumoperitoneum was evacuated.  The trocars were removed.  The 85mm trocar site was closed with 0 Vicryl in a running fashion for fascial closure followed by 4-0 Monocryl in a subcuticular fashion for the skin.  All trocar sites were then dressed with surgical skin glue.   Sponge needle and instrument counts were correct time two.  The patient tolerated the procedure well  and was taken to the recovery room in stable condition.

## 2021-02-14 NOTE — Transfer of Care (Signed)
Immediate Anesthesia Transfer of Care Note  Patient: Berdell Nevitt  Procedure(s) Performed: LAPAROSCOPIC BILATERAL PARTIAL SALPINGECTOMY (Bilateral) INTRAUTERINE DEVICE (IUD) REMOVAL  Patient Location: PACU  Anesthesia Type:General  Level of Consciousness: drowsy and responds to stimulation  Airway & Oxygen Therapy: Patient Spontanous Breathing and Patient connected to face mask oxygen  Post-op Assessment: Report given to RN and Post -op Vital signs reviewed and stable  Post vital signs: stable  Last Vitals:  Vitals Value Taken Time  BP 130/71 02/14/21 1356  Temp    Pulse 72 02/14/21 1400  Resp 0 02/14/21 1400  SpO2 99 % 02/14/21 1400  Vitals shown include unvalidated device data.  Last Pain:  Vitals:   02/14/21 1133  TempSrc: Temporal  PainSc: 0-No pain         Complications: No notable events documented.

## 2021-02-15 ENCOUNTER — Encounter: Payer: Self-pay | Admitting: Obstetrics and Gynecology

## 2021-02-15 LAB — SURGICAL PATHOLOGY

## 2021-02-23 ENCOUNTER — Ambulatory Visit (INDEPENDENT_AMBULATORY_CARE_PROVIDER_SITE_OTHER): Payer: BC Managed Care – PPO | Admitting: Obstetrics and Gynecology

## 2021-02-23 ENCOUNTER — Other Ambulatory Visit: Payer: Self-pay

## 2021-02-23 ENCOUNTER — Encounter: Payer: Self-pay | Admitting: Obstetrics and Gynecology

## 2021-02-23 VITALS — BP 116/74 | Wt 334.0 lb

## 2021-02-23 DIAGNOSIS — Z4889 Encounter for other specified surgical aftercare: Secondary | ICD-10-CM

## 2021-02-23 NOTE — Progress Notes (Signed)
      Postoperative Follow-up Patient presents post op from laparoscopic bilateral salpingectomy and Mirena IUD removal 1weeks ago for requested sterilization.  Subjective: Patient reports some improvement in her preop symptoms. Eating a regular diet without difficulty. Pain is controlled with current analgesics. Medications being used: prescription NSAID's including ibuprofen (Motrin) and narcotic analgesics including oxycodone/acetaminophen (Percocet, Tylox).  Activity: normal activities of daily living.  Objective: Blood pressure 116/74, weight (!) 334 lb (151.5 kg), last menstrual period 02/01/2021, not currently breastfeeding.  General: NAD Pulmonary: no increased work of breathing Abdomen: soft, non-tender, non-distended, incision(s) D/C/I, the umbilical incision has some minor superficial skin seperation, no evidence of erythema, purlunet discharge, or induration.  Re-dressed with dermabond and steri strips. Extremities: no edema Neurologic: normal gait   Admission on 02/14/2021, Discharged on 02/14/2021  Component Date Value Ref Range Status   Preg Test, Ur 02/14/2021 NEGATIVE  NEGATIVE Final   Comment:        THE SENSITIVITY OF THIS METHODOLOGY IS >24 mIU/mL    SURGICAL PATHOLOGY 02/14/2021    Final-Edited                   Value:SURGICAL PATHOLOGY CASE: [id number] PATIENT: Debra Mcdaniel Surgical Pathology Report     Specimen Submitted: A. Fallopian tubes, bilateral, partial  Clinical History: Unwanted fertility, IUD migration      DIAGNOSIS: A. PARTIAL FALLOPIAN TUBES, BILATERAL; STERILIZATION: - FRAGMENTS OF FALLOPIAN TUBE, FIMBRIATED END, AND BENIGN PARATUBAL CYSTS. - FULL CROSS-SECTION OF FALLOPIAN TUBE LUMEN ARE IDENTIFIED.  GROSS DESCRIPTION: A. Labeled: Bilateral partial fallopian tubes Received: Fresh Collection time: 1:11 PM on 02/14/2021 Placed into formalin time: 2:09 PM on 02/14/2021 Size: Aggregate 3.7 x 3.5 x 1.5 cm Other findings:  Received are fragments of tan-pink soft tissue. 2 fragments are fimbriated.  The fragments of tan-pink, smooth, and glistening serosal surfaces.  The identifiable lumens are pinpoint and patent. 1 fragment, 1.4 x 0.8 x 0.8 cm, is a unilocular cyst which contains clear serous contents and has smooth grossly unremarkable walls.  Bl                         ock summary: 1 - 2 - fallopian tube fimbria, longitudinally bisected and submitted entirely 3 - representative fallopian tube cross-sections 4 - cyst, trisected and submitted entirely  RB 02/14/2021  Final Diagnosis performed by Elijah Birk, MD.   Electronically signed 02/15/2021 11:29:39AM The electronic signature indicates that the named Attending Pathologist has evaluated the specimen Technical component performed at [facility], [address], [place], [place] [address] Lab: [phone] Dir: Jolene Schimke, MD, MMM  Professional component performed at [facility], [facility], [address] [place], [place], [place] [address] Lab: [phone] Dir: Beryle Quant, MD     Assessment: 30 y.o. s/p laparoscopic bilateral salpingectomy stable  Plan: Patient has done well after surgery with no apparent complications.  I have discussed the post-operative course to date, and the expected progress moving forward.  The patient understands what complications to be concerned about.  I will see the patient in routine follow up, or sooner if needed.    Activity plan: No restriction.  Possible early seroma vs small wound seperation no evidence of infection.  Re-dressed with dermabond and steri strips follow up 1 week   [name], MD, [facility], [facility] 02/23/2021, 4:39 PM

## 2021-02-28 ENCOUNTER — Encounter: Payer: Self-pay | Admitting: Obstetrics and Gynecology

## 2021-02-28 ENCOUNTER — Other Ambulatory Visit: Payer: Self-pay

## 2021-02-28 ENCOUNTER — Ambulatory Visit (INDEPENDENT_AMBULATORY_CARE_PROVIDER_SITE_OTHER): Payer: BC Managed Care – PPO | Admitting: Obstetrics and Gynecology

## 2021-02-28 VITALS — BP 120/70 | Ht 62.0 in | Wt 332.0 lb

## 2021-02-28 DIAGNOSIS — Z4889 Encounter for other specified surgical aftercare: Secondary | ICD-10-CM

## 2021-02-28 MED ORDER — METRONIDAZOLE 0.75 % VA GEL
1.0000 | Freq: Every day | VAGINAL | 1 refills | Status: AC
Start: 2021-02-28 — End: 2021-03-05

## 2021-03-02 DIAGNOSIS — T8332XA Displacement of intrauterine contraceptive device, initial encounter: Secondary | ICD-10-CM

## 2021-03-02 DIAGNOSIS — Z3009 Encounter for other general counseling and advice on contraception: Secondary | ICD-10-CM

## 2021-03-02 NOTE — Telephone Encounter (Signed)
Mirena rcvd/charged 05/16/20

## 2021-03-07 NOTE — Progress Notes (Signed)
      Postoperative Follow-up Patient presents post op from laparoscopic bilateral salpingectomy and mirena IUD removal 2weeks ago for requested sterilization.  Subjective: Patient reports some improvement in her preop symptoms. Eating a regular diet without difficulty. Pain is controlled without any medications.  Activity: normal activities of daily living.  Objective: Blood pressure 120/70, height 5\' 2"  (1.575 m), weight (!) 332 lb (150.6 kg), last menstrual period 02/01/2021, not currently breastfeeding.  General: NAD Pulmonary: no increased work of breathing Abdomen: soft, non-tender, non-distended, incision(s) D/C/I The umbilical trocar site has remained closed following re-closure last week Extremities: no edema Neurologic: normal gait  Admission on 02/14/2021, Discharged on 02/14/2021  Component Date Value Ref Range Status   Preg Test, Ur 02/14/2021 NEGATIVE  NEGATIVE Final   Comment:        THE SENSITIVITY OF THIS METHODOLOGY IS >24 mIU/mL    SURGICAL PATHOLOGY 02/14/2021    Final-Edited                   Value:SURGICAL PATHOLOGY CASE: [id number] PATIENT: Carianna Ferrentino Surgical Pathology Report     Specimen Submitted: A. Fallopian tubes, bilateral, partial  Clinical History: Unwanted fertility, IUD migration      DIAGNOSIS: A. PARTIAL FALLOPIAN TUBES, BILATERAL; STERILIZATION: - FRAGMENTS OF FALLOPIAN TUBE, FIMBRIATED END, AND BENIGN PARATUBAL CYSTS. - FULL CROSS-SECTION OF FALLOPIAN TUBE LUMEN ARE IDENTIFIED.  GROSS DESCRIPTION: A. Labeled: Bilateral partial fallopian tubes Received: Fresh Collection time: 1:11 PM on 02/14/2021 Placed into formalin time: 2:09 PM on 02/14/2021 Size: Aggregate 3.7 x 3.5 x 1.5 cm Other findings: Received are fragments of tan-pink soft tissue. 2 fragments are fimbriated.  The fragments of tan-pink, smooth, and glistening serosal surfaces.  The identifiable lumens are pinpoint and patent. 1 fragment, 1.4 x 0.8 x 0.8  cm, is a unilocular cyst which contains clear serous contents and has smooth grossly unremarkable walls.  Bl                         ock summary: 1 - 2 - fallopian tube fimbria, longitudinally bisected and submitted entirely 3 - representative fallopian tube cross-sections 4 - cyst, trisected and submitted entirely  RB 02/14/2021  Final Diagnosis performed by 04/16/2021, MD.   Electronically signed 02/15/2021 11:29:39AM The electronic signature indicates that the named Attending Pathologist has evaluated the specimen Technical component performed at [facility], [address] [place], [place] Lab: [phone] Dir: [phone], MD, MMM  Professional component performed at [facility], [facility], [address] [place], [place], [place] Lab: [phone] Dir: [phone], MD     Assessment: 30 y.o. s/p laparoscopic bilateral salpingectomy and Mirena IUD removal stable  Plan: Patient has done well after surgery with no apparent complications.  I have discussed the post-operative course to date, and the expected progress moving forward.  The patient understands what complications to be concerned about.  I will see the patient in routine follow up, or sooner if needed.    Activity plan: No heavy lifting.   37, MD, [name] [facility], [facility] 03/07/2021, 9:09 AM

## 2021-03-28 ENCOUNTER — Ambulatory Visit (INDEPENDENT_AMBULATORY_CARE_PROVIDER_SITE_OTHER): Payer: BC Managed Care – PPO | Admitting: Obstetrics and Gynecology

## 2021-03-28 ENCOUNTER — Other Ambulatory Visit: Payer: Self-pay

## 2021-03-28 VITALS — BP 120/78 | Ht 64.0 in | Wt 327.0 lb

## 2021-03-28 DIAGNOSIS — N920 Excessive and frequent menstruation with regular cycle: Secondary | ICD-10-CM

## 2021-03-28 DIAGNOSIS — Z4889 Encounter for other specified surgical aftercare: Secondary | ICD-10-CM

## 2021-03-28 MED ORDER — NORETHINDRONE ACETATE 5 MG PO TABS: 5.0000 mg | ORAL_TABLET | Freq: Every day | ORAL | 11 refills | Status: AC

## 2021-03-28 NOTE — Progress Notes (Signed)
      Postoperative Follow-up Patient presents post op from Mirena IUD removal, laparoscopic bilateral salpingectomy 6weeks ago for malposition of IUD, desired surgical sterilization  Subjective: Patient reports some improvement in her preop symptoms. Eating a regular diet without difficulty. The patient is not having any pain.  Activity: normal activities of daily living.  Objective: Blood pressure 120/78, height 5\' 4"  (1.626 m), weight (!) 327 lb (148.3 kg), last menstrual period 03/11/2021, not currently breastfeeding.  Admission on 02/14/2021, Discharged on 02/14/2021  Component Date Value Ref Range Status   Preg Test, Ur 02/14/2021 NEGATIVE  NEGATIVE Final   Comment:        THE SENSITIVITY OF THIS METHODOLOGY IS >24 mIU/mL    SURGICAL PATHOLOGY 02/14/2021    Final-Edited                   Value:SURGICAL PATHOLOGY CASE: [id number] PATIENT: Debra Mcdaniel Surgical Pathology Report     Specimen Submitted: A. Fallopian tubes, bilateral, partial  Clinical History: Unwanted fertility, IUD migration      DIAGNOSIS: A. PARTIAL FALLOPIAN TUBES, BILATERAL; STERILIZATION: - FRAGMENTS OF FALLOPIAN TUBE, FIMBRIATED END, AND BENIGN PARATUBAL CYSTS. - FULL CROSS-SECTION OF FALLOPIAN TUBE LUMEN ARE IDENTIFIED.  GROSS DESCRIPTION: A. Labeled: Bilateral partial fallopian tubes Received: Fresh Collection time: 1:11 PM on 02/14/2021 Placed into formalin time: 2:09 PM on 02/14/2021 Size: Aggregate 3.7 x 3.5 x 1.5 cm Other findings: Received are fragments of tan-pink soft tissue. 2 fragments are fimbriated.  The fragments of tan-pink, smooth, and glistening serosal surfaces.  The identifiable lumens are pinpoint and patent. 1 fragment, 1.4 x 0.8 x 0.8 cm, is a unilocular cyst which contains clear serous contents and has smooth grossly unremarkable walls.  Bl                         ock summary: 1 - 2 - fallopian tube fimbria, longitudinally bisected and  submitted entirely 3 - representative fallopian tube cross-sections 4 - cyst, trisected and submitted entirely  RB 02/14/2021  Final Diagnosis performed by 04/16/2021, MD.   Electronically signed 02/15/2021 11:29:39AM The electronic signature indicates that the named Attending Pathologist has evaluated the specimen Technical component performed at [facility], [address] [place], [place] Lab: [phone] Dir: [phone], MD, MMM  Professional component performed at [facility], [facility], [address] [place], [place], [place] Lab: [phone] Dir: [phone], MD     Assessment: 30 y.o. s/p Mirena IUD insertion, and laparoscopic bilateral salpingectomy stable  Plan: Patient has done well after surgery with no apparent complications.  I have discussed the post-operative course to date, and the expected progress moving forward.  The patient understands what complications to be concerned about.  I will see the patient in routine follow up, or sooner if needed.    Activity plan: No restriction.  Menorrhagia - first cycle was heavy, Rx for norethindrone 5mg  for cycle supression   37, MD, OB/GYN, [facility] 03/28/2021, 4:35 PM

## 2021-04-20 ENCOUNTER — Encounter: Payer: Self-pay | Admitting: Dietician

## 2021-04-20 ENCOUNTER — Other Ambulatory Visit: Payer: Self-pay

## 2021-04-20 ENCOUNTER — Encounter: Payer: BC Managed Care – PPO | Attending: Internal Medicine | Admitting: Dietician

## 2021-04-20 VITALS — Ht 63.0 in | Wt 330.5 lb

## 2021-04-20 DIAGNOSIS — Z6841 Body Mass Index (BMI) 40.0 and over, adult: Secondary | ICD-10-CM

## 2021-04-20 NOTE — Progress Notes (Signed)
Medical Nutrition Therapy: Visit start time: 1630  end time: 1750  Assessment:  Diagnosis: obesity Past medical history: IBS-D Psychosocial issues/ stress concerns: anxiety, depression  Preferred learning method:  Auditory Visual Hands-on   Current weight: 330.5lbs Height: 5'3" Medications, supplements: none taken at this time  Progress and evaluation:  Reports having 2 pregnancies since 2020; was checking into weight loss surgery when discovering pregnancy. 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 Has 3 children at home, oldest is 60yrs old.  Husband works 12 hour shifts, limits time for cooking/ family meals Reports ome emotional eating, and some times with poor appetite due to stress, periods of depression. Works from home currently which has not helped with control with eating.   Time for cooking is short, oldest son is picky eater. Patient is trying new foods for her health and encourage son to expand food acceptance.  Wants to try some meal prepping for more meals at home. Tried keto diet in the past, did not like the eating pattern/ food choices; tried herbalife, felt exercise was helping more than the shakes; also did Weight watchers which helped but was too expensive.  Reports intolerance to dairy, lactose free milk and cheese also not tolerated. Often feels bloated after eating pasta, sometimes other gluten containing foods. Patient is interested in investigating weight loss surgery again.   Physical activity: caring for 2 toddlers, no structured exercise  Dietary Intake:  Usual eating pattern includes 2-3 meals and 1-2 snacks per day. Dining out frequency: 5-6 meals per week.  Breakfast: 9-10am  chicken biscuit; mixed fruit bowl; does not like eggs; sometimes skips Snack: none Lunch: 1:30pm -- burger (often no bun) with fries; pizza; leftovers ie taco; grilled chicken/ wings; salad with chicken Snack: maybe chips;  snack cake; rice cake; ice cream Supper: 6pm -- tacos, burger and fries; gr beef with gravy; chicken + spinach and mashed potatoes; occ spaghetti (feels bloated after); lasagna Snack: same as pm Beverages: water, Sparkling Ice drink, some fruit juice ie orange or apple, rarely tea when dining out, avoids sodas in the home.  Nutrition Care Education: Topics covered:  Basic nutrition: basic food groups, appropriate nutrient balance, appropriate meal and snack schedule, general nutrition guidelines    Weight control: importance of low sugar and low fat choices, portion control strategies including use of low carb veg to stretch portions of higher calorie foods; estimated energy needs for weight loss at 1400 kcal, provided guidance for 45% CHO, 25% pro, 30% fat; discussed roles of stress, sleep, and physical activity Advanced nutrition:  cooking techniques -- meal prepping for time savings IBS: role of stress; option of following low fiber, low fat diet for a period of time, potential help from probiotic supplement, avoiding large portions of gluten containing foods due to reported symptoms Other: discussed bariatric surgery and determining readiness, surgery options, contacting surgery practice for interest seminars/ more information  Nutritional Diagnosis:  [place]-2.1 Inpaired nutrition utilization 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  As evidenced by patient reported symptoms. [place]-3.3 Overweight/obesity As related to excess calories, inadequate physical activity, stress, inadequate sleep.  As evidenced by patient with current BMI of 58.55.  Intervention:  Instruction and discussion as noted above. Patient voices readiness to work on diet and lifestyle changes. Busy schedule with work and young children add challenge to making significant changes. Patient will further pursue weight loss surgery options; no MNT follow up scheduled at this time. Patient will schedule later if  needed.  Education Materials  given:  Designer, industrial/product with food lists, sample meal pattern Am I Ready? Handout for bariatric surgery (AND) via email Planned[facility] handout via email Visit summary with goals/ instructions to be viewed in MyChart   Learner/ who was taught:  Patient    Level of understanding: Verbalizes/ demonstrates competency   Demonstrated degree of understanding via:   Teach back Learning barriers: None  Willingness to learn/ readiness for change: Acceptance, ready for change   Monitoring and Evaluation:  Dietary intake, exercise, IBS symptoms, and body weight      follow up: prn

## 2021-04-20 NOTE — Patient Instructions (Signed)
Check [facility] website for seminar options.  Begin some meal prep strategies to reduce restaurant/ fast food meals. Work on starting some regular exercise, consider time and type options.  Increase time for rest.

## 2021-04-27 ENCOUNTER — Encounter: Payer: Self-pay | Admitting: Obstetrics and Gynecology

## 2021-11-02 IMAGING — US US MFM OB COMP +14 WKS
1 series · 13 of 28 positions shown · non-contrast
Comparison: none

[Series 1: us mfm ob comp +14 wks · 66 acquisitions, 13 frames shown]
[im 3/66]
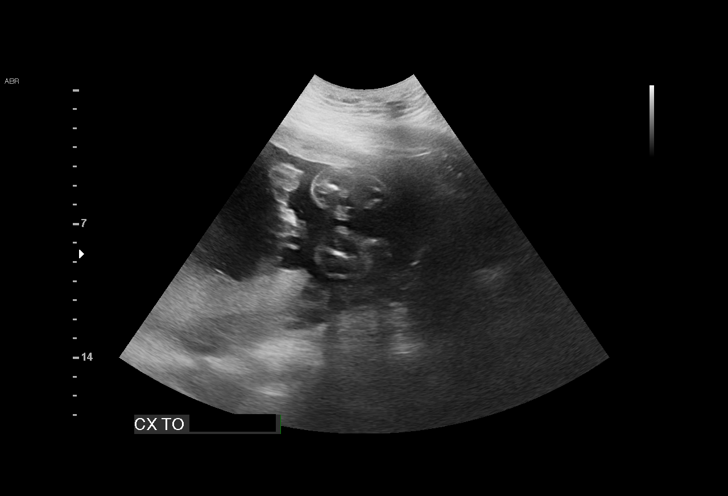
[im 8/66]
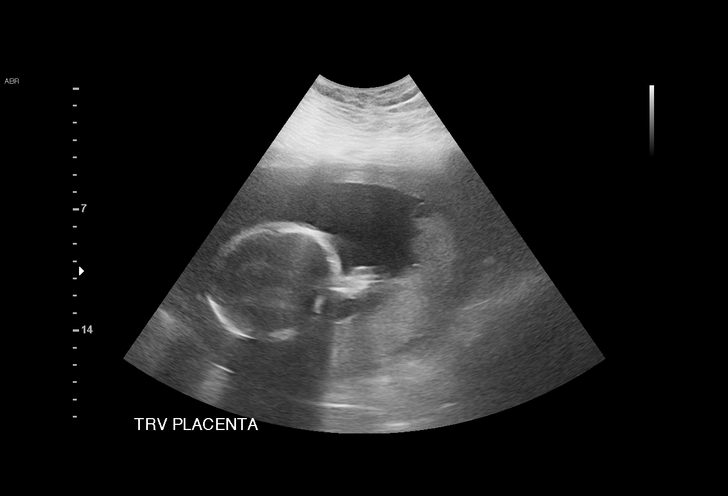
[im 13/66]
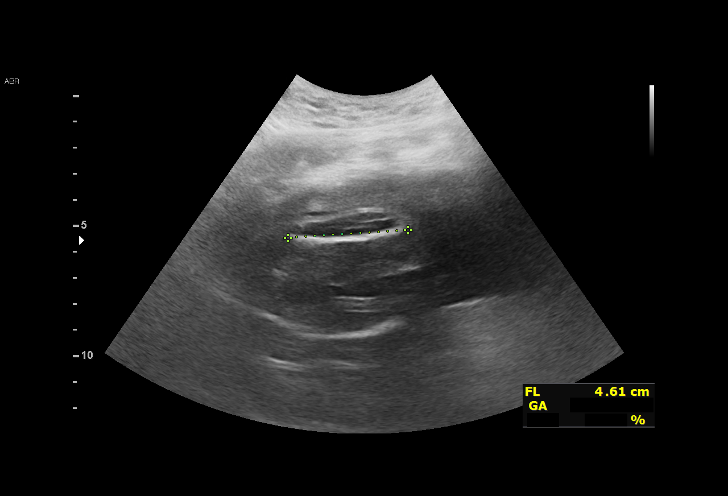
[im 17/66]
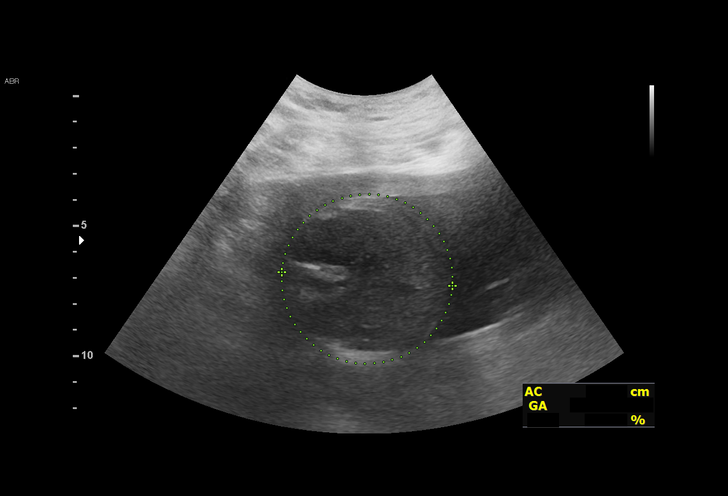
[im 22/66]
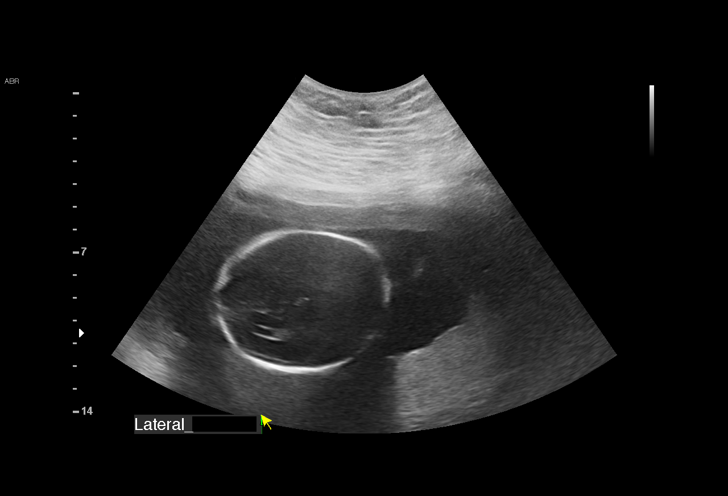
[im 27/66]
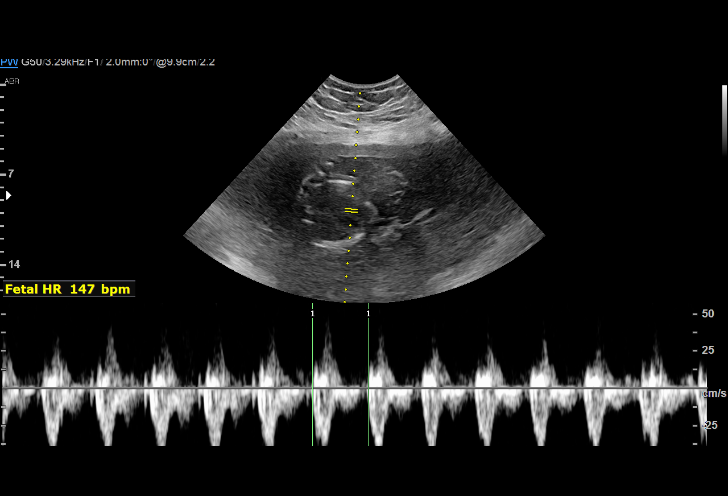
[im 34/66]
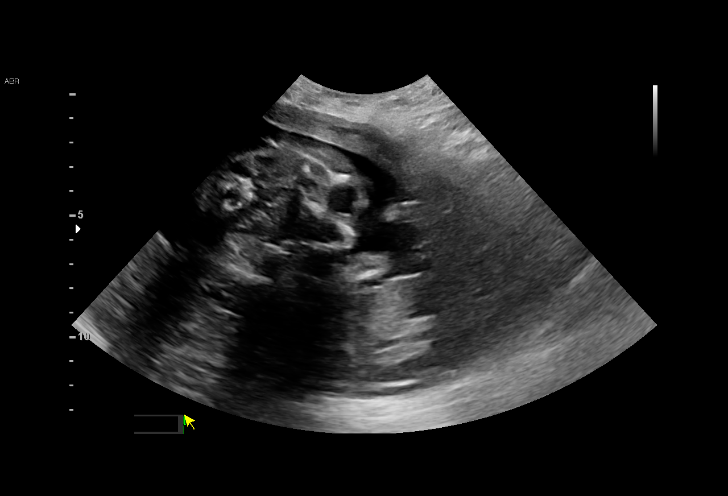
[im 39/66]
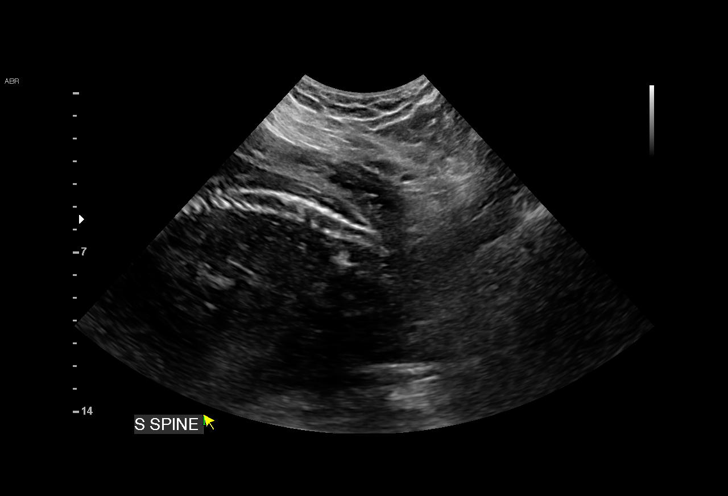
[im 44/66]
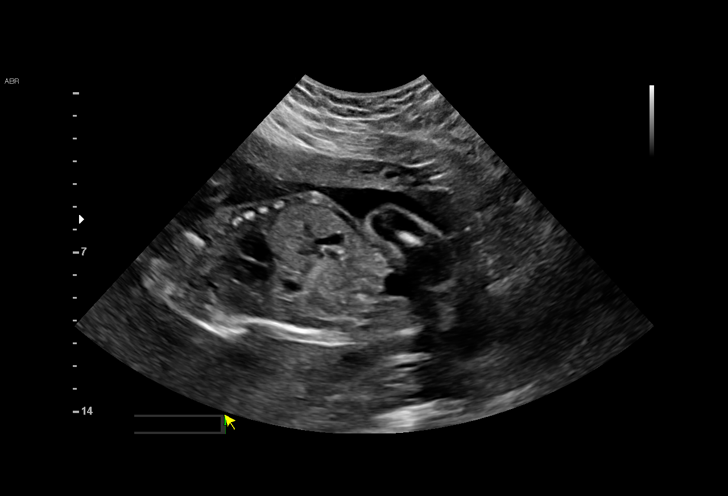
[im 49/66]
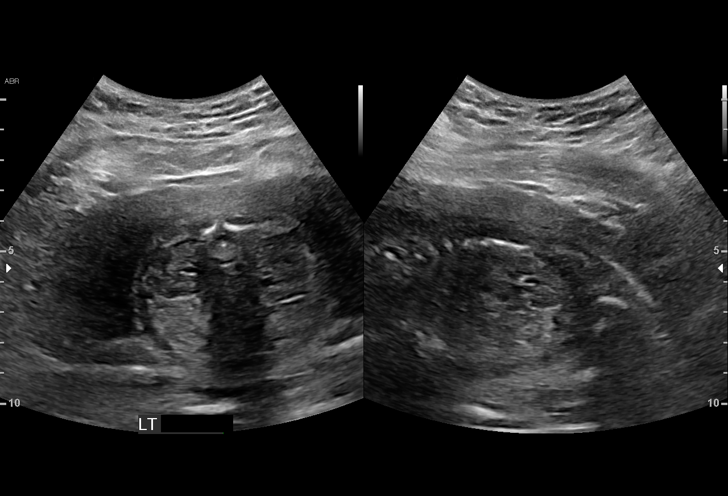
[im 53/66]
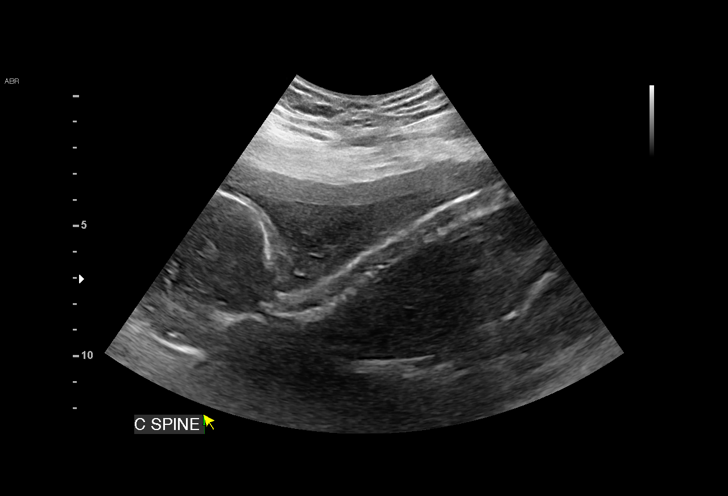
[im 58/66]
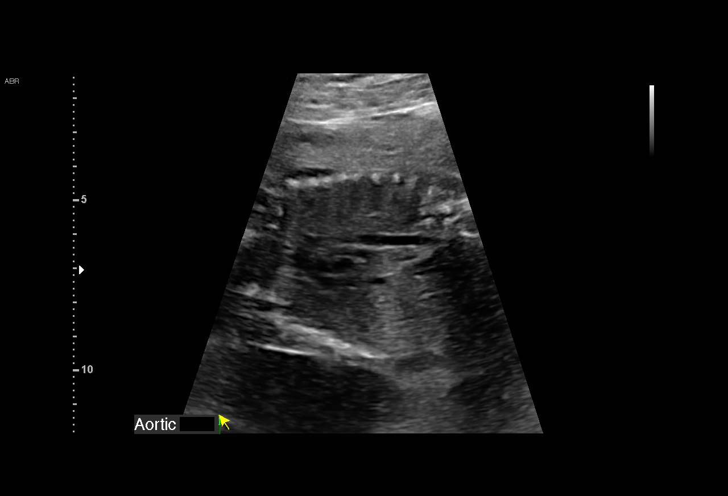
[im 63/66]
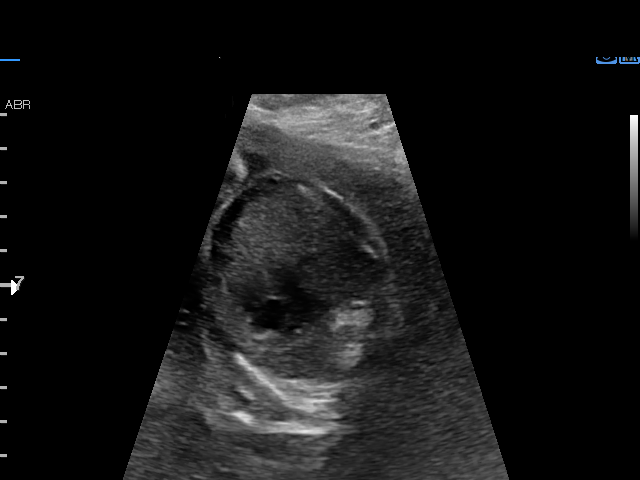

[13 of 28 positions shown; findings below may reference images not displayed]

at [HOSPITAL]
                                                             [place]

 1  US MFM OB COMP + 14 WK                [address].01    KYRIE BARRANTES

Indications

 25 weeks gestation of pregnancy
 Obesity complicating pregnancy, second
 trimester
 History of cesarean delivery, currently
 pregnant
Fetal Evaluation

 Num Of Fetuses:          1
 Fetal Heart              147
 Rate(bpm):
 Cardiac Activity:        Observed
 Fetal Lie:               Variable
 Presentation:            Variable
 Placenta:                Posterior
 P. Cord Insertion:       Visualized, central
Biometry

 BPD:      60.1  mm     G. Age:  24w 4d         20  %    CI:         71.73  %    70 - 86
                                                         FL/HC:       20.3  %    18.7 -
 HC:      225.9  mm     G. Age:  24w 4d         14  %    HC/AC:       1.11       1.04 -
 AC:      202.8  mm     G. Age:  24w 6d         33  %    FL/BPD:      76.4  %    71 - 87
 FL:       45.9  mm     G. Age:  25w 2d         39  %    FL/AC:       22.6  %    20 - 24
 HUM:      40.3  mm     G. Age:  24w 3d         28  %
 CER:      27.1  mm     G. Age:  24w 2d         30  %

 LV:        3.3  mm
 CM:        4.7  mm
 Est. FW:     755   g    1 lb 11 oz      32  %
                    m
Gestational Age

 LMP:           25w 1d        Date:  07/01/19                 EDD:    04/06/20
 U/S Today:     24w 6d                                        EDD:    04/08/20
 Best:          25w 1d     Det. By:  LMP  (07/01/19)          EDD:    04/06/20
Anatomy

 Cranium:               Appears normal         Aortic Arch:            Appears normal
 Cavum:                 Appears normal         Ductal Arch:            Not well visualized
 Ventricles:            Appears normal         Diaphragm:              Appears normal
 Choroid Plexus:        Appears normal         Stomach:                Appears normal,
                                                                       left sided
 Cerebellum:            Appears normal         Abdomen:                Appears normal
 Posterior Fossa:       Appears normal         Abdominal Wall:         Appears nml (cord
                                                                       insert, abd wall)
 Nuchal Fold:           Appears normal         Cord Vessels:           Appears normal (3
                                                                       vessel cord)
 Face:                  Orbits nl; profile not Kidneys:                Appear normal
                        well visualized
 Lips:                  Not well visualized    Bladder:                Appears normal
 Heart:                 Appears normal         Spine:                  Appears normal
                        (4CH, axis, and
                        situs)
 RVOT:                  Appears normal         Upper Extremities:      Appears normal
 LVOT:                  Not well visualized    Lower Extremities:      Appears normal
Impression

 Ms. Waldemar is here to evaluate fetal stomach as it was not
 observed during the anatomy exam at her providers office.

 Today we observed a single intrauterine pregnancy with
 measurements consistent with dates.
 There was normal anatomy and in particular the stomach
 was observed.
 Given maternal habitus suboptimal views of the fetal
 anatomy were observed today however, they were previously
 documented on an outside examination.

 Ms. Waldemar has a prior history of cesarean delivery which will
 be performed in [DATE] weeks. She also has
 chronic headaches [place] she states have been well controlled.

 Her prior pregnancies were uncomplicated per her report.

 At this time given Ms. Bayane BMI I recommend serial
 growth exams every 4 weeks.

 I discussed today's visit with Dr. Jimbo who primary
 wanted us to assess the fetal stomach with consult if the
 stomach was absent. Other elements of her history are well
 controlled.
Recommendations

 Continue serial growth every 4 weeks this was not scheduled
 at our offices however, if desired please don't hesitate to
 contact us.
 We appreciate participating in the care of your patient.

## 2022-07-02 ENCOUNTER — Encounter: Payer: Self-pay | Admitting: Obstetrics and Gynecology

## 2022-07-09 ENCOUNTER — Ambulatory Visit: Payer: 59

## 2022-07-09 ENCOUNTER — Other Ambulatory Visit (HOSPITAL_COMMUNITY)
Admission: RE | Admit: 2022-07-09 | Discharge: 2022-07-09 | Disposition: A | Discharge status: Home / Self Care | Payer: 59 | Source: Ambulatory Visit | Attending: Obstetrics and Gynecology | Admitting: Obstetrics and Gynecology

## 2022-07-09 VITALS — BP 109/70 | HR 81 | Ht 63.0 in | Wt 337.3 lb

## 2022-07-09 DIAGNOSIS — N898 Other specified noninflammatory disorders of vagina: Secondary | ICD-10-CM | POA: Diagnosis not present

## 2022-07-09 NOTE — Progress Notes (Signed)
    NURSE VISIT NOTE  Subjective:    Patient ID: Debra Mcdaniel, female    DOB: August 02, 1990, 32 y.o.   MRN: [id number]  HPI  Patient is a 32 y.o. [id number] female who presents for clear vaginal discharge, vaginal odor and discharge for 2 week(s). Denies abnormal vaginal bleeding or significant pelvic pain or fever. denies UTI symtpoms. Patient denies history of known exposure to STD.   Objective:    Ht '5\' 3"'$  (1.6 m)   Wt (!) 337 lb 4.8 oz (153 kg)   BMI 59.75 kg/m     Assessment:   1. Vaginal odor   2. Vagina itching      Plan:   BV, Yeast and STD DNA  probe sent to lab. Treatment: await results for further treatment ROV prn if symptoms persist or worsen.   Marykay Lex, CMA

## 2022-07-10 ENCOUNTER — Encounter: Payer: Self-pay | Admitting: Obstetrics and Gynecology

## 2022-07-10 ENCOUNTER — Other Ambulatory Visit: Payer: Self-pay | Admitting: Obstetrics and Gynecology

## 2022-07-10 LAB — CERVICOVAGINAL ANCILLARY ONLY
Bacterial Vaginitis (gardnerella): POSITIVE — AB
Candida Glabrata: NEGATIVE
Candida Vaginitis: POSITIVE — AB
Chlamydia: NEGATIVE
Comment: NEGATIVE
Comment: NEGATIVE
Comment: NEGATIVE
Comment: NEGATIVE
Comment: NEGATIVE
Comment: NORMAL
Neisseria Gonorrhea: NEGATIVE
Trichomonas: NEGATIVE

## 2022-07-10 MED ORDER — METRONIDAZOLE 500 MG PO TABS: 500.0000 mg | ORAL_TABLET | Freq: Two times a day (BID) | ORAL | 0 refills | Status: DC

## 2022-07-10 MED ORDER — FLUCONAZOLE 150 MG PO TABS: 150.0000 mg | ORAL_TABLET | ORAL | 3 refills | Status: DC

## 2022-09-26 ENCOUNTER — Ambulatory Visit: Payer: BC Managed Care – PPO | Admitting: Obstetrics and Gynecology

## 2022-11-26 ENCOUNTER — Ambulatory Visit: Payer: Managed Care, Other (non HMO)

## 2022-11-26 ENCOUNTER — Other Ambulatory Visit (HOSPITAL_COMMUNITY)
Admission: RE | Admit: 2022-11-26 | Discharge: 2022-11-26 | Disposition: A | Discharge status: Home / Self Care | Payer: Managed Care, Other (non HMO) | Source: Ambulatory Visit

## 2022-11-26 VITALS — BP 120/78 | HR 90 | Ht 62.0 in | Wt 335.0 lb

## 2022-11-26 DIAGNOSIS — Z01419 Encounter for gynecological examination (general) (routine) without abnormal findings: Secondary | ICD-10-CM

## 2022-11-26 DIAGNOSIS — B9689 Other specified bacterial agents as the cause of diseases classified elsewhere: Secondary | ICD-10-CM

## 2022-11-26 NOTE — Progress Notes (Signed)
Outpatient Gynecology Note: Annual Visit  Assessment/Plan:    Debra Mcdaniel is a 32 y.o. female [phone] with normal well-woman gynecologic exam.   Well woman exam - Reviewed health maintenance topics as documented below. - Cervical cancer screening pap smear WNL in 2022, due for repeat in 2025. - Offered STI screening, accepts swab, declines blood work.  - Reviewed options for heavy menstrual bleeding including uses of combined estrogen/progesterone contraception or LNG-IUD. She has previously not had a good experience with IUD. She feels that her periods are manageable at this time and she does not want to deal with possible side effects of hormones but is aware of options if she changes her mind.  - Reviewed use of boric acid for managing recurrent episodes of BV. Resources sent via MyChart.     Risk factors identified in Subjective to review: none No orders of the defined types were placed in this encounter.  Current Outpatient Medications  Medication Instructions   amphetamine-dextroamphetamine (ADDERALL) 10 MG tablet 10 mg, Oral, Every morning   ferrous sulfate 325 mg, Oral, 3 times daily with meals   fluconazole (DIFLUCAN) 150 mg, Oral, Every 3 DAYS, For three doses   fluticasone (FLONASE) 50 MCG/ACT nasal spray 2 sprays, Each Nare, Daily PRN   ibuprofen (ADVIL) 600 mg, Oral, Every 6 hours PRN   metroNIDAZOLE (FLAGYL) 500 mg, Oral, 2 times daily   Multiple Vitamin (MULTIVITAMIN WITH MINERALS) TABS tablet 1 tablet, Oral, Daily   norethindrone (AYGESTIN) 5 mg, Oral, Daily    Return in about 1 year (around 11/26/2023) for Annual physical.    Subjective:    Debra Mcdaniel is a 32 y.o. female G11P3003 who presents for annual wellness visit.   Occupation works from home.    Lives with husband and 3 children.    CONCERNS? Concerned about continued BV symptoms. Last time she was treated was in March of this year.   Well Woman Visit:  GYN HISTORY:  Patient's last  menstrual period was 11/17/2022.     Menstrual History: OB History     Gravida  3   Para  3   Term  3   Preterm  0   AB  0   Living  3      SAB  0   IAB  0   Ectopic  0   Multiple  0   Live Births  64           Menarche age: 17 Patient's last menstrual period was 11/17/2022.     Periods are irregular but comes every month and last 6-7 days. Dysmenorrhea: very heavy periods. Uses double pads. Cyclic symptoms include: cramping on Day 1-2.  Intermenstrual bleeding, spotting, or discharge? no Urinary incontinence? no  Sexually active: yes Number of sexual partners: one Gender of sexual Partners: male Dyspareunia? no Last pap: NILM in 2022 History of abnormal Pap: no Gardasil series:  unsure STI history: no Contraceptive methods:  BTL .  Health Maintenance > Reviewed breast self-awareness. Unsure of family history. > History of abnormal mammogram: No > Exercise: walking, moderately active > Dietary Supplements: PNV with iron > Body mass index is 61.27 kg/m.  > Recent dental visit Yes.   > Seat Belt Use: Yes.   > Texting and driving? No. > Guns in the house Yes.  , locked > STI/HIV testing or immunizations needed? Yes.   > Concern for alcohol abuse? none   Tobacco or other drug use: denied. Tobacco Use: Low  Risk  (07/09/2022)   Patient History    Smoking Tobacco Use: Never    Smokeless Tobacco Use: Never    Passive Exposure: Not on file     PHQ-2 Score: In last two weeks, how often have you felt: Little interest or pleasure in doing things: Several days (+1) Feeling down, depressed or hopeless: Not at all (0) Score: 1  GAD-2 Over the last 2 weeks, how often have you been bothered by the following problems? Feeling nervous, anxious or on edge: Nearly every day (+3) Not being able to stop or control worrying: Several days (+1)} Score: 4  Seeing PCP for management of anxiety and other regular  labs   _________________________________________________________  Current Outpatient Medications  Medication Sig Dispense Refill   amphetamine-dextroamphetamine (ADDERALL) 10 MG tablet Take 10 mg by mouth every morning.     ferrous sulfate 325 (65 FE) MG EC tablet Take 325 mg by mouth 3 (three) times daily with meals.     Multiple Vitamin (MULTIVITAMIN WITH MINERALS) TABS tablet Take 1 tablet by mouth daily.     fluconazole (DIFLUCAN) 150 MG tablet Take 1 tablet (150 mg total) by mouth every 3 (three) days. For three doses 3 tablet 3   fluticasone (FLONASE) 50 MCG/ACT nasal spray Place 2 sprays into both nostrils daily as needed for allergies or rhinitis.     ibuprofen (ADVIL) 600 MG tablet Take 1 tablet (600 mg total) by mouth every 6 (six) hours as needed for cramping or mild pain. (Patient not taking: Reported on 04/20/2021) 40 tablet 0   metroNIDAZOLE (FLAGYL) 500 MG tablet Take 1 tablet (500 mg total) by mouth 2 (two) times daily. 14 tablet 0   norethindrone (AYGESTIN) 5 MG tablet Take 1 tablet (5 mg total) by mouth daily. (Patient not taking: Reported on 04/20/2021) 30 tablet 11   No current facility-administered medications for this visit.   Allergies  Allergen Reactions   Nickel Rash    Past Medical History:  Diagnosis Date   ADHD    Anxiety    BMI 50.0-59.9, adult (HCC)    Complication of anesthesia    has NV right before both csections   Depression    Family history of adverse reaction to anesthesia    mother but not sure what happened.   Headache    IBS (irritable bowel syndrome)    PONV (postoperative nausea and vomiting)    had after 1 of her c sections   Past Surgical History:  Procedure Laterality Date   CESAREAN SECTION     CESAREAN SECTION N/A 03/15/2019   Procedure: CESAREAN SECTION;  Surgeon: [name], MD;  Location: [facility];  Service: Obstetrics;  Laterality: N/A;   CESAREAN SECTION N/A 03/30/2020   Procedure: CESAREAN SECTION;  Surgeon:  [name], MD;  Location: [facility];  Service: Obstetrics;  Laterality: N/A;   COLONOSCOPY WITH PROPOFOL N/A 07/24/2017   Procedure: COLONOSCOPY WITH PROPOFOL;  Surgeon: Toledo, Boykin Nearing, MD;  Location: [facility];  Service: Gastroenterology;  Laterality: N/A;   ESOPHAGOGASTRODUODENOSCOPY (EGD) WITH PROPOFOL N/A 07/24/2017   Procedure: ESOPHAGOGASTRODUODENOSCOPY (EGD) WITH PROPOFOL;  Surgeon: Toledo, Boykin Nearing, MD;  Location: [facility];  Service: Gastroenterology;  Laterality: N/A;   IUD REMOVAL N/A 02/14/2021   Procedure: INTRAUTERINE DEVICE (IUD) REMOVAL;  Surgeon: [name], MD;  Location: [facility];  Service: Gynecology;  Laterality: N/A;   LAPAROSCOPIC BILATERAL SALPINGECTOMY Bilateral 02/14/2021   Procedure: LAPAROSCOPIC BILATERAL PARTIAL SALPINGECTOMY;  Surgeon: [name], MD;  Location:  [facility];  Service: Gynecology;  Laterality: Bilateral;   MIrena   02/2013   OB History     Gravida  3   Para  3   Term  3   Preterm  0   AB  0   Living  3      SAB  0   IAB  0   Ectopic  0   Multiple  0   Live Births  3          Social History   Tobacco Use   Smoking status: Never   Smokeless tobacco: Never  Substance Use Topics   Alcohol use: Not Currently   Social History   Substance and Sexual Activity  Sexual Activity Yes   Birth control/protection: Surgical   Comment: BTL    Immunization History  Administered Date(s) Administered   Tdap 01/27/2020     Review Of Systems  Constitutional: Denied constitutional symptoms, night sweats, recent illness, fatigue, fever, insomnia and weight loss.  Eyes: Denied eye symptoms, eye pain, photophobia, vision change and visual disturbance.  Ears/Nose/Throat/Neck: Denied ear, nose, throat or neck symptoms, hearing loss, nasal discharge, sinus congestion and sore throat.  Cardiovascular: Denied cardiovascular symptoms, arrhythmia, chest pain/pressure, edema, exercise intolerance, orthopnea  and palpitations.  Respiratory: Denied pulmonary symptoms, asthma, pleuritic pain, productive sputum, cough, dyspnea and wheezing.  Gastrointestinal: Denied, gastro-esophageal reflux, melena, nausea and vomiting.  Genitourinary: Reports occasional BV symptoms, particularly after intercoursDenied genitourinary symptoms including , pelvic relaxation issues, and urinary complaints.  Musculoskeletal: Denied musculoskeletal symptoms, stiffness, swelling, muscle weakness and myalgia.  Dermatologic: Denied dermatology symptoms, rash and scar.  Neurologic: Denied neurology symptoms, dizziness, headache, neck pain and syncope.  Psychiatric: Denied psychiatric symptoms, anxiety and depression.  Endocrine: Denied endocrine symptoms including hot flashes and night sweats.      Objective:    BP 120/78   Pulse 90   Ht 5\' 2"  (1.575 m)   Wt (!) 335 lb (152 kg)   LMP 11/17/2022   BMI 61.27 kg/m   Constitutional: Well-developed, well-nourished female in no acute distress Neurological: Alert and oriented to person, place, and time Psychiatric: Mood and affect appropriate Skin: No rashes or lesions Neck: Supple without masses. Trachea is midline.Thyroid is normal size without masses Lymphatics: No cervical, axillary, supraclavicular, or inguinal adenopathy noted Respiratory: Clear to auscultation bilaterally. Good air movement with normal work of breathing. Cardiovascular: Regular rate and rhythm. Extremities grossly normal, nontender with no edema; pulses regular Gastrointestinal: Soft, nontender, nondistended. No masses or hernias appreciated. No hepatosplenomegaly. No fluid wave. No rebound or guarding. Breast Exam: normal appearance, no masses or tenderness Genitourinary:  deferred    Rectal: deferred    Autumn Messing, CNM  11/26/22 10:21 AM

## 2022-11-26 NOTE — Assessment & Plan Note (Signed)
-   Reviewed health maintenance topics as documented below. - Cervical cancer screening pap smear WNL in 2022, due for repeat in 2025. - Offered STI screening, accepts swab, declines blood work.  - Reviewed options for heavy menstrual bleeding including uses of combined estrogen/progesterone contraception or LNG-IUD. She has previously not had a good experience with IUD. She feels that her periods are manageable at this time and she does not want to deal with possible side effects of hormones but is aware of options if she changes her mind.  - Reviewed use of boric acid for managing recurrent episodes of BV. Resources sent via MyChart.

## 2022-11-27 ENCOUNTER — Telehealth: Payer: Self-pay

## 2022-11-27 LAB — CERVICOVAGINAL ANCILLARY ONLY
Bacterial Vaginitis (gardnerella): POSITIVE — AB
Candida Glabrata: NEGATIVE
Candida Vaginitis: POSITIVE — AB
Chlamydia: NEGATIVE
Comment: NEGATIVE
Comment: NEGATIVE
Comment: NEGATIVE
Comment: NEGATIVE
Comment: NEGATIVE
Comment: NORMAL
Neisseria Gonorrhea: NEGATIVE
Trichomonas: NEGATIVE

## 2022-11-27 MED ORDER — FLUCONAZOLE 150 MG PO TABS: 150.0000 mg | ORAL_TABLET | ORAL | 3 refills | Status: AC

## 2022-11-27 MED ORDER — METRONIDAZOLE 500 MG PO TABS: 500.0000 mg | ORAL_TABLET | Freq: Two times a day (BID) | ORAL | 0 refills | Status: AC

## 2022-11-27 NOTE — Telephone Encounter (Signed)
Patient name and DOB verified. Reviewed positive BV and yeast results. Offered vaginal or oral antibiotics for BV. Patient desires oral treatment. Rx for metronidazole and diflucan sent to patient pharmacy. Reviewed precaution to avoid alcohol while taking antibiotic. Patient expressed understanding of instructions.

## 2022-11-27 NOTE — Addendum Note (Signed)
Addended by: Autumn Messing on: 11/27/2022 03:01 PM   Modules accepted: Orders

## 2024-01-15 ENCOUNTER — Other Ambulatory Visit: Payer: Self-pay

## 2024-01-15 MED ORDER — POLYSACCHARIDE IRON COMPLEX 150 MG PO CAPS
150.0000 mg | ORAL_CAPSULE | Freq: Every day | ORAL | 1 refills | Status: AC
  Filled 2024-01-15: qty 30, 30d supply, fill #0
  Filled 2024-02-11 – 2024-03-02 (×2): qty 30, 30d supply, fill #1
  Filled 2024-04-07: qty 30, 30d supply, fill #2
  Filled 2024-05-27: qty 30, 30d supply, fill #3

## 2024-01-16 ENCOUNTER — Other Ambulatory Visit: Payer: Self-pay

## 2024-01-16 MED ORDER — LISDEXAMFETAMINE DIMESYLATE 30 MG PO CAPS
30.0000 mg | ORAL_CAPSULE | Freq: Every morning | ORAL | 0 refills | Status: DC
  Filled 2024-01-16: qty 30, 30d supply, fill #0

## 2024-01-16 MED ORDER — AMPHETAMINE-DEXTROAMPHETAMINE 20 MG PO TABS
20.0000 mg | ORAL_TABLET | Freq: Every morning | ORAL | 0 refills | Status: DC
  Filled 2024-01-16: qty 30, 30d supply, fill #0

## 2024-01-17 ENCOUNTER — Other Ambulatory Visit: Payer: Self-pay

## 2024-02-11 ENCOUNTER — Other Ambulatory Visit: Payer: Self-pay

## 2024-02-21 ENCOUNTER — Ambulatory Visit

## 2024-02-21 DIAGNOSIS — R200 Anesthesia of skin: Secondary | ICD-10-CM

## 2024-02-21 DIAGNOSIS — M25532 Pain in left wrist: Secondary | ICD-10-CM | POA: Diagnosis not present

## 2024-02-21 DIAGNOSIS — M25531 Pain in right wrist: Secondary | ICD-10-CM

## 2024-02-21 NOTE — Patient Instructions (Signed)

## 2024-02-21 NOTE — Progress Notes (Signed)
 Orthopaedic Surgery New Patient Visit   History of Present Illness: The patient is a 33 y.o. right-hand-dominant female seen in clinic for bilateral hand pain.  Patient reports symptoms have been present for the last 3 or 4 years and have been getting worse as of late.  Associated symptoms include stiffness, burning and weakness.  She denies any inciting injury or trauma which preceded her symptoms.  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  Symptoms are also noticeable when writing and typing on the keyboard, and when trying to make a fist.  Admits to nocturnal symptoms as well.  She reports symptoms of burning and tingling in the fingers affect all digits on both hands.  Denies locking or catching of the fingers when flexing and extending.  She has trialed wrist braces during the day without relief.  Denies any diabetes history.  Last A1c 09/14/2021, was 5.8.  She believes she has iron  deficiency anemia and takes iron  supplements as a result.  Patient with ADHD.  Semirecent increase of Adderall XR from 10 mg to 20 mg, on 11/28/2022.  She states that she has not been back to her primary care doctor but is due for a checkup this year.    Past Medical, Social and Family History: Past Medical History:  Diagnosis Date   ADHD    Anxiety    BMI 50.0-59.9, adult (HCC)    Complication of anesthesia    has NV right before both csections   Depression    Family history of adverse reaction to anesthesia    mother but not sure what happened.   Headache    IBS (irritable bowel syndrome)    PONV (postoperative nausea and vomiting)    had after 1 of her c sections   Past Surgical History:  Procedure Laterality Date   CESAREAN SECTION     CESAREAN SECTION N/A 03/15/2019   Procedure: CESAREAN SECTION;  Surgeon: Arloa Lamar SQUIBB, MD;  Location: [facility];  Service: Obstetrics;  Laterality: N/A;   CESAREAN SECTION N/A 03/30/2020   Procedure: CESAREAN  SECTION;  Surgeon: Lake Read, MD;  Location: [facility];  Service: Obstetrics;  Laterality: N/A;   COLONOSCOPY WITH PROPOFOL  N/A 07/24/2017   Procedure: COLONOSCOPY WITH PROPOFOL ;  Surgeon: Toledo, Ladell POUR, MD;  Location: [facility];  Service: Gastroenterology;  Laterality: N/A;   ESOPHAGOGASTRODUODENOSCOPY (EGD) WITH PROPOFOL  N/A 07/24/2017   Procedure: ESOPHAGOGASTRODUODENOSCOPY (EGD) WITH PROPOFOL ;  Surgeon: Toledo, Ladell POUR, MD;  Location: [facility];  Service: Gastroenterology;  Laterality: N/A;   IUD REMOVAL N/A 02/14/2021   Procedure: INTRAUTERINE DEVICE (IUD) REMOVAL;  Surgeon: Lake Read, MD;  Location: [facility];  Service: Gynecology;  Laterality: N/A;   LAPAROSCOPIC BILATERAL SALPINGECTOMY Bilateral 02/14/2021   Procedure: LAPAROSCOPIC BILATERAL PARTIAL SALPINGECTOMY;  Surgeon: Lake Read, MD;  Location: [facility];  Service: Gynecology;  Laterality: Bilateral;   MIrena    02/2013   Allergies  Allergen Reactions   Nickel Rash   Current Outpatient Medications on File Prior to Visit  Medication Sig Dispense Refill   amphetamine -dextroamphetamine  (ADDERALL) 10 MG tablet Take 10 mg by mouth every morning.     amphetamine -dextroamphetamine  (ADDERALL) 20 MG tablet Take 1 tablet (20 mg total) by mouth every morning. 30 tablet 0   ferrous sulfate 325 (65 FE) MG EC tablet Take 325 mg by mouth 3 (three) times daily with meals.     fluconazole  (DIFLUCAN ) 150 MG tablet Take 1 tablet (150  mg total) by mouth every 3 (three) days. For three doses 3 tablet 3   fluticasone (FLONASE) 50 MCG/ACT nasal spray Place 2 sprays into both nostrils daily as needed for allergies or rhinitis.     ibuprofen  (ADVIL ) 600 MG tablet Take 1 tablet (600 mg total) by mouth every 6 (six) hours as needed for cramping or mild pain. (Patient not taking: Reported on 04/20/2021) 40 tablet 0   iron  polysaccharides (NIFEREX) 150 MG capsule Take 1 capsule (150 mg total) by mouth daily. 90 capsule 1    Multiple Vitamin (MULTIVITAMIN WITH MINERALS) TABS tablet Take 1 tablet by mouth daily.     norethindrone  (AYGESTIN ) 5 MG tablet Take 1 tablet (5 mg total) by mouth daily. (Patient not taking: Reported on 04/20/2021) 30 tablet 11   [DISCONTINUED] lisdexamfetamine (VYVANSE ) 30 MG capsule Take 1 capsule (30 mg total) by mouth every morning 30 capsule 0   No current facility-administered medications on file prior to visit.   Social History   Tobacco Use   Smoking status: Never   Smokeless tobacco: Never  Vaping Use   Vaping status: Never Used  Substance Use Topics   Alcohol use: Not Currently   Drug use: No      I have reviewed past medical, surgical, social and family history, medications and allergies as documented in the EMR.  Review of Systems - A ROS was performed including pertinent positives and negatives as documented in the HPI.     Physical Exam:  General/Constitutional: NAD Vascular: No edema, swelling or tenderness, except as noted in detailed exam Integumentary: No impressive skin lesions present, except as noted in detailed exam Neuro/Psych: Normal mood and affect, oriented to person, place and time Musculoskeletal: Normal, except as noted in detailed exam and in HPI   Focused examination:  Hand/Digit focused exam:  On gross examination of the bilateral upper extremities the skin is intact without erythema or ecchymosis.  Fingers warm and well perfused with 2+ radial pulse.  Appropriate capillary refill in all digits of bilateral hands. Sensation intact to the Median, Ulnar and Radial nerve distribution of the hand.  Subjective diminished sensation globally about all digits on bilateral hands.  AIN/PIN/Ulnar nerve motor function intact.  Positive tinels at the cubital tunnel for pain on the left elbow but no radicular symptoms in ulnar nerve distribution, negative Tinel's at the cubital tunnel on the right, positive tinels at the carpal tunnel bilaterally but  atypical numbness into all fingers.    Full flexion and extension of the fingers without active triggering.  Positive pain about the A1 pulley of the left thumb.    APB strength 5/5 with no signs of atrophy.    Negative pain about the basilar thumb joint.   Negative CMC grind.  Able to make a composite fist.          XR bilateral wrist imaging: X-rays of the bilateral wrists including PA, lateral, carpal tunnel views were obtained in office today and reviewed with patient.  Per my interpretation, there are no fractures or dislocations.  No other obvious osseous concerns.   Assessment:  Bilateral hand stiffness and numbness in all digits  Plan:  Patient was seen and examined in office today. We reviewed patient's history, examination, and imaging in detail. Based on information available for this encounter, patient with diffuse hand pain and paresthesias in all digits bilaterally.  Her presentation is atypical for cubital and carpal tunnel as provocative examination testing does not correlate with ulnar  and median nerve distributions respectively.  She does demonstrate some suggestion of nerve entrapment based on her history including nocturnal symptoms.  However, she reports similar numbness and burning in bilateral toes as well.  We believe there may be a systemic component to her presentation.  She has not undergone recent medical workup including blood work and other investigation that may contribute to her current symptomatology.  Nevertheless, there may be a component that does involve nerve entrapment.  We did discuss that from our standpoint we can further evaluate with nerve conduction studies and EMG testing.  We can also utilize diagnostic carpal tunnel injections.  However, patient would prefer to start her workup with her primary care provider.  After that workup has been completed, she would like to return if there is not an identifiable cause from a medical standpoint.  We also  suggested she try utilizing the wrist braces at nighttime to address nocturnal symptoms.  She will notify the office when she is ready for follow-up.   All questions, concerns and comments were addressed to the best of my ability.  Follow-up: After having completed medical workup   Arlyss GEANNIE Schneider, DO Orthopedic Surgery & Sports Medicine [place]   This document was dictated using Dragon voice recognition software. A reasonable attempt at proof reading has been made to minimize errors.

## 2024-02-24 ENCOUNTER — Other Ambulatory Visit: Payer: Self-pay

## 2024-02-27 ENCOUNTER — Telehealth: Payer: Self-pay | Admitting: Physician Assistant

## 2024-02-27 NOTE — Telephone Encounter (Signed)
 Patient seen by Maralee Gibbs last week, on Friday 02/21/24. 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 Patient walked over to discuss new onset of facial numbness/tingling with [facility] rad tech and physician assistant. Patient states began today. Alternating sides. Intermittent. Currently asymptomatic. Denies associated headache, slurred speech, confusion, fatigue/somnolence, extremity weakness, gait abnormality.   Patient was seen last week with several year history of bilateral hand and feet numbness/tingling. Patient was evaluated by cubital and/or carpal tunnel; but concern for requiring more extensive workup for neurological disorder (such as MS), vitamin deficiency, thyroid dysfunction, diabetic neuropathy, etc. was discussed. Patient was advised to follow-up with primary care provider.  Given new symptoms today. Advised patient call primary care provider's 24/7 nurse hotline and inform them of new facial numbness/tingling. Informed patient, suspect PCP office will recommend patient go to the ED for further evaluation.   Also, given new symptoms, called [facility] and discussed patient's situation. Stated concern for MS and believe patient would benefit from STAT evaluation (possible brain MRI and LP). Sent official referral through [facility].

## 2024-02-27 NOTE — Addendum Note (Signed)
 Addended by: Kourtni Stineman on: 02/27/2024 04:01 PM   Modules accepted: Orders

## 2024-03-02 ENCOUNTER — Other Ambulatory Visit: Payer: Self-pay

## 2024-03-02 DIAGNOSIS — R202 Paresthesia of skin: Secondary | ICD-10-CM | POA: Diagnosis not present

## 2024-03-02 DIAGNOSIS — R200 Anesthesia of skin: Secondary | ICD-10-CM | POA: Diagnosis not present

## 2024-03-03 ENCOUNTER — Other Ambulatory Visit: Payer: Self-pay | Admitting: Family Medicine

## 2024-03-03 DIAGNOSIS — R202 Paresthesia of skin: Secondary | ICD-10-CM

## 2024-03-03 DIAGNOSIS — R200 Anesthesia of skin: Secondary | ICD-10-CM | POA: Diagnosis not present

## 2024-03-09 ENCOUNTER — Encounter: Payer: Self-pay | Admitting: Radiology

## 2024-03-10 ENCOUNTER — Other Ambulatory Visit: Payer: Self-pay

## 2024-03-10 MED ORDER — AMPHETAMINE-DEXTROAMPHETAMINE 20 MG PO TABS
20.0000 mg | ORAL_TABLET | Freq: Every morning | ORAL | 0 refills | Status: DC
  Filled 2024-03-10: qty 30, 30d supply, fill #0

## 2024-03-11 DIAGNOSIS — E538 Deficiency of other specified B group vitamins: Secondary | ICD-10-CM | POA: Diagnosis not present

## 2024-03-18 ENCOUNTER — Other Ambulatory Visit

## 2024-03-25 DIAGNOSIS — E538 Deficiency of other specified B group vitamins: Secondary | ICD-10-CM | POA: Diagnosis not present

## 2024-04-07 ENCOUNTER — Other Ambulatory Visit: Payer: Self-pay

## 2024-04-08 DIAGNOSIS — E538 Deficiency of other specified B group vitamins: Secondary | ICD-10-CM | POA: Diagnosis not present

## 2024-04-10 ENCOUNTER — Encounter

## 2024-04-28 ENCOUNTER — Other Ambulatory Visit: Payer: Self-pay

## 2024-04-28 MED ORDER — AMPHETAMINE-DEXTROAMPHETAMINE 20 MG PO TABS
20.0000 mg | ORAL_TABLET | Freq: Every morning | ORAL | 0 refills | Status: AC
  Filled 2024-04-28: qty 30, 30d supply, fill #0

## 2024-05-01 ENCOUNTER — Ambulatory Visit: Admitting: Nurse Practitioner

## 2024-05-27 ENCOUNTER — Other Ambulatory Visit: Payer: Self-pay

## 2024-06-29 ENCOUNTER — Ambulatory Visit: Admitting: Orthopedic Surgery

## 2024-08-04 ENCOUNTER — Ambulatory Visit: Admitting: Family Medicine
